# Patient Record
Sex: Male | Born: 1960 | Race: White | Hispanic: No | Marital: Married | Smoking: Former smoker
Health system: Southern US, Community
[De-identification: ages and names within clinical notes are randomized; demographics above are authoritative.]

## PROBLEM LIST (undated history)

## (undated) DIAGNOSIS — Z960 Presence of urogenital implants: Secondary | ICD-10-CM

## (undated) DIAGNOSIS — I10 Essential (primary) hypertension: Secondary | ICD-10-CM

## (undated) DIAGNOSIS — F429 Obsessive-compulsive disorder, unspecified: Secondary | ICD-10-CM

## (undated) DIAGNOSIS — F32A Depression, unspecified: Secondary | ICD-10-CM

## (undated) DIAGNOSIS — F329 Major depressive disorder, single episode, unspecified: Secondary | ICD-10-CM

## (undated) DIAGNOSIS — J189 Pneumonia, unspecified organism: Secondary | ICD-10-CM

## (undated) DIAGNOSIS — G8190 Hemiplegia, unspecified affecting unspecified side: Secondary | ICD-10-CM

## (undated) DIAGNOSIS — I82409 Acute embolism and thrombosis of unspecified deep veins of unspecified lower extremity: Secondary | ICD-10-CM

## (undated) DIAGNOSIS — E785 Hyperlipidemia, unspecified: Secondary | ICD-10-CM

## (undated) DIAGNOSIS — R569 Unspecified convulsions: Secondary | ICD-10-CM

## (undated) DIAGNOSIS — Z978 Presence of other specified devices: Secondary | ICD-10-CM

## (undated) DIAGNOSIS — K219 Gastro-esophageal reflux disease without esophagitis: Secondary | ICD-10-CM

## (undated) DIAGNOSIS — I639 Cerebral infarction, unspecified: Secondary | ICD-10-CM

## (undated) DIAGNOSIS — N319 Neuromuscular dysfunction of bladder, unspecified: Secondary | ICD-10-CM

## (undated) DIAGNOSIS — D649 Anemia, unspecified: Secondary | ICD-10-CM

## (undated) HISTORY — PX: MULTIPLE TOOTH EXTRACTIONS: SHX2053

## (undated) HISTORY — PX: EYE SURGERY: SHX253

---

## 2002-09-22 ENCOUNTER — Encounter: Admission: RE | Admit: 2002-09-22 | Discharge: 2002-09-22 | Payer: Self-pay | Admitting: General Surgery

## 2002-09-22 ENCOUNTER — Encounter: Payer: Self-pay | Admitting: General Surgery

## 2002-11-01 ENCOUNTER — Encounter (HOSPITAL_BASED_OUTPATIENT_CLINIC_OR_DEPARTMENT_OTHER): Admission: RE | Admit: 2002-11-01 | Discharge: 2003-01-16 | Payer: Self-pay | Admitting: Internal Medicine

## 2003-01-31 ENCOUNTER — Encounter (HOSPITAL_BASED_OUTPATIENT_CLINIC_OR_DEPARTMENT_OTHER): Admission: RE | Admit: 2003-01-31 | Discharge: 2003-04-11 | Payer: Self-pay | Admitting: Internal Medicine

## 2003-09-19 ENCOUNTER — Encounter (HOSPITAL_BASED_OUTPATIENT_CLINIC_OR_DEPARTMENT_OTHER): Admission: RE | Admit: 2003-09-19 | Discharge: 2003-11-09 | Payer: Self-pay | Admitting: Internal Medicine

## 2004-01-01 ENCOUNTER — Encounter (HOSPITAL_BASED_OUTPATIENT_CLINIC_OR_DEPARTMENT_OTHER): Admission: RE | Admit: 2004-01-01 | Discharge: 2004-03-31 | Payer: Self-pay | Admitting: Internal Medicine

## 2004-04-02 ENCOUNTER — Encounter (HOSPITAL_BASED_OUTPATIENT_CLINIC_OR_DEPARTMENT_OTHER): Admission: RE | Admit: 2004-04-02 | Discharge: 2004-07-01 | Payer: Self-pay | Admitting: Internal Medicine

## 2005-02-05 ENCOUNTER — Encounter (HOSPITAL_BASED_OUTPATIENT_CLINIC_OR_DEPARTMENT_OTHER): Admission: RE | Admit: 2005-02-05 | Discharge: 2005-05-06 | Payer: Self-pay | Admitting: Surgery

## 2005-05-08 ENCOUNTER — Encounter (HOSPITAL_BASED_OUTPATIENT_CLINIC_OR_DEPARTMENT_OTHER): Admission: RE | Admit: 2005-05-08 | Discharge: 2005-08-06 | Payer: Self-pay | Admitting: Surgery

## 2006-02-01 ENCOUNTER — Encounter (HOSPITAL_BASED_OUTPATIENT_CLINIC_OR_DEPARTMENT_OTHER): Admission: RE | Admit: 2006-02-01 | Discharge: 2006-04-19 | Payer: Self-pay | Admitting: Surgery

## 2006-10-01 ENCOUNTER — Encounter (HOSPITAL_BASED_OUTPATIENT_CLINIC_OR_DEPARTMENT_OTHER): Admission: RE | Admit: 2006-10-01 | Discharge: 2006-12-09 | Payer: Self-pay | Admitting: Surgery

## 2006-10-06 ENCOUNTER — Ambulatory Visit (HOSPITAL_COMMUNITY): Admission: RE | Admit: 2006-10-06 | Discharge: 2006-10-06 | Payer: Self-pay | Admitting: Surgery

## 2007-02-20 ENCOUNTER — Inpatient Hospital Stay (HOSPITAL_COMMUNITY): Admission: EM | Admit: 2007-02-20 | Discharge: 2007-03-10 | Payer: Self-pay | Admitting: Emergency Medicine

## 2007-02-20 ENCOUNTER — Ambulatory Visit: Payer: Self-pay | Admitting: Critical Care Medicine

## 2007-03-07 ENCOUNTER — Ambulatory Visit: Payer: Self-pay | Admitting: Internal Medicine

## 2007-04-07 ENCOUNTER — Ambulatory Visit: Payer: Self-pay | Admitting: Physical Medicine & Rehabilitation

## 2007-04-07 ENCOUNTER — Inpatient Hospital Stay (HOSPITAL_COMMUNITY)
Admission: RE | Admit: 2007-04-07 | Discharge: 2007-05-19 | Payer: Self-pay | Admitting: Physical Medicine & Rehabilitation

## 2007-05-02 ENCOUNTER — Ambulatory Visit: Payer: Self-pay | Admitting: Vascular Surgery

## 2007-05-02 ENCOUNTER — Encounter (INDEPENDENT_AMBULATORY_CARE_PROVIDER_SITE_OTHER): Payer: Self-pay | Admitting: Physical Medicine & Rehabilitation

## 2007-05-05 HISTORY — PX: SUPRAPUBIC CATHETER INSERTION: SUR719

## 2007-05-05 HISTORY — PX: TRACHEOSTOMY: SUR1362

## 2007-06-21 ENCOUNTER — Observation Stay (HOSPITAL_COMMUNITY): Admission: RE | Admit: 2007-06-21 | Discharge: 2007-06-22 | Payer: Self-pay | Admitting: Urology

## 2007-08-24 ENCOUNTER — Ambulatory Visit: Payer: Self-pay | Admitting: Cardiovascular Disease

## 2007-08-25 ENCOUNTER — Ambulatory Visit (HOSPITAL_COMMUNITY): Admission: RE | Admit: 2007-08-25 | Discharge: 2007-08-25 | Payer: Self-pay | Admitting: Cardiovascular Disease

## 2007-08-25 ENCOUNTER — Encounter: Payer: Self-pay | Admitting: Cardiovascular Disease

## 2007-08-25 ENCOUNTER — Ambulatory Visit: Payer: Self-pay | Admitting: Cardiovascular Disease

## 2008-07-20 IMAGING — CR DG ABD PORTABLE 1V
1 series · 1 of 1 positions shown · non-contrast
Comparison: 03/01/07.

CLINICAL DATA: Ileus. 
 PORTABLE ABDOMEN ? 1 VIEW ? 05/07/07:

[view not recorded]
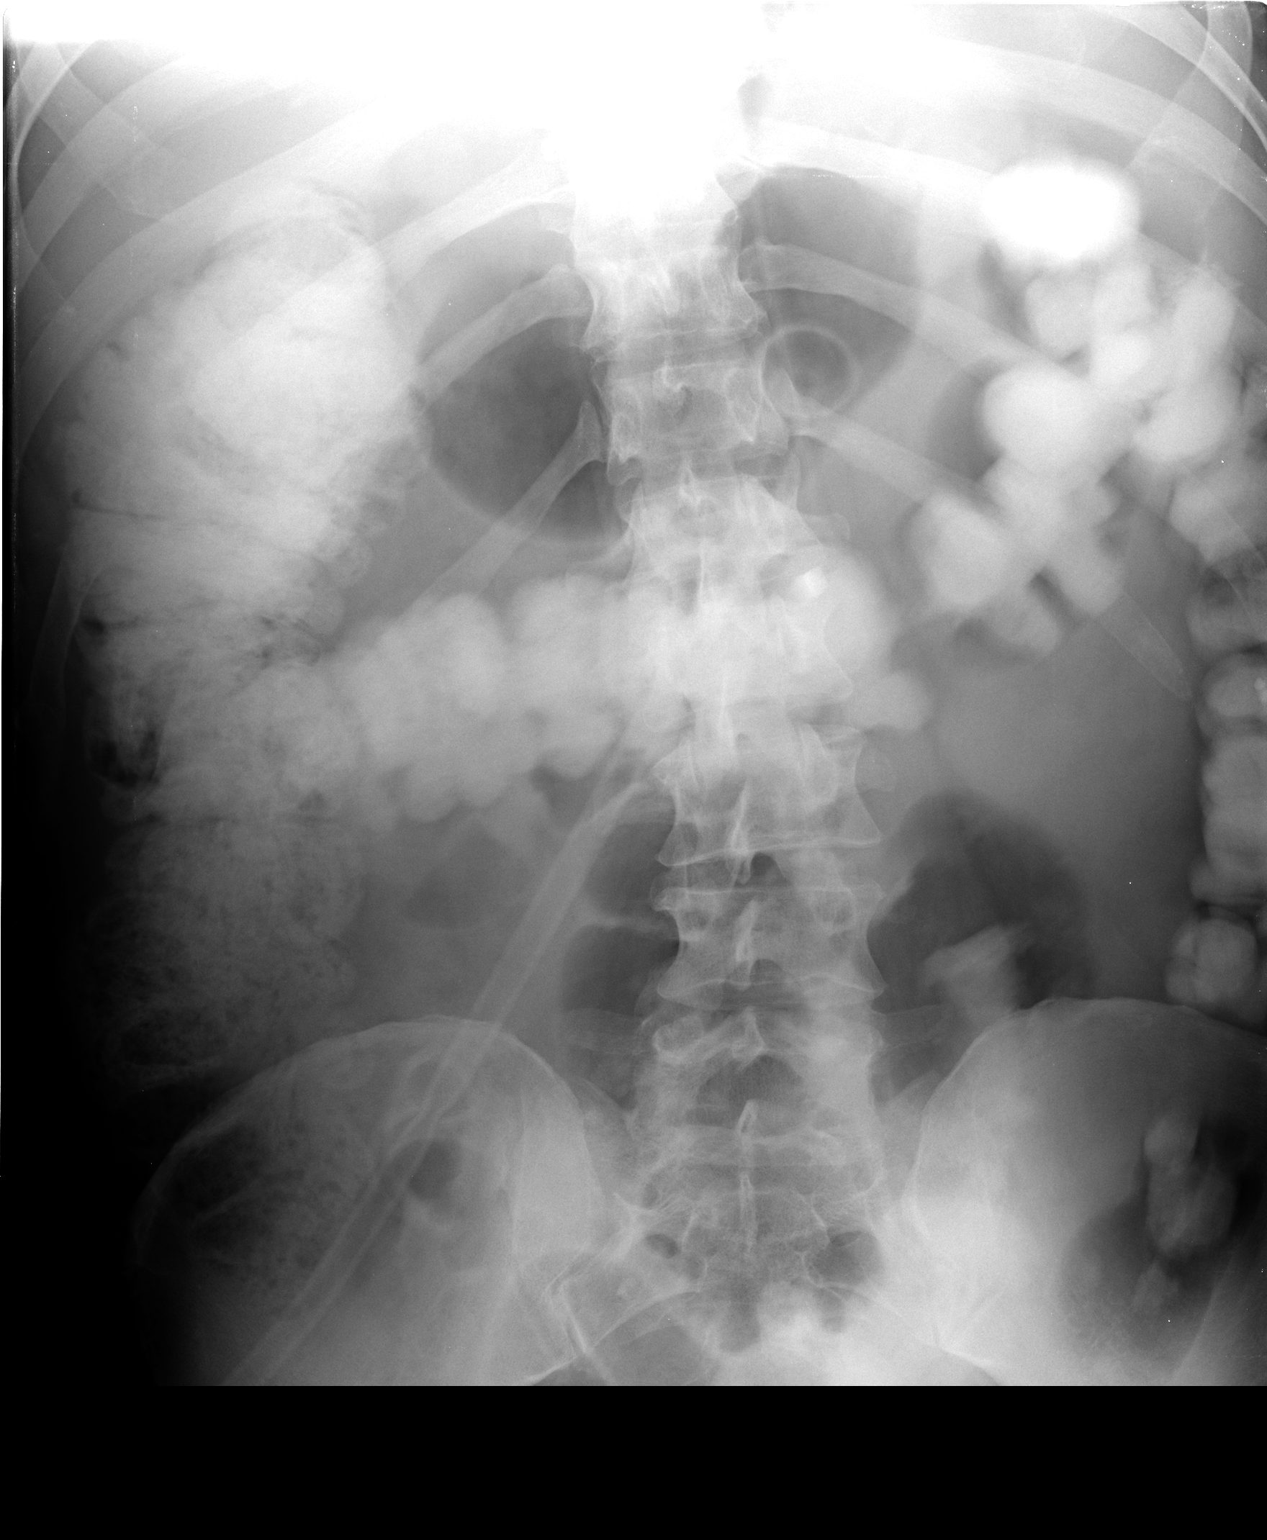

[1 of 1 positions shown; findings below may reference images not displayed]

FINDINGS: Gastrostomy tube is in place.  Oral contrast is seen throughout the colon.   No significant obstructive pattern or ileus identified.
IMPRESSION: Oral contrast in colon.  NO significant ileus or bowel obstruction.

## 2010-03-19 ENCOUNTER — Ambulatory Visit: Payer: Self-pay | Admitting: Vascular Surgery

## 2010-03-24 ENCOUNTER — Ambulatory Visit: Payer: Self-pay | Admitting: Vascular Surgery

## 2010-03-24 ENCOUNTER — Ambulatory Visit (HOSPITAL_COMMUNITY): Admission: RE | Admit: 2010-03-24 | Discharge: 2010-03-24 | Payer: Self-pay | Admitting: Vascular Surgery

## 2010-05-25 ENCOUNTER — Encounter: Payer: Self-pay | Admitting: Physical Medicine & Rehabilitation

## 2010-07-15 LAB — POCT I-STAT, CHEM 8
Calcium, Ion: 1.2 mmol/L (ref 1.12–1.32)
Creatinine, Ser: 0.9 mg/dL (ref 0.4–1.5)
Potassium: 4.2 mEq/L (ref 3.5–5.1)

## 2010-07-15 LAB — GLUCOSE, CAPILLARY
Glucose-Capillary: 215 mg/dL — ABNORMAL HIGH (ref 70–99)
Glucose-Capillary: 240 mg/dL — ABNORMAL HIGH (ref 70–99)
Glucose-Capillary: 243 mg/dL — ABNORMAL HIGH (ref 70–99)

## 2010-09-16 NOTE — Op Note (Signed)
[name], [name] NO.:  [id number]   MEDICAL RECORD NO.:  [id number]          PATIENT TYPE:  INP   LOCATION:  3108                         FACILITY:  [facility]   PHYSICIAN:  Nelda Bucks, MD DATE OF BIRTH:  1960/07/28   DATE OF PROCEDURE:  DATE OF DISCHARGE:                               OPERATIVE REPORT   PROCEDURE:  Percutaneous tracheostomy.  Consent was obtained from his  wife.  Aware of all risks and benefits associated with the procedure.   BLOOD LOSS WITH PROCEDURE:  4 mL.   BRONCHOSCOPIST FOR THE PROCEDURE:  Coralyn Helling, M.D.   VENTILATOR ADJUSTMENTS MADE:  Included taking the rate to 20 during use  of vecuronium for paralysis.  Oxygenation was increased FIO2 100%.   INDICATIONS FOR PROCEDURE:  Included 4 mg of Versed, 200 mcg of fentanyl  in divided doses, etomidate 20 mg, one-time dose  vecuronium 10 mg.   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   POSTOPERATIVE DIAGNOSIS:  Status post tracheostomy for the above  diagnosis.   PROCEDURE:  The patient was placed in a supine position.  His neck was  extended mildly with a shoulder wall.  The area was prepped with  chlorhexidine preparation over the tracheal rings.  Topical lidocaine  was used, 7 mL over the incision site.  A 1.5-cm vertical incision was  made over the third endotracheal rings.  Dissection was made on down  through the tracheal plains.  Dr. Coralyn Helling advanced the bronchoscope  through the ET tube and then pulled the ET tube back to approximately 20  cm.  Finder needle was then attempted at the third intratracheal space  and was visualized to be entering the ET tube.  The finder needle was  then withdrawn.  ET tube was taken back 1 cm to approximately 19 cm.  Then I placed an 18 gauge needle with a white sheath catheter into the  airway, which was properly visualized directly at the tip of the ET  tube.  The  catheter was then advanced without any posterior wall trauma  noted by Dr. Evlyn Courier bronchoscope.  Wire was inserted through the  catheter and the catheter sheath was removed.  A 14-French punch dilator  was then inserted over the wire and punched dilated directly into the  trachea, directly visualized without posterior wall injury.  A 14-French  dilator was then removed, followed by the addition of the Rhino dilator  to 37-French over glider.  This was done very easily.  Dilator was  removed and a glider remained over the wire.  An 8-French Shiley  tracheostomy over a 28-French dilator was inserted over the glider and  wire into the airway.  The wire and glider and 28-French dilator were  then removed and the tracheostomy remained.  The balloon was blown up  with 10 mL of air and the entire ventilatory setting and adapter were  transferred over to the new tracheostomy site and Dr. Craige Cotta entered the  new tracheostomy site with his bronchoscope and saw carina with  no active bleeding noted whatsoever.  The patient tolerated this  procedure very well.  Saturations ranging from 100 to 100%.  The patient  was normotensive throughout the procedure and his heart rate remained in  the 85s to 95 range.  Potable chest x-ray was taken and is pending for  review and family was updated about the procedure.      Nelda Bucks, MD  Electronically Signed     DJF/MEDQ  D:  03/01/2007  T:  03/02/2007  Job:  [id number]

## 2010-09-16 NOTE — H&P (Signed)
[name], [name] NO.:  [id number]   MEDICAL RECORD NO.:  [id number]          PATIENT TYPE:  IPS   LOCATION:  4025                         FACILITY:  [facility]   PHYSICIAN:  Ellwood Dense, M.D.   DATE OF BIRTH:  1960-12-13   DATE OF ADMISSION:  04/07/2007  DATE OF DISCHARGE:                              HISTORY & PHYSICAL   PRIMARY CARE PHYSICIAN:  [facility].   GI:  [place].   HISTORY OF PRESENT ILLNESS:  [name] is a 51 year old right-handed  Caucasian male with a history of non-insulin dependent diabetes mellitus  along with epilepsy.  He reportedly had been noncompliant with diabetes  management and questionably compliant with blood pressure medicines at  home.  The patient was admitted to [facility] February 20, 2007,  with an acute onset of right-sided weakness with initial BP of 205/105.  Initial cranial CT showed left basal ganglia hemorrhage with surrounding  edema, likely hypertensive in nature.  The patient was placed on  nicardipine drip.  Conservative care was recommended for the hemorrhagic  infarct.  His hospital course was significant for ventilatory-dependent  respiratory failure requiring and a trache March 01, 2007, and PEG  tube placement on that same date.  His hospital course was significant  for pneumonia, treated with antibiotics.  His latest cranial CT March 03, 2007, showed slight shift in the midline but otherwise stable.  Echocardiogram and carotid Dopplers were not performed during his  initial workup by neurology.   The patient was noted to have a hemoglobin A1c elevated at 11.8 and was  maintained on insulin therapy.  The plan is for aspirin therapy for  stroke prophylaxis approximately 1 month after discharge from the  hospital.   The patient was discharged to [facility] on March 09, 2007.  Subcu Lovenox was added for DVT prophylaxis prior to his discharge to  [facility].  He has  had a long history of diabetic foot ulcer, especially  on the right foot, and has been followed in the outpatient wound care  center prior to his stroke.  He was placed on IV vancomycin and Primaxin  along with fluconazole for methicillin-resistant Staphylococcus aureus-  positive sputum while at [facility].  His trache was discontinued April 05, 2007, and he has weaning oxygen by nasal cannula.   The patient was evaluated by the rehabilitation physicians and felt to  be an appropriate candidate for inpatient dilatation.   REVIEW OF SYSTEMS:  Positive for reflux.   PAST MEDICAL HISTORY:  1. Diabetes mellitus.  2. Diabetic foot ulcer.  3. Epilepsy.  4. Hypertension.  5. Dyslipidemia.  6. Chronic anemia.   FAMILY HISTORY:  Negative for stroke.   SOCIAL HISTORY:  The patient lives with his wife and their 36-year-old  son in [place].  They live in a 2-level home with the bedroom downstairs  with 7 steps to enter.  The patient does not use tobacco and used  alcohol occasionally.  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  He has good  family support and extended family.   FUNCTIONAL HISTORY PRIOR TO ADMISSION:  Independent and working.   ALLERGIES:  No known drug allergies.   MEDICATIONS PRIOR TO ADMISSION:  Wife to bring and from home.   LABORATORY:  Recent hemoglobin was 10.3 with hematocrit 30.1, platelet  count of 259,000, white count of 4.7.  Recent sodium was 145, potassium  3.4, chloride 106, bicarbonate 30, BUN 23 and creatinine 0.7 as of  April 04, 2007.   PHYSICAL EXAM:  A reasonably well-appearing adult male lying in bed with  very soft speech but alert and cooperative.  Blood pressure is 142/86 with pulse 77, respiratory rate 20 and  temperature 97.0.  HEENT: Normocephalic, nontraumatic.  CARDIOVASCULAR:  Regular rate and rhythm, S1-S2 without murmurs.  ABDOMEN:  Soft, slightly obese, nontender  with positive bowel sounds.  LUNGS:  Clear to auscultation bilaterally.  NEUROLOGIC:  Alert and oriented x3 with yes and no questions being  supplied.  He has a very soft voice but does have good comprehension.  He has poor oral motor abilities but slightly decreased sensation on the  right side.  Extraocular muscles appeared intact.   Left upper left and lower extremity strength were 4+/5.  Bulk and tone  were normal.  Reflexes were 2+ and symmetrical.  Right upper and right  lower extremity strength was 0-1/5 throughout.  Bulk was normal and tone  was decreased with slightly decreased sensation throughout the right  side.   IMPRESSION:  1. 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  2. Non-insulin-dependent diabetes mellitus.  3. History of epilepsy.  4. Recent history of recent ventilatory-dependent respiratory failure.  5. Severe dysphagia.   Presently the patient has deficits in ADLs, transfers, ambulation,  higher-level cognition, speech and swallowing related to the above-noted  left basal ganglia hemorrhage treated conservatively.   PLAN:  1. Admit to the rehabilitation unit rotation unit for daily therapies      to include physical therapy for range of motion, strengthening, bed      mobility, transfers, pre gait training, gait training and equipment      evaluation.  2. Occupational therapy for range of motion, strengthening, ADLs,      cognitive/perceptional training, splinting and equipment      evaluation.  3. Rehab nursing for skin care, wound care and bowel and bladder      training as necessary.  4. Speech therapy for oral motor exercises, evaluation of swallowing      and higher-level communication evaluation and treatment.  5. Case management to assess home environment, assist with discharge      planning and arrange for appropriate follow-up care.  6. Social work to assess family and social support, consultation      regarding  disability issues and assist in discharge planning.  7. Continue n.p.o. at present.  8. Check admission labs including CBC and CMET in a.m. April 08, 2007.  9. Tylenol 325 mg one to two tablets p.o. q.4h. p.r.n. for pain.  10.CBGs a.c. and h.s.  11.Draw blood from IV.  12.Routine turning to prevent skin breakdown.  13.Dulcolax suppository one per rectum daily p.r.n.  14.Keep heels off bed.  15.Check O2 saturations each shift and apply O2 for saturations less      than or equal to 90%.  16.Wound care nurse for sacral decubitus.  17.Mepilex with border to sacral ulcer, change each Monday, Wednesday  and Friday and p.r.n.  18.Keep Foley tube for now.  19.Clonazepam 0.25 mg per PEG tube daily.  20.Enalapril 10 mg per PEG daily.  21.Lantus insulin 37 units subcu q.h.s.  22.Glipizide 5 mg q.12h. per PEG.  23.Pepcid 20 mg per PEG daily.  24.Vitamin C 500 mg q.12h.  25.Valproic acid 599 mg per PEG q.6h.  26.Jevity 1.2 calories at 75 mL/hr. with bolus feeds per dietary      consult.  27.Glycopyrrolate 2 mg q.8h. per PEG tube.  28.Hand-held albuterol nebulizers 2.5 mg q.6h. as needed for shortness      of breath.  29.Dietary consult to adjust tube feedings.  30.Swallow study with speech therapy April 08, 2007.  31.Continue fentanyl patch 25 mcg per hour, change q.72h.   PROGNOSIS:  Fair.   ESTIMATED LENGTH OF STAY:  3-5 weeks.   GOALS:  Min assist ADLs, transfers, and moderate assist, ambulation,  with min assist wheelchair mobility, advancement diet as tolerated, and  improved cognition as able.           ______________________________  Ellwood Dense, M.D.     DC/MEDQ  D:  04/07/2007  T:  04/07/2007  Job:  [id number]

## 2010-09-16 NOTE — Assessment & Plan Note (Signed)
[facility]   NAME:  [name], [name]                ACCOUNT NO.:  [id number]   MEDICAL RECORD NO.:  [id number]      DATE OF BIRTH:  30-Dec-1960   PHYSICIAN:  Jake Shark A. Tanda Rockers, M.D. VISIT DATE:  11/16/2006                                   OFFICE VISIT   SUBJECTIVE:  [name] is a 50 year old man with a Wagner grade 3  diabetic foot ulcer, whom we have treated with total contact casting.  He returns for followup.  In the interim he denies excessive drainage  malodor pain or fever.   OBJECTIVE:  Blood pressure is 140/83, respirations 20, pulse rate 90,  temperature 97.7.  Capillary blood glucose was 340 mg percent.  Inspection of the right fifth met head shows that the wound is  completely resolved.  There is a thin in layer of epithelium covering  the complete wound.  There is no fluctuance.  There is no evidence of  infection.  The pedal pulse remains intact.  The neurologic exam continues to be abnormal.   ASSESSMENT:  Resolved Wagner grade 3 diabetic foot ulcer.   PLAN:  We have given the patient a prescription and referral to [facility]  for custom inserts and orthotics.  He is going directly there now for  molding.  We will reevaluate him in 2 weeks.  In the interim he is to  wear an offloading Darco healing sandal.  He will continue routine  diabetic foot monitoring as he has been previously instructed and  reviewed.  We have given the patient an opportunity to ask questions.  He seems to comprehend the fore-stated concerns and indicates that he  will be compliant.      Harold A. Tanda Rockers, M.D.  Electronically Signed     HAN/MEDQ  D:  11/16/2006  T:  11/16/2006  Job:  [id number]

## 2010-09-16 NOTE — Discharge Summary (Signed)
[name], [name]                ACCOUNT NO.:  [id number]   MEDICAL RECORD NO.:  [id number]          PATIENT TYPE:  INP   LOCATION:  3108                         FACILITY:  [facility]   PHYSICIAN:  Melvyn Novas, M.D.  DATE OF BIRTH:  09-30-1960   DATE OF ADMISSION:  02/20/2007  DATE OF DISCHARGE:  03/09/2007                               DISCHARGE SUMMARY   DIAGNOSES AT THE TIME OF DISCHARGE:  1. 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  2. Hypertension.  3. Diabetes.  4. Epilepsy since childhood with last seizure greater than 20 years      ago.  5. Hyperlipidemia.  6. Dysphagia secondary to stroke status post percutaneous endoscopic      gastrostomy.  7. Ventilator-dependent respiratory failure status post tracheotomy,      remains on ventilator at the time of discharge.  8. Hyponatremia.  9. Anemia.  10.Pneumonia status post full course of antibiotics.   MEDICINES AT TIME OF DISCHARGE:  1. Lovenox 40 mg subcutaneous daily.  2. Saline lock.  3. Peridex oral rinse twice a day while on ventilator.  4. Protonix 40 mg a day.  5. Valproate 500 mg q.6h.  6. Tylenol 650 mg q.6h.  7. Sliding scale insulin q.4h.  8. Lantus 50 units q.12h.  9. Jevity tube feeding of 80 mL an hour via PEG.  10.Normal saline at 20 mL __________  11.Free water 100 mL q.6h.   STUDIES PERFORMED:  1. CT of the head on admission shows a left basal ganglion hemorrhage      with a small amount of surrounding edema, likely hypertensive in      nature.  2. CT angiogram on admission shows interval increased size of      parenchymal hemorrhage at the left lentiform nuclei now measuring      6.1 x 2.8 cm with surrounding edema and 6.6 mm rightward shift. No      extra-axial hemorrhage or ventriculomegaly. Otherwise normal.  3. CT of the head at 24 hours shows stable hemorrhage.  4. CT of the head at 72 hours shows  stable hemorrhage.  5. CT on the 25th shows continued left basal ganglion hemorrhage with      edema and mild mass effect. Otherwise stable.  6. Last CT of the head performed on October 30 again showed slightly      increased rightward midline shift from 4.1 to 6.1 mm, otherwise      stable and normal evolutionary changes.  7. Chest x-ray with left lower lobe atelectasis, intubated on      ventilator; that was on admission, and by discharge, there was      interval increase in left retrocardiac opacity representing      atelectasis versus infiltrate, stable tubes and lines and mild      central venous pulmonary congestion similar to the prior. In the      middle of these 2 chest x-rays, there was an x-ray with pneumonia      that has cleared.  8. Abdominal x-ray performed October 27 showed orogastric tube      terminating in pylorus. No other abnormalities.  9. Two-D echocardiogram not performed.  10.Carotid Doppler not performed.   LABORATORY STUDIES:  Last CBC performed November 3 with hemoglobin 9.8,  hematocrit 29.6, white blood cells 7.2 and platelets 222. Clostridium  difficile was negative x3. Last chemistry performed on November 5 with  sodium 146, potassium 3.4, glucose 127, calcium 8.1 - otherwise normal.  Last urinalysis performed on October 30 shows 0 to 2 red blood cells, 0  to 2 white blood cells. Blood cultures October 24 showed no growth x2.  Anemia panel with red blood cells 3.7, reticulocytes 18.5, iron 27,  total iron binding capacity 1.65, percent saturation 16, UIBC 138 and  vitamin B12 618. Her folate is 13.1, ferritin 816. Last Depakote level  October 27 34.8. Hemoglobin A1c 11.9.   HISTORY OF PRESENT ILLNESS:  [name] is a 50 year old right-  handed Caucasian male who had acute onset right sided weakness at church  at approximately 6:30 p.m. the day of admission. The patient was brought  to the emergency room via EMS. CT of the brain revealed an acute  20-mm  left basal ganglion hemorrhage felt to be secondary to hypertension as  the patient has a history of hypertension, and blood pressure in the ED  was 205/105. The patient was placed on nicardipine drip and placed in  the intensive care unit. CT angiogram of the brain was performed to  assess for active hemorrhage which was negative. He was not a TPA  candidate secondary to hemorrhage.   HOSPITAL COURSE:  The patient remained in the ICU from admission until  discharge. He was initially on a nicardipine drip, but that has been  controlled. He was initially placed on his blood pressure medicine which  he had taken since March of this year per his pharmacy records, but it  eventually was not needed with the blood pressures in the 120s/60s. He  remains on the ventilator at this time. We attempted ventilator weaning.  The patient just could not secondary to the continued edema. Expect it  will take a period but will eventually be able to wean. Critical care  medicine followed him for this during hospitalization. Tracheostomy was  placed by Dr. Tyson Alias at the bedside during his hospitalization as was  a PEG placed for tube feeding by [facility]. Both of these events were  uneventful. The patient does have a significant history of diabetes with  elevated hemoglobin A1c on admission of 11.8. Glucoses were difficult to  control, being on and off insulin drips several times during the  hospital stay. He is now on 50 units of Lantus q.12h. and seems to be  much better controlled. Sliding scale insulin continues on a q.4h.  basis. The patient will eventually need aspirin for secondary stroke  prevention but typically do not start this for at least one month  following a hemorrhage of this size. As of the day of discharge, he is  safe to be placed on Lovenox subcutaneous tissue for DVT prophylaxis  post PEG and tracheostomy placement. He apparently had some diarrhea  over the weekend, and this  has resolved. His clostridium difficile were  negative x3. During the course of hospitalization, he had some pneumonia  which has been treated with a full course of antibiotics. The patient is  now stable from the neurological standpoint and is safe for discharge to  __________ which his insurance company has agreed to.   CONDITION ON DISCHARGE:  The patient is alert. He follows occasional  commands. He has a left gait preference and a right lower facial  weakness. He has dense right hemiparesis and moves his left side well.  Left lower extremity is 2/5 weak. His heart rate is regular. His breath  sounds are clear. His abdomen is nondistended, nontender.   DISCHARGE PLAN:  1. Transfer to [facility] for continuation of ventilator weaning and      rehabilitation.  2. Consider aspirin 4 weeks from hemorrhage onset for stroke      prevention.  3. Continue aggressive risk factor control including systolic blood      pressure less than 130, hemoglobin A1c less than 6.5 and LDL less      than 100.  4. Monitor sodium and adjust as needed.  5. Plan eventual discharge home with wife.  6. Follow up with Dr. Delia Heady in 2 to 3 months or as able.      Annie Main, N.P.      Melvyn Novas, M.D.  Electronically Signed    SB/MEDQ  D:  03/09/2007  T:  03/09/2007  Job:  [id number]   cc:   Pramod P. Pearlean Brownie, MD  [facility]  Critical Care Medicine

## 2010-09-16 NOTE — H&P (Signed)
[name], SHIN NO.:  [id number]   MEDICAL RECORD NO.:  [id number]          PATIENT TYPE:  INP   LOCATION:  2305                         FACILITY:  [facility]   PHYSICIAN:  Deanna Artis. Hickling, M.D.DATE OF BIRTH:  [birth year]   DATE OF ADMISSION:  02/20/2007  DATE OF DISCHARGE:                              HISTORY & PHYSICAL   TIME:  1930   CHIEF COMPLAINT:  Right hemiparesis.   HISTORY OF PRESENT ILLNESS:  The patient is a 50 year old Caucasian male  who is noted to have acute onset right-sided weakness at church at  approximately 6:30 p.m. today.  The patient was brought to the emergency  department via EMS.  CT scan of the brain at that time revealed an acute  20 mm left basal ganglia hemorrhage.   PAST MEDICAL HISTORY:  Includes:  1. Diabetes.  2. Diabetic foot ulcer.  3. Epilepsy since childhood which is well controlled with Depakote,      the last seizure was greater than 20 years ago.  4. Hypertension.  5. Hyperlipidemia.   MEDICATIONS:  Outpatient medications are unknown other than Depakote.  The patient's wife indicates that the patient is rather secretive with  regard to his medical conditions.  She does, however, indicate that she  knows he takes Depakote.   ALLERGIES:  LISTED AS NO KNOWN DRUG ALLERGIES.   FAMILY HISTORY:  Denied for a history of stroke or epilepsy according to  the patient's wife.   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  He does not consume or illicit drugs.  He does consume  alcohol on occasion.   REVIEW OF SYSTEMS:  Somewhat limited as the patient is essentially  aphasic, however, by history the patient became acutely right  hemiparetic while ambulating in church.  The was no antecedent chest  pain or shortness of breath.   PHYSICAL EXAMINATION:  GENERAL:  The patient is able to awaken to verbal  stimuli.  He does not follow any commands.  He does not appear to be in  any acute  distress.  VITAL SIGNS:  Temperature 97.3, blood pressure 205/105, heart rate in  the 80s, respiratory rate 16, O2 saturation 100% on room air.  HEENT:  The patient is normocephalic and atraumatic. Oral mucosa is  moist.  There are no tongue lacerations present.  NECK:  Supple in all planes of motions.  HEART:  Regular with a 2/6 systolic ejection murmur.  LUNGS:  Clear to auscultation bilaterally.  ABDOMEN:  Soft and nondistended.  SKIN: Without any rash.  EXTREMITIES:  Without any edema or cyanosis.  NEUROLOGIC:  The patient's eyes are initially closed.  He does awaken to  verbal stimuli.  At times he makes incomprehensible sounds.  He will not  follow any commands.  His pupils are 3 mm on the left and 4mm on the  right. The right pupil is nonreactive. The right pupil is unreactive.  The left pupil is somewhat ovoid in shape.  The patient does have roving  eye movements.  No gaze  preference is identified.  The patient with  cough and gag reflex.  Corneals are intact bilaterally.  With noxious  stimulation the patient has flexor posturing of the right upper  extremity and trace movement in the right lower extremity.  He does  withdraw to noxious stimulus on the left and localizes briskly  especially with the left upper extremity.  Spontaneous movements are  also appreciated in the left upper and lower extremities.  Reflexes are  2-/4 in the left upper extremity specifically with the biceps and  brachioradialis. On the right, the patient has 2/4 reflexes.  Babinski  response is downgoing bilaterally.  Sensory exam reveals the patient to  not withdraw to pin stimulation to either lower extremity, however, this  may be related to a potential diabetic neuropathy.   IMAGING:  CT imaging of the brain performed in the emergency department  without IV contrast revealed a left basal ganglial hemorrhage of  approximately 20 mL.  There was minimal midline shift appreciated at  that time.  There  is no intraventricular hemorrhage identified.  There  is no evidence of acute hydrocephalus at this time.   LABORATORY DATA:  Sodium 139, potassium 4.1, chloride 105, CO2 29, BUN  16, creatinine 0.6, glucose 256, white blood cell count 4.5, hemoglobin  13.3, hematocrit 38.9, platelet count of 150.  PT 12.6, INR 0.9, PTT 23.   IMPRESSION:  1. The patient is a 50 year old Caucasian male noted to have a      spontaneous left basal ganglial hemorrhage approximately 20 mL with      minimal midline shift.  Neuro exam reveals somnolent nonverbal      patient with right pupillary dilatation which is nonreactive,      suspect hypertension as primary etiology.  Less likely underlying      vascular abnormality.  2. History of epilepsy, well controlled.  3. Other medical conditions including diabetes, diabetic foot ulcer,      hypertension, hyperlipidemia.   PLAN:  1. Will start nicardipine to maintain systolic blood pressure less      than 160.  2. Arterial line for continuous blood pressure monitoring.  3. CT angiogram of the brain to assess for active hemorrhage as well      as to assess for underlying vascular abnormality.  4. Will provided Depacon 500 mg IV times 1 as it is unclear as to when      the patient last took his Depakote.  Additionally it is unclear as      to the dose of the patient's medication.  5. The patient's wife to bring him list of patient's home meds.  6. Will have detailed family discussion.  7. Will admit the patient to the intensive care unit following CT      angiogram results.  8. Repeat CT Brain without IV contrast in AM      Suzzette Righter, DO   Electronically Signed     ______________________________  Deanna Artis. Sharene Skeans, M.D.    RH/MEDQ  D:  02/21/2007  T:  02/21/2007  Job:  [id number]

## 2010-09-16 NOTE — Discharge Summary (Signed)
[name], [name] NO.:  [id number]   MEDICAL RECORD NO.:  [id number]          PATIENT TYPE:  IPS   LOCATION:  4030                         FACILITY:  [facility]   PHYSICIAN:  Mariam Dollar, P.A.  DATE OF BIRTH:  05/03/1961   DATE OF ADMISSION:  04/07/2007  DATE OF DISCHARGE:  04/17/2008                               DISCHARGE SUMMARY   DISCHARGE DIAGNOSES:  1. Left basal ganglia hemorrhagic infarction.  2. Dysphagia secondary to cerebrovascular accident.  3. Gastrostomy feeding tube for nutritional support.  4. Insulin-dependent diabetes mellitus.  5. Tracheostomy - decannulated.  6. Epilepsy.  7. Hypertension.  8. Sacral and buttocks pressure ulcers - resolved.  9. Nonocclusive deep vein thrombosis common femoral vein right lower      extremity and subcutaneous Lovenox for deep vein thrombosis      prophylaxis   HISTORY OF PRESENT ILLNESS:  This is a 50 year old right-handed white  male with history of non-insulin dependent diabetes mellitus and  epilepsy who was admitted [facility] February 20, 2007 with  right-sided weakness.  Blood pressure 205, diastolic 105.  Cranial CT  scan showed a left basal ganglia hemorrhage with surrounding edema,  likely hypertensive in nature.  Placed on nicardipine drip.  Conservative care of hemorrhagic infarction.  Hospital course with  respiratory failure.  Tracheostomy performed October 28 as well as  gastrostomy feeding tube.  Complete a course of antibiotics for  pneumonia.  Latest follow-up cranial CT scan October 30 showed slight  midline shift but otherwise stable.  Echocardiogram, carotid Dopplers  not performed during workup by Neurology.  Noted hemoglobin A1c of 11.8,  maintained on insulin therapy.  Plan was for aspirin therapy for CVA  prophylaxis in one month.  The patient was discharged to [facility] March 09, 2007.  Subcutaneous Lovenox was initiated for deep  vein thrombosis prophylaxis.   Patient with long history of diabetic foot  ulcers had been followed at outpatient wound center in the past.  He had  been placed on intravenous vancomycin for MRSA while at [facility].  Tracheostomy removed April 05, 2007 and weaned from oxygen  therapy.   PAST MEDICAL HISTORY:  1. Diabetes mellitus with poor compliance.  2. Diabetic foot ulcers.  3. Epilepsy.  4. Hypertension.  5. Hyperlipidemia.  6. Chronic anemia.   HABITS:  Denies tobacco.  Occasional alcohol.   SOCIAL HISTORY:  Lives with his wife and 57-year-old son in [place].  Two-  level home, bedroom downstairs.  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   MEDICATIONS PRIOR TO ADMISSION:  Unknown.   ALLERGIES:  None.   REHABILITATION HOSPITAL COURSE:  The patient was admitted to inpatient  rehab services with therapies initiated on a 3-hour daily basis  consisting of physical therapy, occupational therapy, speech therapy and  rehabilitation nursing.  The following issues were addressed during the  patient's rehabilitation stay.  Pertaining to Mr. Vanderloop left basal  ganglia hemorrhagic infarction, remained stable.  Aspirin therapy had  since been initiated 81 mg daily at the recommendations of  neurology  services.  Functionally, he was moderate to max assist for sliding board  basic transfers, minimal assist for dynamic sitting.  He was aphasic but  was able to communicate his needs better during his rehabilitation stay.  He was able to use yes/no as well as two to three word responses for  basic naming.  He was minimum to moderate assist for activities of daily  living.  He remained on subcutaneous Lovenox for deep vein thrombosis  prophylaxis throughout his rehab course.  Noted on December 29 Doppler  studies of lower extremities did show a nonocclusive deep vein  thrombosis in the common femoral vein.  No change was made to his  medications as he did remain on subcutaneous Lovenox  but only at time of  discharge and would continue with aspirin therapy.  He was followed by  speech therapy for dysphagia.  Gastrostomy tube feeds were ongoing.  His  diet was slowly advanced to a mechanical soft thin liquid diet.  He was  still getting PEG tube feeds at 0600 and 2200 hours and water per PEG  tube had since been stopped.  Blood sugars were monitored.  He is  continued on Lantus insulin as well as Glucotrol.  Latest blood sugars  158, 98 and 96.  He did have a history of epilepsy.  He remained on  valproate 500 mg every 6 hours.  Blood pressures controlled with Vasotec  10 mg daily.  The patient did have some ongoing discomfort of Foley tube  with some mild erosion at the penis.  Urology services was consulted.  Initial attempts to remove Foley catheter tube, and the patient did not  void.  Later a coude catheter was placed which did seem to aid in his  overall comfort as this was anchored down tightly as not to cause any  further irritation.  Due to his limited functional status.  The fact  that his wife continued to work, it was felt skilled nursing facility  would be needed with bed becoming available on May 19, 2007, and  discharge taking place at that time.   DISCHARGE MEDICATIONS:  At time of dictation included:  1. Vasotec 10 mg p.o. daily.  2. Pepcid 20 mg p.o. daily.  3. Vitamin C 500 mg p.o. b.i.d.  4. Robinul 2 mg p.o. q.8 hours.  5. Duragesic patch 25 mcg change every 72 hours.  6. Aspirin 81 mg p.o. daily.  7. Glipizide 10 mg p.o. b.i.d.  8. Lantus insulin 34 units subcutaneously at bedtime.  9. Darvocet N-100 one tablet every 6 hours as needed pain.  10.Valproate 500 mg p.o. every 6 hours.  11.Lantus insulin was to be at 14 units subcutaneously every a.m. as      well as the 34 units at bedtime dictated earlier.  12.His subcutaneous Lovenox was discontinued at time of discharge.      Noted follow-up at time of discharge by      speech therapy that  his diet had since been advanced to a regular      consistency, thus all tube feeds were discontinued as well as any      water by PEG tube other than for routine flushing of tube.  It was      felt that if his nutritional support remained good that is peg tube      could be removed.  He should be on a diabetic diet.      Daniel Angiulli, P.A.  DA/MEDQ  D:  05/18/2007  T:  05/18/2007  Job:  [id number]   cc:   Ellwood Dense, M.D.  [facility]  [facility]

## 2010-09-16 NOTE — Op Note (Signed)
[name], [name]                ACCOUNT NO.:  [id number]   MEDICAL RECORD NO.:  [id number]          PATIENT TYPE:  OBV   LOCATION:  A301                          FACILITY:  [facility]   PHYSICIAN:  Ky Barban, M.D.DATE OF BIRTH:  [birth year]   DATE OF PROCEDURE:  06/21/2007  DATE OF DISCHARGE:                               OPERATIVE REPORT   PREOPERATIVE DIAGNOSIS:  Neurogenic bladder.   POSTOPERATIVE DIAGNOSIS:  Neurogenic bladder.   PROCEDURE:  Insertion of suprapubic catheter.   ANESTHESIA:  General.   PROCEDURE:  The patient under general anesthesia after usual prep and  drape, suprapubic incision about 2 inches long in the midline is made,  carried down through the subcutaneous tissue.  The rectus sheath was  incised.  The recti separated in the midline.  The retropubic space was  entered and self-retaining Turner Warwick retractor was applied.  The  bladder was filled up through the Foley catheter which already has 300  mL of saline.  Inferior surface of the bladder is identified.  Bladder  was aspirated with #18 needle to confirm the position of the bladder.  Then two holding stitches using 2-0 Vicryl were placed in the anterior  wall of the bladder.  In between these two holding stitches, incision  with the help of the cautery was made, bladder was opened and #26 Foley  catheter is inserted as a cystotomy tube.  The margins of the cystotomy,  they were anchored to the rectus sheath on each side with already placed  these stitches on each side.  The rectus sheath was closed with  interrupted sutures of 0-0 Vicryl and after passing the holding stitches  of cystotomy with a free needle into the rectus sheath they were also  tied so the bladder is pulled directly against the rectus sheath.  Once  it was closed, the prevesical space was drained with quarter inch  Penrose drain.  Skin was closed with staples.  Cystotomy tube stabilized  to the skin with a 0-0 silk stitch.   Sterile gauze dressing applied  after stapling the wound.  The Foley catheter was removed.  The patient  left the operating room in satisfactory condition.      Ky Barban, M.D.  Electronically Signed     MIJ/MEDQ  D:  06/21/2007  T:  06/22/2007  Job:  [id number]   cc:   Prescott Parma  Fax: [phone]

## 2010-09-16 NOTE — Assessment & Plan Note (Signed)
[facility]   NAME:  ALIC, HILBURN NO.:  [id number]   MEDICAL RECORD NO.:  [id number]      DATE OF BIRTH:  [birth year]   PHYSICIAN:  Maxwell Caul, M.D. VISIT DATE:  11/26/2006                                   OFFICE VISIT   Mr. Astorga is a 50 year old man with a Wagner's grade 3 diabetic foot  ulcer who had been treated with total contact casting.  When he was last  seen here the wound essentially resolved.  He was sent back to [facility]  for custom inserts and adjustment of his orthotics.  We are seeing him  today in follow-up.   EXAMINATION:  He is afebrile.  His blood sugar was 266 today.  The area  over his right fifth metatarsal head is completely closed over.  There  is some mild callus over this, however no open area was seen.  I have  looked at his shoes, they seem to be quite adequate.  There is good  width here, I think everything is fine.   PLAN:  I have told him to apply Allevyn adhesive or protective area to  help with any friction that still was present within his orthotics and  his orthotic shoes, otherwise think he is safe for discharge.  He can be  followed up for his diabetes at his family practice.  There are no open  wounds here.  He inspects his feet daily now.           ______________________________  Maxwell Caul, M.D.     MGR/MEDQ  D:  11/26/2006  T:  11/26/2006  Job:  [id number]

## 2010-09-16 NOTE — Assessment & Plan Note (Signed)
[facility]   NAME:  [name], [name]                ACCOUNT NO.:  [id number]   MEDICAL RECORD NO.:  [id number]      DATE OF BIRTH:  May 07, 1960   PHYSICIAN:  Jake Shark A. Tanda Rockers, M.D. VISIT DATE:  11/02/2006                                   OFFICE VISIT   .   SUBJECTIVE:  [name] is a 50 year old man whom we are evaluating with  a Wagner grade 3 diabetic foot ulcer.  In the interim he has worn a  total contact cast.  He presents for reevaluation.   OBJECTIVE:  Blood pressure is 140/88, respirations 16, pulse rate 95,  temperature is 98.1.  The patient has not checked his capillary blood  glucose since his last visit.  Inspection of the right fifth met head shows that there is complete  granulation overlying the previous ulcer.  The wound does not extend to  the joint at this time there is no excessive edema.  There is no  evidence of a send cellulitis or abscess.  A Q-tip was used to debride  an area of hypergranulation.  Hemorrhage was controlled with direct  pressure.   ASSESSMENT:  Clinical improvement with total contact casting for  offloading.   PLAN:  We will place the patient in a total contact cast with a  Silverlon swath and a SofSorb.  We will reevaluate him in 2 weeks.  We  have encouraged the patient to resume his diabetic management, including  capillary blood glucose monitoring and treatment per his internist.  In  addition, we have suggested that he make an appointment with his primary  care physician at [facility].      Harold A. Tanda Rockers, M.D.  Electronically Signed     HAN/MEDQ  D:  11/02/2006  T:  11/02/2006  Job:  [id number]   cc:   Paulita Cradle

## 2010-09-16 NOTE — Assessment & Plan Note (Signed)
[name]                       [name] NOTE   NAME:[name], [name]                       MRN:          [id number]  DATE:08/24/2007                            DOB:          1960/11/09    [name] was seen today at the request of [name].  He is 50 years  old.  Unfortunately, he suffered a hemorrhagic stroke in October of this  past year.  It happened while he was at church.  Apparently, there was  an angiogram done, and he had no aneurysm.  Unfortunately, it has been a  devastating stroke.  He has dysphasia.  Had a gastrostomy tube until  recently.  He has a suprapubic catheter.  Had a tracheostomy.  He has  had seizures on therapy with a sacral decubitus and a nonocclusive DVT  to the right lower extremity.   We are asked to see him for fluctuating blood pressures during OT and  PT.  I explained to the wife that this is fairly common after a CVA.  Particularly now, it appears that the patient has some diabetes.  His  postural reflexes are slowed from his stroke and autonomic dysfunction  from his diabetes.  I told her there is no easy solution to this.  Apparently, his blood pressure really has not been high since the  stroke.  She tells me that it shot up to over 200, and they thought he  had a hypertensive hemorrhagic stroke.  However, at rehab, it does not  appear to be that bad.  The issue has to do with getting up and having  his blood pressure fall to the 80 systolic level and having him feel  tired and weak.   I told her for the time being we would cut his Vasotec back to 5 mg a  day and then have him stop it in 2 weeks.  So long as his systolic  pressures stay under 150, I do not think that he needs any blood  pressure pills.   The patient's review of systems is remarkable for no significant chest  pain.  He has not had a history of cardiomyopathy or other heart  problems.  There has been no previous problems in  regards to postural  hypotension until he had a CVA.   The patient's activities are limited.  He is very dependent on his wife.   He had hemoglobin A1c at the time of this CVA of 11.18 and has been on  therapy since then.   The patient's review of systems otherwise negative.   His past medical history is remarkable for:  1. Hypertension.  2. Seizures.  3. CVA in October.  4. Diabetes.  5. Hypercholesterolemia.  6. Dysphasia.  7. Previous tracheostomy.   He has no known allergies.   Social: The patient is married.  He has been married for 22 years.  He  has an 74-year-old son named [name].  The patient is currently getting  rehab, living at [place].  He used to work in a Air traffic controller.  He  has an indwelling suprapubic  catheter.   His medications include:  1. Vasotec 10 mg day to be stopped.  2. Vitamin C.  3. Aspirin.  4. Darvocet.  5. Lantus 14 in the morning, 34 subcutaneous at bedtime.  6. Duragesic patch.  7. Pepcid.  8. Robinul 2 mg q.8h.  9. Glipizide 10 a day.  10.Depakote 500 q.6h.   FAMILY HISTORY:  Is noncontributory.   PHYSICAL EXAMINATION:  Is remarkable for an unfortunate young white male  who has had a previous CVA.  He has difficulty holding his head up.  He  has a facial droop.  He talks inconsistently.  He has left-sided  weakness.  He has significant pain due to his left hip being externally  rotated.  His blood pressure is 110/70 sitting, and standing with help  from his wife, it only drop to 100 systolic.  Heart rate was 87 and  increased to 95.  He is afebrile, respiratory rate 16.  HEENT:  Unremarkable.  No carotid bruits.  No lymphadenopathy, thyromegaly, JVP elevation.  LUNGS:  Were cleared with good diaphragmatic motion.  No wheezing.  S1-S2 with normal heart sounds.  PMI normal.  ABDOMEN:  Protuberant.  Previous gastrostomy tube site is well-healed.  He had a suprapubic catheter in.  Distal pulses are intact with no edema.   NEUROLOGICAL:  As described.  He has muscular weakness in lower  extremities and left arm as well as neck muscles.   EKG is normal.   IMPRESSION:  1. Postural hypotension secondary to cerebral vascular accident and      dysautonomia diabetes.  Keep well hydrated.  Stop Vasotec.      Tolerate systolic blood pressures as high as 150-160.  Wear support      stockings as much as possible.  Try to make weight transfers and      change in position as slow as possible, given the fact that he had      a hemorrhagic stroke from hypertension,  I would not want to start      Florinef or Midodrine on the patient.  2. Previous hypertensive hemorrhagic stroke, currently not      hypertensive.  Follow blood pressures closely with OT and PT.  3. Suprapubic catheter.  I suspect [name] is doing surveillance      cultures from time to time.  I am not sure if there are plans to      get this taken out.  4. Nutrition.  Seems a bit dehydrated today.  This will not help in      regards to his postural symptoms.  Would make sure the nutritionist      and dietician follow his intake and outputs and daily weight.   We will do a 2-D echocardiogram to make sure that he does not have a  significant cardiomyopathy contributing to any of his postural  hypotension.   However, I suspect it is secondary to his CVA and diabetes.     [name]. [name], MD, [facility]  Electronically Signed    PCN/MedQ  DD: 08/24/2007  DT: 08/24/2007  Job #: [id number]

## 2010-09-16 NOTE — Consult Note (Signed)
NEW PATIENT CONSULTATION   [name], [name]  DOB:  1961-04-02                                       03/19/2010  ZOXWR#:[id number]   NOTE:  Dictation code N5.   REASON FOR CONSULTATION:  Nonhealing wound of the right foot.   HISTORY:  This is a pleasant 50 year old gentleman who developed a wound  on the plantar aspect of his right foot approximately 3 months ago.  He  is being treated aggressively the wound care center by Dr. Tanda Rockers and  there has been no significant improvement in is wound over the last 2  months.  He is being considered for hyperbaric oxygen.  He has no pedal  pulses and he was sent for a vascular consultation.   The patient sustained a stroke associated with right-sided hemiplegia 3  years ago and has been nonambulatory.  He gives no history of  claudication.  He has had no rest pain, although he does note that he  has poor feeling in the right leg.  He has had no previous history of  nonhealing wounds that he is aware of.   PAST MEDICAL HISTORY:  Significant for non-insulin-dependent diabetes,  hypertension and history of a previous left brain stroke.  He denies any  history of hypercholesterolemia, history of previous myocardial  infarction, history of congestive heart failure or history of COPD.   PAST SURGICAL HISTORY:  Significant for previous tracheostomy and PEG  after his stroke.  He also had a suprapubic catheter placed at that time  and still has a suprapubic catheter.   SOCIAL HISTORY:  He is married.  He has 1 child.  He briefly smoked  cigarettes for 6 months but does not currently smoke.  He does not drink  alcohol on a regular basis.   FAMILY HISTORY:  There is no history of premature cardiovascular  disease.   REVIEW OF SYSTEMS:  GENERAL:  He has had no recent weight loss, weight  gain or problems with his appetite.  CARDIOVASCULAR:  He has had no chest pain, chest pressure, palpitations  or arrhythmias.  He has had a  previous left brain stroke as described  above.  He had a previous DVT, he cannot remember on what side.  This  was at the same time of his stroke.  NEUROLOGIC:  He did have seizures when he was in high school but has had  no recent seizures.  ENT:  He did have cataract surgery on the right eye recently.  GI, pulmonary, hematologic, GU, musculoskeletal, psychiatric review of  systems is unremarkable.  This is documented on the medical history form  in his chart.   PHYSICAL EXAMINATION:  This is a pleasant 50 year old gentleman who  appears his stated age.  Blood pressure is 133/86, heart rate is 96,  saturation 95%, temperature 97.9.  HEENT:  Unremarkable.  Lungs are  clear bilaterally to auscultation without rales, rhonchi or wheezing.  On cardiovascular exam I do not detect any carotid bruits.  He has a  regular rate and rhythm.  He has palpable femoral pulses and palpable  popliteal pulses.  I cannot palpate pedal pulses on either side.  Abdomen is soft and nontender with normal-pitched bowel sounds.  He is  somewhat obese and I cannot assess for an aneurysm.  Musculoskeletal  exam:  There are no major  deformities or cyanosis.  Neurologic:  He has  a right hemiplegia.  Skin:  He has a wound on the plantar aspect of his  right foot with currently no significant drainage.  There is also a  small wound on the lateral aspect of the right fifth metatarsal.  He has  a small wound on the lateral aspect of the distal third of his right  leg.  Lymphatic exam:  There is no significant cervical, inguinal or  axillary lymphadenopathy.   I have reviewed his lab studies in his records sent from the wound care  center.  They have been aggressively treating this Wagner grade 3  diabetic ulcer and recently have been using a silver alginate dressing  with no significant improvement.  They have also been doing offloading  with a Tree surgeon.  He is being considered for hyperbaric oxygen.   Lab  evaluation shows that he did have a wound culture that grew Proteus  at 1 point and he has been on Bactrim and doxycycline.  His creatinine  is normal at 0.65.   I have reviewed his Doppler study from February 03, 2010.  This showed an  ankle-brachial index of 99% on the right and 95% on the left.  He was  noted have minimal thickening with calcific plaque in the lower  extremity arteries.  He was noted to have monophasic flow below the  right knee.  I have also reviewed his x-ray, which was an MRI of the  foot which showed no evidence of osteomyelitis or abscess in the right  foot.  This was on January 22, 2010.   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  Given that the wounds are nonhealing, I have  recommend that we proceed with arteriography to assess his infrainguinal  circulation.  If the circulation is reasonable, then I think hyperbaric  oxygen would be a very reasonable choice to try to get these wounds to  heal.  If he has significant infrainguinal arterial occlusive disease,  we will consider revascularization pending the results of his  arteriogram.  I have discussed the option of proceeding with angioplasty  at the time of his arteriogram if we find disease amenable to  angioplasty.  We have discussed the indications for arteriography and  the potential complications including but not limited to bleeding,  arterial injury (risk 1%-2%), renal insufficiency or other unpredictable  medical problems.  We have also discussed the potential risks of  angioplasty.  He is agreeable to proceed and his procedure has been  scheduled for March 24, 2010.  We will make further recommendations  pending these results.     Di Kindle. Edilia Bo, M.[name].  Electronically Signed   CSD/MEDQ  [name]:  03/19/2010  T:  03/20/2010  Job:  [id number]   cc:   Jake Shark A. Tanda Rockers,  M.[name].

## 2010-09-16 NOTE — Consult Note (Signed)
[name], [name]                ACCOUNT NO.:  [id number]   MEDICAL RECORD NO.:  [id number]          PATIENT TYPE:  AMB   LOCATION:  DAY                           FACILITY:  [facility]   PHYSICIAN:  Ky Barban, M.D.DATE OF BIRTH:  1960-06-09   DATE OF CONSULTATION:  DATE OF DISCHARGE:                                 CONSULTATION   CHIEF COMPLAINT:  Neurogenic bladder.   HISTORY:  A 50 year old gentleman was seen by me in the office recently.  Last year he had a stroke in October 2008.  He has developed neurogenic  bladder and ever since then he has a Foley catheter that is causing a  lot of problems.  He has also developed penile abscess and has a  draining sinus in the penis.  The catheter is cutting through his distal  urethra and I have recommended that we go ahead and do a cystotomy tube  to let this area heal up.  So he is coming as outpatient, will undergo  insertion of a suprapubic tube under anesthesia as outpatient.   PAST MEDICAL HISTORY:  1. He has a history of stroke, 2008.  2. Diabetes.  3. Hypertension.  4. History of seizures since childhood but had no seizure for 20      years.  He is on Depakote.   PERSONAL HISTORY:  He does not smoke or drink.   REVIEW OF SYSTEMS:  Otherwise unremarkable.   EXAMINATION:  Moderately-built, not in acute distress, sitting in a  wheelchair.  Able to communicate.  Blood pressure 108/72, temperature is 96.6.  CENTRAL NERVOUS SYSTEM:  No sensation below his umbilicus, but he is  able to move his upper extremities fine.  Able to talk.  A permanent bag inside his epigastric area, which has been removed since  I have seen him.  External genitalia:  There is a Foley catheter in place.  There is lost  of the distal one inch of the urethra and also has a draining sinus on  the penile shaft.  Testicles are normal.   IMPRESSION:  1. Neurogenic bladder.  2. Diabetes.  3. Hypertension.  4. Urinary tract infection.  5. Penile  abscess, draining sinus.   PLAN:  Suprapubic cystotomy under anesthesia, keep him for observation  overnight.      Ky Barban, M.D.  Electronically Signed     MIJ/MEDQ  D:  06/20/2007  T:  06/21/2007  Job:  [id number]

## 2010-09-16 NOTE — Op Note (Signed)
NAMETOREZ, BEAUREGARD                ACCOUNT NO.:  [id number]   MEDICAL RECORD NO.:  [id number]          PATIENT TYPE:  INP   LOCATION:  3108                         FACILITY:  [facility]   PHYSICIAN:  Coralyn Helling, MD        DATE OF BIRTH:  Jan 29, 1961   DATE OF PROCEDURE:  03/01/2007  DATE OF DISCHARGE:                               OPERATIVE REPORT   PROCEDURE:  Bronchoscopic assistance for percutaneous tracheostomy  placement.   PREOPERATIVE DIAGNOSIS:  Intracerebral hemorrhage with upper airway  instability and need for tracheostomy placement.   POSTOPERATIVE DIAGNOSIS:  Intracerebral hemorrhage with upper airway  instability and need for tracheostomy placement.   Mr. Granja is a 50 year old male who was admitted for an intracerebral  hemorrhage.  He has been on a ventilator since admission.  He was  extubated on October 27 but then shortly after this had difficulty  maintaining his airway and managing his secretions.  He was reintubated  and decision was made to proceed with tracheostomy placement. The  procedure was explained to the patient's wife who signed the consent  form.  For details about the percutaneous tracheostomy see accompanying  note from Dr. Nelda Bucks on this date.  The patient was  preoxygenated to 100% on the ventilator.  He was initially given Versed  and fentanyl and then Etomidate for sedation and analgesia.  He was then  given vecuronium as a paralytic agent.  The bronchoscope was entered  through size 8 endotracheal tube.  The finder needle was visualized  entering into the trachea.  The guidewire was then visualized.  The  dilator and then the tracheostomy tube was then visualized at the entry  into the trachea.  After this the bronchoscope was withdrawn and entered  through the tracheostomy tube and the tracheostomy tube appeared to be  in good position.  Then the airways were inspected.  The right upper,  middle lobe and lower lobe orifices were  visualized and did not appear  to have any obvious endobronchial lesions.  The left upper lingular and  lower lobe orifices was then visualized and there was no obvious  endobronchial lesions there.  There was no obvious signs of bleeding.  The bronchoscope was then withdrawn.  A postprocedure chest x-ray is  pending.      Coralyn Helling, MD  Electronically Signed     VS/MEDQ  D:  03/01/2007  T:  03/02/2007  Job:  [id number]

## 2010-09-16 NOTE — Assessment & Plan Note (Signed)
[facility]   NAME:  [name]                ACCOUNT NO.:  [id number]   MEDICAL RECORD NO.:  [id number]      DATE OF BIRTH:  11/22/1960   PHYSICIAN:  Jake Shark A. Tanda Rockers, M.D. VISIT DATE:  10/19/2006                                   OFFICE VISIT   SUBJECTIVE:  [name] is a 50 year old diabetic with a Wagner grade III  ulcer involving his right fifth metatarsal head.  In the interim, he has  been treated with antibiotics and off-loading utilizing a total contact  cast.  He denies interim fever, excessive drainage, or malodor.   OBJECTIVE:  Blood pressure is 106/67, respirations are 20, pulse rate  90, temperature is 98.7.  Inspection of the right foot shows that there  is healthy appearing granulation without evidence of ascending  infection.  A Q-tip probe was used and extends down to the metatarsal  head, but there is no exposed bone or cartilage.  There is no evidence  of ischemia.  There is no evidence of an abscess.  The edema is  reasonably controlled.   ASSESSMENT:  Clinical improvement with effective off-loading.   PLAN:  We will keep the patient on antibiotics, and we will off-load him  with a total contact cast for two weeks.  We will reevaluate him.  We  will proceed with hyperbaric oxygen treatment as soon as the  precertification approval is received.      Harold A. Tanda Rockers, M.D.  Electronically Signed     HAN/MEDQ  D:  10/19/2006  T:  10/19/2006  Job:  [id number]

## 2010-09-16 NOTE — Assessment & Plan Note (Signed)
[facility]   NAME:  [name]                ACCOUNT NO.:  [id number]   MEDICAL RECORD NO.:  [id number]      DATE OF BIRTH:  1961-02-07   PHYSICIAN:  Jake Shark A. Tanda Rockers, M.D. VISIT DATE:  10/11/2006                                   OFFICE VISIT   SUBJECTIVE:  [name] is a 50 year old man with a Wagner grade 3  diabetic foot ulcer.  During his last visit, he had a culture and  underwent several lab studies in preparation for HBO.  He was treated  with Septra DS 1 p.o. b.i.d. and doxycycline 100 mg p.o. b.i.d.  He was  offloaded with a healing sandal.  He returns for followup and to have  TCOM determination.  There has been no interim fever or pain.   OBJECTIVE:  Blood pressure is 135/78, respirations 18, pulse rate 87,  temperature is not recorded.   The inspection of the right fifth metatarsal head shows that the ulcer  has contracted, but mild Q-tip probing extends down to the metatarsal  head without difficulty.  There is moderate drainage.  There is no  hyperemia.  The pedal pulse remains readily palpable.  The TCOM is  normal.  Chest x-ray is normal.  The BMET is normal.  The  electrocardiogram is normal.  CBC is normal.   ASSESSMENT:  Wagner grade 3 diabetic foot ulcer.   PLAN:  We will continue antibiotics.  We will add total contact casting  with a Silverlon swathe and a SofSorb.  The patient will begin his diet  schedule.  We will begin hyperbaric oxygen as soon as the diet schedule  permits.  In the interim, we will continue to evaluate him on a weekly  basis.      Harold A. Tanda Rockers, M.D.  Electronically Signed     HAN/MEDQ  D:  10/11/2006  T:  10/11/2006  Job:  [id number]

## 2010-09-16 NOTE — Assessment & Plan Note (Signed)
[facility]   NAME:  JHAIR, WITHERINGTON                ACCOUNT NO.:  [id number]   MEDICAL RECORD NO.:  [id number]      DATE OF BIRTH:  05-30-1960   PHYSICIAN:  Jake Shark A. Tanda Rockers, M.D. VISIT DATE:  10/04/2006                                   OFFICE VISIT   REPORT TYPE:  Clinic note   SUBJECTIVE:  Mr. Lento is 50 year old diabetic who was last seen in the  clinic March 11, 2006.  At that time, he had completely healed  diabetic foot ulcer involving the fifth metatarsal head of the right  foot.  The patient was discharged to procure and to wear custom inserts.  Five weeks ago, he noted an ulceration over the volar aspect of the foot  in the same area as previously.  He was seen by his primary care  physician who instituted local care.  He has been seen multiple  occasions.  He has not required antibiotics.  He has had 1 debridement.  He has been off-loaded, utilizing his inserts.  He is referred because  the wound continues to show little to no response to 4 weeks of care.  He denies excessive drainage, malodor, pain or fever.  His capillary  blood glucoses have been reasonable.  He continues to be ambulatory.  He  continues to work.   OBJECTIVE:  VITAL SIGNS:  Blood pressure is 140/86, respirations 16,  temperature is 98.3, pulse rate of 91.  Capillary blood glucose is 170  mg%.  EXTREMITIES:  Inspection of the right foot shows that there is a large  penetrating ulcer over the fifth met head, extending into the  metatarsophalangeal joint.  This wound was cultured.  A rather large  callus was sharply debrided, contained nonviable tissue.  The  granulation was hypertrophied and was easily friable.  The dorsalis  pedis pulse is readily palpable bilaterally.  There is trace edema on  the left, 1+ edema on the right.  There is no evidence of ascending  lymphangitis.   IMPRESSION:  Wager grade 3 diabetic foot ulcer.   PLAN:  We started the patient on doxycycline 100  mg orally b.i.d. with  Septra DS one orally b.i.d.  We will place him in an off-loading healing  sandal and an Aquacel with silver dressing, re-evaluate him in 4 days.  We will also order the pre-hyperbaric oxygen therapy laboratory panels  to include a CBC, a basic metabolic profile, hemoglobin A1C, a  transcutaneous oxygen determination, PA and lateral chest film, and an  EKG.  We will proceed with HBO as the dive schedule permits.   We have given the patient an opportunity to ask questions.  He seems to  understand that this is a more advanced wound than what we have treated  in the past because it extends into the joint and is grossly infected.  The patient appreciates the likelihood of major limb loss.  He expresses  gratitude for having been seen in the clinic at this time.      Harold A. Tanda Rockers, M.D.  Electronically Signed     HAN/MEDQ  D:  10/04/2006  T:  10/04/2006  Job:  [id number]

## 2010-09-19 NOTE — Assessment & Plan Note (Signed)
[facility]   NAME:  [name], WAYMIRE NO.:  [id number]   MEDICAL RECORD NO.:  [id number]      DATE OF BIRTH:  1960/06/28   PHYSICIAN:  Maxwell Caul, M.D. VISIT DATE:  02/12/2006                                     OFFICE VISIT   PURPOSE OF TODAY'S VISIT:  Followup of a diabetic foot ulcer over the right  fifth metatarsal head.  He has been treated with Prisma, Darco sandal wedge  heeling for offloading.  He had been treated with Septra empirically because  of the appearance of the wound, although this was discontinued last time.  Since his last visit, he denies fever, drainage or malodor.   WOUND EXAM:  The wound on the fifth metatarsal head actually looks  remarkably improved.  Current measurements are 0.9 x 0.5 x 0.1.  There is  healthy granulation with a good rim of epithelialization starting.  There  was no slough and no evidence of current infection.   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  His vascular  supply is good.  There is no evidence of infection here.  I think we are  well on the way to healing.   PLAN:  We will continue with the Prisma, Darco sandal with a felt, to reduce  pressure on this area.  He will be seen again in follow up in 1 week's time.           ______________________________  Maxwell Caul, M.D.     MGR/MEDQ  D:  02/12/2006  T:  02/12/2006  Job:  [id number]

## 2010-09-19 NOTE — Assessment & Plan Note (Signed)
[facility]   NAME:  DEMARQUEZ, CIOLEK                ACCOUNT NO.:  [id number]   MEDICAL RECORD NO.:  [id number]      DATE OF BIRTH:  July 14, 1960   PHYSICIAN:  Jake Shark A. Tanda Rockers, M.D. VISIT DATE:  02/03/2006                                     OFFICE VISIT   SUBJECTIVE:  [name] is a 50 year old diabetic who returns for evaluation  of an ulceration on his right lateral foot.  The patient apparently had  healed back in February of 2007 and was wearing a custom shoe and insert.  He has not had the inserts readjusted since that time.  He continues under  the care of Dr. Morrie Sheldon at [facility].   Several days ago he noted breakdown of his lateral right foot and made an  appointment to be seen.  He has not had fever.  He has not complained of  pain.  His blood sugars have been running high.   OBJECTIVE:  Blood pressure 180/100, respirations 18, pulse rate 78 and he is  afebrile.  Capillary blood glucose is 189 mg percent.  Inspection of the  right lower extremity shows that there is 2 to 3+ edema with chronic changes  of stasis including hyperpigmentation.  The skin is shiny.  There is a fresh  abrasion on the lateral aspect of the anterior tibialis area proximal to the  ankle.  On the volar aspect of the right foot and overlying the head of the  fifth metatarsal there is a penetrating Wagner grade II ulceration with a  heavy halo of callus.  There is necrotic tissue at the base of the  ulceration.  The foot is relatively insensate.  Pedal pulses are easily  palpable.  A full thickness debridement with excision of the halo of callus  as well as necrotic tissue was performed with a sharp technique with  hemorrhage control with direct pressure.  The wound was cultured.   IMPRESSION:  Wagner grade III diabetic foot ulcer.   PLAN:  We have dressed the patient with a SelectSilver Uni flow dressing,  placed a 4 x4, Kerlix and an Ace bandage to help  maintain compression.  We  have started the patient on Septra DS one p.o. b.i.d.  We have instructed  him to return to the clinic in three to four days for re-evaluation.  We  will check his cultures and change his antibiotics as appropriate.   We have emphasized the necessity to maximize his glucose control and we  strongly encouraged him to make appointments to be seen by his primary care  physician in the near future.  The patient seems to understand.  We have  given him an opportunity to ask questions.  He indicates that he will comply  with all of the above recommendations.           ______________________________  Theresia Majors Tanda Rockers, M.D.     [name]  D:  02/03/2006  T:  02/04/2006  Job:  [id number]   cc:   Alfredia Client, M.D.

## 2010-09-19 NOTE — Consult Note (Signed)
NAME:  LATERRIAN, HEVENER                          ACCOUNT NO.:  [id number]   MEDICAL RECORD NO.:  [id number]                   PATIENT TYPE:  REC   LOCATION:                                       FACILITY:   PHYSICIAN:  Jonelle Sports. Sevier, M.D.              DATE OF BIRTH:  01-09-1961   DATE OF CONSULTATION:  11/09/2002  DATE OF DISCHARGE:                                   CONSULTATION   REFERRING PHYSICIAN:  Dr. Angelia Mould. Derrell Lolling.   HISTORY:  This 50 year old white male is seen at the courtesy of Dr. Derrell Lolling  for assistance with management of a persisting ulcer of the right lower  extremity.   The patient apparently was first diagnosed with diabetes some four years ago  and has been rather casual in his management of the disease.  He requires  oral agents alone, is not currently checking blood sugars, has no idea what  a hemoglobin A1c may have been and has had no formal diabetes instruction.   With that background history, the patient says that he began about two  months ago with a blister underlying the first metatarsal head on the right.  This was opened by his wife and antibiotic ointment was applied.  Apparently, this advanced to deeper ulceration and the patient was referred  October 12, 2002 to Dr. Claud Kelp.  He debrided the wound and started the  patient on seven days' worth of Augmentin 875 mg b.i.d.  He subsequently  debrided the wound one additional time.  The patient recently has been using  Betadine soaks and dry dressings.  The wound has remained crusted and has  not healed.  An x-ray done in late May of this year showed no evidence of  osteomyelitis.  He is here today for our further evaluation and assistance.   PAST MEDICAL HISTORY:  The patient has Tourette's syndrome, hypertension and  hypercholesterolemia, in addition to his diabetes.   ALLERGIES:  He has no known medicinal allergies.   MEDICATIONS:  His medications include Uniretic, Avandia, Glucotrol XL,  Glucophage and Depakote.   PHYSICAL EXAMINATION:  EXTREMITIES:  Examination today is limited to the  distal lower extremities.  The patient's feet are without gross deformity or  significant edema.  Skin temperatures are slightly higher on the right foot  as compared with the left but remain within normal limits.  There is slight  clawing of the toes bilaterally.  All pulses are palpable and there is hair  growth on the toes.  Monofilament testing shows that he lacks protective  sensation on the toes and metatarsal head areas but that it is preserved  more proximally in the foot.  There are several small plantar calluses, none  of which need immediate attention, but underlying the first metatarsal head  on the right lower extremity is a hemorrhagic callus with central ulceration  and malodorous drainage.  There is no spreading erythema, no gross  suggestion of deep infection of the foot.   IMPRESSION:  Neuropathic ulcer, plantar aspect, right first metatarsal head  area.   DISPOSITION:  1. The patient is given instruction regarding diabetes by video with nurse     and physician reinforcement.  The patient is encouraged to avail himself     of the classes available at Dr. Kathi Der office and to make every effort     to get his diabetes under better control.  2. The patient's wound is sharply debrided full-thickness with some removal     of subcutaneous fat and even small amounts of muscle as well.  Once the     wound is debrided, there is no evidence of deep penetration or deep     infection.  The post-debridement size is approximately 4.0 x 2.5 cm.  3. We had a discussion with the patient that we feel total contact casting     would be the most effective and appropriate way to get this wound quickly     healed.  In that he has driven himself here today and this is the right     lower extremity, our plans are simply to dress the wound today with     Neosporin and Bactroban and have him  return tomorrow morning with a     driver for purposes of application of total contact cast.  4. The patient is advised that we think it will take approximately one month     total to heal this wound and that he will require special inserts for his     shoes subsequently to prevent recurrences.   The patient is accepting of these recommendations and accordingly will  return here tomorrow for cast application.                                               Jonelle Sports. Cheryll Cockayne, M.D.    RES/MEDQ  D:  11/09/2002  T:  11/10/2002  Job:  [id number]   cc:   Ernestina Penna, M.D.  [address] [place]  [place] [address]  Fax: [phone]   Angelia Mould. Derrell Lolling, M.D.  [address], [address]  [place]  [place] [address]  Fax: [phone]

## 2010-09-19 NOTE — Assessment & Plan Note (Signed)
[facility]   NAME:  [name], [name] NO.:  [id number]   MEDICAL RECORD NO.:  [id number]      DATE OF BIRTH:  [birth year]   PHYSICIAN:  Maxwell Caul, M.D. VISIT DATE:  02/19/2006                                     OFFICE VISIT   SUBJECTIVE:  [name] returns today for followup of his diabetic  neuropathic ulcer over his right 5th metatarsal head.  He had been treated  with Prisma Darco sandal for offloading.  He had completed a course of  Septra last week.  Since then, he denies any fever, drainage or malodor.   He tells me that the wound looked very tiny last night, and then he was out  walking in the grass.  He got the wound wet, and then it had a macerated  appearance.   WOUND EXAM:  The whole entire area was actually covered with a thick callus,  which was partially thickness debrided extensively.  Fortunately under this,  there is a very tiny wound, which is almost 100% epithelialized.  There was  no evidence of sluff, drainage, or infection.   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  We will continue  his Darco sandal.  I will give him a leave-in pad to further protect this  area.  There is no need for any further Prisma.  We will follow him again at  one week's time.  I am hopeful that he will have a full healing at that  time.           ______________________________  Maxwell Caul, M.D.     MGR/MEDQ  D:  02/19/2006  T:  02/20/2006  Job:  [id number]

## 2010-09-19 NOTE — Assessment & Plan Note (Signed)
[facility]   NAME:  [name], Frank                ACCOUNT NO.:  [id number]   MEDICAL RECORD NO.:  [id number]      DATE OF BIRTH:  Jun 10, 1960   PHYSICIAN:  Jake Shark A. Tanda Rockers, M.D.      VISIT DATE:                                     OFFICE VISIT   SUBJECTIVE:  [name] returns for follow-up of a right diabetic foot ulcer.  In the interim he has been offloaded with a Darco wedge.  He reports no  pain, fever, hives, cyst, or drainage.   Objective:  Blood pressure is 160/80, respirations 14, and pulse rate 68.  He is afebrile.  Capillary blood glucose is 132 mg%.  Inspection of the  wound shows that there is a near-complete reepithelialization.  There is a  small area of epithelium which has overlapped.  There is a small less than  millimeter break in the skin.  There is no cellulitis.  Minimum edema.   Impression:  Near-resolved wound.   Plan:  We will continue the patient in Darco shoe for two weeks.  We have  instructed him to continue to proceed with the orthotics fitting for the  custom inserts and extra-depth shoes.  We have given him a prescription for  the same.  He is to continue the antibiotics previously prescribed until  they are exhaust.           ______________________________  Theresia Majors. Tanda Rockers, M.D.     Cephus Slater  D:  02/25/2006  T:  02/26/2006  Job:  [id number]

## 2010-09-19 NOTE — Assessment & Plan Note (Signed)
[facility]   NAME:  YOSGAR, DEMIRJIAN NO.:  [id number]   MEDICAL RECORD NO.:  [id number]      DATE OF BIRTH:  Apr 26, 1961   PHYSICIAN:  Maxwell Caul, M.D.      VISIT DATE:                                     OFFICE VISIT   PURPOSE OF TODAY'S VISIT:  Mr. Lewallen returns today in followup of his  diabetic foot ulcer on the fifth metatarsal head of his right foot.  He has  been off loaded with a Darco wedge.   EXAM:  He continues to do very well.  His blood pressure was slightly  elevated today at 160/80.  He has no open area.  In this area, he appeared  to have 100% healing.  Everything appears to be very stable here.   MANAGEMENT PLAN & GOAL:  I have discharged him back to his diabetic  footwear.  He has an appointment on February 16, 2006, for his custom inserts  fitting for his shoes.  I think we can go back to his usual footwear and  inserts until that time.  I have cautioned him about being observant of his  feet.  He seems to understand this.  He will get back to his primary doctor  in this clinic should there be further problems.           ______________________________  Maxwell Caul, M.D.     MGR/MEDQ  D:  03/11/2006  T:  03/12/2006  Job:  [id number]

## 2010-09-19 NOTE — Assessment & Plan Note (Signed)
[facility]   NAME:  [name], [name]                ACCOUNT NO.:  [id number]   MEDICAL RECORD NO.:  [id number]      DATE OF BIRTH:  [birth year]   PHYSICIAN:  Jake Shark A. Tanda Rockers, M.D. VISIT DATE:  02/08/2006                                     OFFICE VISIT   SUBJECTIVE:  [name] is a 50 year old man who returns for followup of a  diabetic foot ulcer involving his right fifth met head.  In the interim, he  had been treated with select silver 4 x 4 Kerlix Ace and a Darco wedge heel  __________ for off loading.  He had been started also on Septra DS  empirically because of the appearance of wound erythema and a suggestion of  infection.  Since his last visit, he denies fever or excessive drainage.   OBJECTIVE:  VITAL SIGNS:  Blood pressure is 180/80, respirations of 18,  pulse rate 68, and he is afebrile.  EXTREMITIES:  Inspection of the right foot shows that the wound is  dramatically better.  There is 100% granulating wound with some easy  friability.  There is no erythema.  No is no evidence of infection at this  point.  Review of his culture shows that there were no predominant organisms  and no staph was isolated.  This was the final report.   ASSESSMENT:  Improvement of the wound.  No evidence of active infection.   PLAN:  We are discontinuing his Septra DS.  We will continue off loading  with a Darco wedge __________  off loading strips.  We will also place him  in a doubled Prisma silver matrix dressing with hydrogel and re-evaluate him  in 4 days.           ______________________________  Theresia Majors Tanda Rockers, M.D.     Cephus Slater  D:  02/08/2006  T:  02/09/2006  Job:  [id number]

## 2011-01-22 LAB — URINALYSIS, MICROSCOPIC ONLY
Nitrite: NEGATIVE
Specific Gravity, Urine: 1.013
Urobilinogen, UA: 2 — ABNORMAL HIGH
pH: 6.5

## 2011-01-22 LAB — URINE CULTURE
Colony Count: 1000
Colony Count: 9000
Special Requests: POSITIVE

## 2011-01-22 LAB — OCCULT BLOOD X 1 CARD TO LAB, STOOL
Fecal Occult Bld: NEGATIVE
Fecal Occult Bld: NEGATIVE

## 2011-01-23 LAB — URINALYSIS, ROUTINE W REFLEX MICROSCOPIC
Bilirubin Urine: NEGATIVE
Hgb urine dipstick: NEGATIVE
Nitrite: NEGATIVE
Specific Gravity, Urine: >1.03 — ABNORMAL HIGH
Urobilinogen, UA: 0.2
pH: 5.5

## 2011-01-23 LAB — CBC
Hemoglobin: 11.3 — ABNORMAL LOW
RBC: 3.78 — ABNORMAL LOW

## 2011-01-23 LAB — BASIC METABOLIC PANEL
Calcium: 9.6
Creatinine, Ser: 0.75
GFR calc Af Amer: >60
GFR calc non Af Amer: >60
Sodium: 134 — ABNORMAL LOW

## 2011-01-23 LAB — URINE MICROSCOPIC-ADD ON

## 2011-01-23 LAB — DIFFERENTIAL
Basophils Absolute: 0
Basophils Relative: 0
Lymphocytes Relative: 31
Monocytes Absolute: 0.3
Monocytes Relative: 6
Neutro Abs: 2.7
Neutrophils Relative %: 62

## 2011-02-06 LAB — URINALYSIS, ROUTINE W REFLEX MICROSCOPIC
Glucose, UA: NEGATIVE
Glucose, UA: NEGATIVE
Ketones, ur: 15 — AB
Leukocytes, UA: NEGATIVE
Nitrite: NEGATIVE
Protein, ur: NEGATIVE
Specific Gravity, Urine: 1.017
Urobilinogen, UA: 1
pH: 6

## 2011-02-06 LAB — DIFFERENTIAL
Eosinophils Absolute: 0
Eosinophils Relative: 0
Lymphs Abs: 1.2
Monocytes Relative: 8

## 2011-02-06 LAB — URINE CULTURE
Colony Count: 10000
Colony Count: NO GROWTH

## 2011-02-06 LAB — ABO/RH: ABO/RH(D): O NEG

## 2011-02-06 LAB — CBC
HCT: 18.4 — ABNORMAL LOW
MCHC: 33
MCHC: 33.4
MCHC: 33.5
MCV: 81.1
MCV: 81.2
Platelets: 233
Platelets: 236
RBC: 2.27 — ABNORMAL LOW
RBC: 2.7 — ABNORMAL LOW
WBC: 5.3
WBC: 6.6

## 2011-02-06 LAB — CROSSMATCH
ABO/RH(D): O NEG
Antibody Screen: NEGATIVE

## 2011-02-06 LAB — URINE MICROSCOPIC-ADD ON

## 2011-02-06 LAB — CULTURE, BLOOD (ROUTINE X 2)

## 2011-02-06 LAB — CULTURE, ROUTINE-ABSCESS

## 2011-02-09 LAB — COMPREHENSIVE METABOLIC PANEL
ALT: 19
AST: 14
CO2: 36 — ABNORMAL HIGH
Calcium: 8.6
GFR calc Af Amer: >60
GFR calc non Af Amer: >60
Sodium: 148 — ABNORMAL HIGH

## 2011-02-09 LAB — URINALYSIS, ROUTINE W REFLEX MICROSCOPIC
Bilirubin Urine: NEGATIVE
Glucose, UA: 100 — AB
Hgb urine dipstick: NEGATIVE
Nitrite: NEGATIVE
Protein, ur: NEGATIVE
Specific Gravity, Urine: 1.021
pH: 5
pH: 7.5

## 2011-02-09 LAB — DIFFERENTIAL
Basophils Absolute: 0
Basophils Relative: 0
Eosinophils Absolute: 0 — ABNORMAL LOW
Eosinophils Relative: 1

## 2011-02-09 LAB — URINE CULTURE
Colony Count: NO GROWTH
Culture: NO GROWTH

## 2011-02-09 LAB — CBC
HCT: 26.8 — ABNORMAL LOW
MCV: 81.8
Platelets: 196
RDW: 15.2

## 2011-02-10 LAB — CLOSTRIDIUM DIFFICILE EIA
C difficile Toxins A+B, EIA: NEGATIVE
C difficile Toxins A+B, EIA: NEGATIVE
C difficile Toxins A+B, EIA: NEGATIVE

## 2011-02-10 LAB — BASIC METABOLIC PANEL
BUN: 20
BUN: 21
CO2: 30
CO2: 31
Calcium: 7.9 — ABNORMAL LOW
Calcium: 8 — ABNORMAL LOW
Chloride: 108
Chloride: 110
Chloride: 110
Creatinine, Ser: 0.67
GFR calc non Af Amer: >60
GFR calc non Af Amer: >60
GFR calc non Af Amer: >60
Glucose, Bld: 115 — ABNORMAL HIGH
Glucose, Bld: 188 — ABNORMAL HIGH
Glucose, Bld: 212 — ABNORMAL HIGH
Potassium: 3.4 — ABNORMAL LOW
Potassium: 4.1
Sodium: 145
Sodium: 145
Sodium: 146 — ABNORMAL HIGH
Sodium: 146 — ABNORMAL HIGH

## 2011-02-10 LAB — CBC
HCT: 29.6 — ABNORMAL LOW
Hemoglobin: 9.8 — ABNORMAL LOW
Hemoglobin: 9.8 — ABNORMAL LOW
Hemoglobin: 9.9 — ABNORMAL LOW
MCHC: 33.1
MCHC: 33.5
MCV: 86.5
MCV: 86.9
Platelets: 214
Platelets: 222
RDW: 12.9
RDW: 13.4
RDW: 13.5
WBC: 7.2

## 2011-02-10 LAB — DIFFERENTIAL
Basophils Absolute: 0
Basophils Relative: 0
Lymphocytes Relative: 19
Neutro Abs: 4.2
Neutrophils Relative %: 72

## 2011-02-11 LAB — BASIC METABOLIC PANEL
BUN: 14
BUN: 26 — ABNORMAL HIGH
CO2: 26
CO2: 29
CO2: 30
CO2: 30
CO2: 33 — ABNORMAL HIGH
Calcium: 8.1 — ABNORMAL LOW
Calcium: 8.2 — ABNORMAL LOW
Calcium: 8.3 — ABNORMAL LOW
Calcium: 8.4
Chloride: 110
Chloride: 115 — ABNORMAL HIGH
Chloride: 116 — ABNORMAL HIGH
Chloride: 116 — ABNORMAL HIGH
Chloride: 116 — ABNORMAL HIGH
Creatinine, Ser: 0.5
Creatinine, Ser: 0.7
Creatinine, Ser: 0.75
Creatinine, Ser: 0.82
Creatinine, Ser: 0.9
GFR calc Af Amer: >60
GFR calc Af Amer: >60
GFR calc Af Amer: >60
GFR calc Af Amer: >60
GFR calc Af Amer: >60
GFR calc Af Amer: >60
GFR calc non Af Amer: >60
GFR calc non Af Amer: >60
GFR calc non Af Amer: >60
GFR calc non Af Amer: >60
GFR calc non Af Amer: >60
Glucose, Bld: 102 — ABNORMAL HIGH
Glucose, Bld: 233 — ABNORMAL HIGH
Glucose, Bld: 240 — ABNORMAL HIGH
Glucose, Bld: 273 — ABNORMAL HIGH
Potassium: 3 — ABNORMAL LOW
Potassium: 3.3 — ABNORMAL LOW
Potassium: 3.4 — ABNORMAL LOW
Potassium: 3.6
Potassium: 3.9
Potassium: 4.1
Sodium: 147 — ABNORMAL HIGH
Sodium: 147 — ABNORMAL HIGH
Sodium: 151 — ABNORMAL HIGH
Sodium: 151 — ABNORMAL HIGH
Sodium: 156 — ABNORMAL HIGH

## 2011-02-11 LAB — IRON AND TIBC
Iron: 27 — ABNORMAL LOW
UIBC: 138

## 2011-02-11 LAB — DIFFERENTIAL
Basophils Relative: 0
Eosinophils Absolute: 0
Eosinophils Absolute: 0.1
Eosinophils Relative: 1
Lymphocytes Relative: 19
Lymphocytes Relative: 32
Lymphs Abs: 0.7
Lymphs Abs: 1.2
Lymphs Abs: 1.5
Monocytes Absolute: 0.3
Monocytes Absolute: 0.6
Monocytes Relative: 10
Monocytes Relative: 5
Monocytes Relative: 7
Neutro Abs: 4.9
Neutrophils Relative %: 76
Neutrophils Relative %: 76

## 2011-02-11 LAB — CBC
HCT: 29 — ABNORMAL LOW
HCT: 34.3 — ABNORMAL LOW
HCT: 35.9 — ABNORMAL LOW
HCT: 38.9 — ABNORMAL LOW
HCT: 38.9 — ABNORMAL LOW
Hemoglobin: 10.6 — ABNORMAL LOW
Hemoglobin: 11.1 — ABNORMAL LOW
Hemoglobin: 12.3 — ABNORMAL LOW
Hemoglobin: 13.3
Hemoglobin: 13.4
Hemoglobin: 9.6 — ABNORMAL LOW
MCHC: 33.1
MCHC: 33.1
MCHC: 33.8
MCHC: 34.1
MCHC: 34.3
MCHC: 34.4
MCV: 84.3
MCV: 84.5
MCV: 85.6
MCV: 86.4
MCV: 86.6
Platelets: 127 — ABNORMAL LOW
Platelets: 155
Platelets: 174
RBC: 3.34 — ABNORMAL LOW
RBC: 3.46 — ABNORMAL LOW
RBC: 3.69 — ABNORMAL LOW
RBC: 4.07 — ABNORMAL LOW
RBC: 4.11 — ABNORMAL LOW
RBC: 4.27
RDW: 13.2
RDW: 13.3
RDW: 13.3
WBC: 4.5
WBC: 5.7
WBC: 5.9
WBC: 6.2
WBC: 6.3

## 2011-02-11 LAB — POCT I-STAT 3, ART BLOOD GAS (G3+)
Acid-Base Excess: 1
Bicarbonate: 23.1
Bicarbonate: 27.6 — ABNORMAL HIGH
O2 Saturation: 100
Patient temperature: 98.6
TCO2: 24
pCO2 arterial: 34.1 — ABNORMAL LOW
pH, Arterial: 7.438
pO2, Arterial: 148 — ABNORMAL HIGH
pO2, Arterial: 529 — ABNORMAL HIGH

## 2011-02-11 LAB — BLOOD GAS, ARTERIAL
Acid-Base Excess: 4.6 — ABNORMAL HIGH
Acid-Base Excess: 4.8 — ABNORMAL HIGH
Bicarbonate: 23.7
Bicarbonate: 28.8 — ABNORMAL HIGH
Drawn by: 280971
FIO2: 0.3
MECHVT: 0.6
MECHVT: 650
Mode: POSITIVE
O2 Content: 4
O2 Content: 5
O2 Saturation: 98.2
O2 Saturation: 99.5
PEEP: 5
PEEP: 5
PEEP: 5
Patient temperature: 100
Patient temperature: 98.6
Patient temperature: 99.3
Pressure support: 10
RATE: 12
TCO2: 24.6
TCO2: 26.3
TCO2: 28.9
pCO2 arterial: 30 — ABNORMAL LOW
pCO2 arterial: 37.4
pCO2 arterial: 42.7
pCO2 arterial: 43.9
pH, Arterial: 7.457 — ABNORMAL HIGH
pH, Arterial: 7.488 — ABNORMAL HIGH
pH, Arterial: 7.502 — ABNORMAL HIGH
pH, Arterial: 7.512 — ABNORMAL HIGH
pO2, Arterial: 117 — ABNORMAL HIGH
pO2, Arterial: 79.9 — ABNORMAL LOW

## 2011-02-11 LAB — CULTURE, BLOOD (ROUTINE X 2): Culture: NO GROWTH

## 2011-02-11 LAB — URINALYSIS, ROUTINE W REFLEX MICROSCOPIC
Bilirubin Urine: NEGATIVE
Bilirubin Urine: NEGATIVE
Glucose, UA: >1000 — AB
Ketones, ur: NEGATIVE
Leukocytes, UA: NEGATIVE
Nitrite: NEGATIVE
Protein, ur: 30 — AB
Protein, ur: NEGATIVE
Urobilinogen, UA: 0.2

## 2011-02-11 LAB — COMPREHENSIVE METABOLIC PANEL
ALT: 15
ALT: 17
AST: 17
Albumin: 3.3 — ABNORMAL LOW
Alkaline Phosphatase: 109
BUN: 14
CO2: 22
CO2: 25
Calcium: 8.3 — ABNORMAL LOW
Calcium: 8.4
Calcium: 8.6
Creatinine, Ser: 0.62
Creatinine, Ser: 0.62
GFR calc Af Amer: >60
GFR calc non Af Amer: >60
GFR calc non Af Amer: >60
Glucose, Bld: 216 — ABNORMAL HIGH
Glucose, Bld: 286 — ABNORMAL HIGH
Potassium: 3.3 — ABNORMAL LOW
Sodium: 136
Sodium: 138
Total Protein: 6.2

## 2011-02-11 LAB — I-STAT 8, (EC8 V) (CONVERTED LAB)
Acid-Base Excess: 1
BUN: 16
Chloride: 105
HCT: 40
Hemoglobin: 13.6
Operator id: 151321
Potassium: 4.1
Sodium: 139
pCO2, Ven: 50.2 — ABNORMAL HIGH

## 2011-02-11 LAB — RAPID URINE DRUG SCREEN, HOSP PERFORMED
Benzodiazepines: NOT DETECTED
Cocaine: NOT DETECTED
Opiates: NOT DETECTED
Tetrahydrocannabinol: NOT DETECTED

## 2011-02-11 LAB — RETICULOCYTES
RBC.: 3.7 — ABNORMAL LOW
Retic Count, Absolute: 18.5 — ABNORMAL LOW

## 2011-02-11 LAB — VALPROIC ACID LEVEL
Valproic Acid Lvl: 47 — ABNORMAL LOW
Valproic Acid Lvl: 68.5

## 2011-02-11 LAB — VITAMIN B12: Vitamin B-12: 618 (ref 211–911)

## 2011-02-11 LAB — URINE MICROSCOPIC-ADD ON

## 2011-02-11 LAB — CK TOTAL AND CKMB (NOT AT ARMC): CK, MB: 2.2

## 2011-02-11 LAB — URINE CULTURE: Culture: NO GROWTH

## 2011-02-11 LAB — POCT I-STAT CREATININE
Creatinine, Ser: 0.6
Operator id: 151321

## 2011-02-11 LAB — PROTIME-INR: Prothrombin Time: 12.6

## 2011-02-11 LAB — CULTURE, BAL-QUANTITATIVE W GRAM STAIN

## 2011-02-11 LAB — HEMOGLOBIN A1C: Hgb A1c MFr Bld: 11.9 — ABNORMAL HIGH

## 2011-02-11 LAB — TROPONIN I: Troponin I: 0.01

## 2011-02-19 LAB — HEMOGLOBIN A1C: Hgb A1c MFr Bld: 11.6 — ABNORMAL HIGH

## 2011-02-19 LAB — CBC
HCT: 39.8
Hemoglobin: 13.9
MCHC: 34.9
MCV: 83.1
Platelets: 195
RDW: 12.9

## 2011-02-19 LAB — BASIC METABOLIC PANEL
BUN: 12
CO2: 27
GFR calc non Af Amer: >60
Glucose, Bld: 467 — ABNORMAL HIGH
Potassium: 4.7

## 2011-02-19 LAB — DIFFERENTIAL
Basophils Absolute: 0
Basophils Relative: 0
Eosinophils Absolute: 0
Eosinophils Relative: 1
Lymphocytes Relative: 24
Monocytes Absolute: 0.3

## 2011-04-01 ENCOUNTER — Other Ambulatory Visit (HOSPITAL_BASED_OUTPATIENT_CLINIC_OR_DEPARTMENT_OTHER): Payer: Self-pay | Admitting: Internal Medicine

## 2011-04-01 DIAGNOSIS — B999 Unspecified infectious disease: Secondary | ICD-10-CM

## 2011-04-02 ENCOUNTER — Other Ambulatory Visit (HOSPITAL_BASED_OUTPATIENT_CLINIC_OR_DEPARTMENT_OTHER): Payer: Self-pay | Admitting: Internal Medicine

## 2011-04-02 ENCOUNTER — Ambulatory Visit (HOSPITAL_COMMUNITY)
Admission: RE | Admit: 2011-04-02 | Discharge: 2011-04-02 | Disposition: A | Discharge status: Home / Self Care | Payer: Medicare Other | Source: Ambulatory Visit | Attending: Internal Medicine | Admitting: Internal Medicine

## 2011-04-02 DIAGNOSIS — G8190 Hemiplegia, unspecified affecting unspecified side: Secondary | ICD-10-CM | POA: Insufficient documentation

## 2011-04-02 DIAGNOSIS — B999 Unspecified infectious disease: Secondary | ICD-10-CM

## 2011-04-02 NOTE — Procedures (Signed)
Right arm PICC.  Tip in SVC.  Length = 35 cm.

## 2011-04-11 ENCOUNTER — Ambulatory Visit (HOSPITAL_COMMUNITY): Admission: RE | Admit: 2011-04-11 | Payer: Medicare Other | Source: Ambulatory Visit

## 2011-04-11 ENCOUNTER — Emergency Department (HOSPITAL_COMMUNITY)
Admission: EM | Admit: 2011-04-11 | Discharge: 2011-04-11 | Disposition: A | Discharge status: Home / Self Care | Payer: Medicare Other | Attending: Emergency Medicine | Admitting: Emergency Medicine

## 2011-04-11 ENCOUNTER — Emergency Department (HOSPITAL_COMMUNITY): Payer: Medicare Other

## 2011-04-11 ENCOUNTER — Ambulatory Visit (HOSPITAL_COMMUNITY)
Admission: RE | Admit: 2011-04-11 | Discharge: 2011-04-11 | Disposition: A | Discharge status: Home / Self Care | Payer: Medicare Other | Source: Ambulatory Visit | Attending: Emergency Medicine | Admitting: Emergency Medicine

## 2011-04-11 ENCOUNTER — Encounter: Payer: Self-pay | Admitting: Emergency Medicine

## 2011-04-11 DIAGNOSIS — I10 Essential (primary) hypertension: Secondary | ICD-10-CM | POA: Insufficient documentation

## 2011-04-11 DIAGNOSIS — M7989 Other specified soft tissue disorders: Secondary | ICD-10-CM | POA: Insufficient documentation

## 2011-04-11 DIAGNOSIS — E119 Type 2 diabetes mellitus without complications: Secondary | ICD-10-CM | POA: Insufficient documentation

## 2011-04-11 DIAGNOSIS — Z794 Long term (current) use of insulin: Secondary | ICD-10-CM | POA: Insufficient documentation

## 2011-04-11 DIAGNOSIS — R609 Edema, unspecified: Secondary | ICD-10-CM

## 2011-04-11 DIAGNOSIS — Z8673 Personal history of transient ischemic attack (TIA), and cerebral infarction without residual deficits: Secondary | ICD-10-CM | POA: Insufficient documentation

## 2011-04-11 DIAGNOSIS — I82629 Acute embolism and thrombosis of deep veins of unspecified upper extremity: Secondary | ICD-10-CM | POA: Insufficient documentation

## 2011-04-11 HISTORY — DX: Unspecified convulsions: R56.9

## 2011-04-11 HISTORY — DX: Cerebral infarction, unspecified: I63.9

## 2011-04-11 HISTORY — DX: Acute embolism and thrombosis of unspecified deep veins of unspecified lower extremity: I82.409

## 2011-04-11 HISTORY — DX: Anemia, unspecified: D64.9

## 2011-04-11 HISTORY — DX: Essential (primary) hypertension: I10

## 2011-04-11 HISTORY — DX: Hyperlipidemia, unspecified: E78.5

## 2011-04-11 MED ORDER — ENOXAPARIN SODIUM 150 MG/ML ~~LOC~~ SOLN: 100.0000 mg | Freq: Two times a day (BID) | SUBCUTANEOUS | Status: AC

## 2011-04-11 MED ORDER — WARFARIN SODIUM 5 MG PO TABS: 5.0000 mg | ORAL_TABLET | Freq: Every day | ORAL | Status: DC

## 2011-04-11 NOTE — ED Notes (Signed)
Pt to ED with Right arm swelling since yesterday. Pt was sent by PCP to have u/s done of right arm due to swelling. 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 U/S tech stated that pt was told to come to ER by Dr Marc Morgans from [facility] to be treated for DVT. Pt with PICC line to right arm due to infected pressure ulcer on his right foot. Pt in gown. Wife at bedside. Pt awaits eval

## 2011-04-11 NOTE — ED Notes (Signed)
OZH:[id number]<HQ> Expected date:04/11/11<BR> Expected time: 9:02 AM<BR> Means of arrival:Car<BR> Comments:<BR> positive DVT-arm

## 2011-04-11 NOTE — ED Provider Notes (Signed)
History     CSN: [id number] Arrival date & time: 04/11/2011  9:27 AM   First MD Initiated Contact with Patient 04/11/11 1035      Chief Complaint  Patient presents with  . Arm Swelling    (Consider location/radiation/quality/duration/timing/severity/associated sxs/prior treatment) HPI Comments: Patient with history of CVA, DM.  In nursing home receiving iv antibiotics for foot ulcer.  Started with swelling of the right upper extremity that a picc line was placed in.  Had [place] this am which showe rue dvt.    Patient is a 50 y.o. male presenting with extremity pain.  Extremity Pain This is a new problem. The current episode started 2 days ago. The problem has been gradually worsening. Pertinent negatives include no chest pain and no shortness of breath. The symptoms are aggravated by nothing. The symptoms are relieved by nothing.    Past Medical History  Diagnosis Date  . Diabetes mellitus   . Seizures   . Hypertension   . Hyperlipemia   . Anemia   . DVT (deep venous thrombosis)   . Stroke     No past surgical history on file.  No family history on file.  History  Substance Use Topics  . Smoking status: Not on file  . Smokeless tobacco: Not on file  . Alcohol Use: No      Review of Systems  Respiratory: Negative for shortness of breath.   Cardiovascular: Negative for chest pain.  All other systems reviewed and are negative.    Allergies  Review of patient's allergies indicates no known allergies.  Home Medications   Current Outpatient Rx  Name Route Sig Dispense Refill  . ACETAMINOPHEN 500 MG PO TABS Oral Take 1,000 mg by mouth 3 (three) times daily.      . ASPIRIN 81 MG PO CHEW Oral Chew 81 mg by mouth daily.      Marland Kitchen BACLOFEN 10 MG PO TABS Oral Take 15 mg by mouth 3 (three) times daily.      Marland Kitchen CEFTRIAXONE SODIUM 1 G IJ SOLR Intramuscular Inject 1 g into the muscle once.      Marland Kitchen VITAMIN D 1000 UNITS PO TABS Oral Take 2,000 Units by mouth daily.      Marland Kitchen  CEFTRIAXONE 1 GM IVPB Intravenous Inject 1 g into the vein daily.      Marland Kitchen DIVALPROEX SODIUM 500 MG PO TBEC Oral Take 500 mg by mouth every 6 (six) hours.      . DOCUSATE SODIUM 100 MG PO CAPS Oral Take 100 mg by mouth every morning.      Marland Kitchen ESCITALOPRAM OXALATE 20 MG PO TABS Oral Take 40 mg by mouth daily.      Marland Kitchen FAMOTIDINE 20 MG PO TABS Oral Take 20 mg by mouth daily.     . MULTIGEN PO Oral Take 1 capsule by mouth daily.      . INSULIN ASPART 100 UNIT/ML [place] SOLN Subcutaneous Inject 0-12 Units into the skin 3 (three) times daily before meals. Sliding scale insulin 0-150=0 units, 151-200=2 units, 201-250=4 units, 251-300=6 units, 301-350=8 units, 351-400=10 units, 401-450=12 units, greater than 450 CALL MD.     . INSULIN GLARGINE 100 UNIT/ML [place] SOLN Subcutaneous Inject 40 Units into the skin at bedtime.     Marland Kitchen LISINOPRIL 5 MG PO TABS Oral Take 5 mg by mouth daily.      Marland Kitchen METFORMIN HCL 1000 MG PO TABS Oral Take 1,000 mg by mouth 2 (two) times daily.      Marland Kitchen  THERA M PLUS PO TABS Oral Take 1 tablet by mouth daily.      Marland Kitchen NIACIN (ANTIHYPERLIPIDEMIC) 500 MG PO TBCR Oral Take 1,000 mg by mouth at bedtime.      . OMEGA-3-ACID ETHYL ESTERS 1 G PO CAPS Oral Take 2 g by mouth 2 (two) times daily.      . OXYBUTYNIN CHLORIDE ER 5 MG PO TB24 Oral Take 5 mg by mouth daily.      Marland Kitchen POLYETHYL GLYCOL-PROPYL GLYCOL 0.4-0.3 % OP SOLN Right Eye Place 1 drop into the right eye 4 (four) times daily as needed. For dry eyes     . POLYETHYLENE GLYCOL 3350 PO POWD Oral Take 17 g by mouth at bedtime.      Marland Kitchen VANCOMYCIN HCL IV Intravenous Inject 1,750 mg into the vein every 12 (twelve) hours.      Marland Kitchen VITAMIN C 500 MG PO TABS Oral Take 500 mg by mouth 2 (two) times daily.        BP 134/70  Pulse 76  Temp(Src) 98.1 F (36.7 C) (Oral)  Resp 18  SpO2 95%  Physical Exam  Constitutional: He is oriented to person, place, and time. He appears well-developed and well-nourished.  HENT:  Head: Normocephalic and atraumatic.  Neck:  Normal range of motion. Neck supple.  Cardiovascular: Normal rate and regular rhythm.   Pulmonary/Chest: Effort normal and breath sounds normal.  Abdominal: Soft. Bowel sounds are normal.  Musculoskeletal:       The rue has a contracture of the hand.  There is swelling and edema of the forearm.  Pulses are palpable.    Neurological: He is alert and oriented to person, place, and time.  Skin: Skin is warm and dry.    ED Course  Procedures (including critical care time)  Labs Reviewed - No data to display No results found.   No diagnosis found.    MDM  IV team called and removed the PICC line.  Patient needs to be started on Coumadin and needs peripheral access for antibiotics.  The iv will be placed and the lovenox injections can be done at the nursing home.        Geoffery Lyons, MD 04/11/11 1215

## 2012-09-28 ENCOUNTER — Non-Acute Institutional Stay (SKILLED_NURSING_FACILITY): Payer: Medicare Other | Admitting: Internal Medicine

## 2012-09-28 DIAGNOSIS — L089 Local infection of the skin and subcutaneous tissue, unspecified: Secondary | ICD-10-CM

## 2012-09-28 DIAGNOSIS — E11628 Type 2 diabetes mellitus with other skin complications: Secondary | ICD-10-CM

## 2012-09-28 DIAGNOSIS — E1169 Type 2 diabetes mellitus with other specified complication: Secondary | ICD-10-CM

## 2012-09-28 DIAGNOSIS — A4902 Methicillin resistant Staphylococcus aureus infection, unspecified site: Secondary | ICD-10-CM

## 2012-10-04 ENCOUNTER — Other Ambulatory Visit (HOSPITAL_BASED_OUTPATIENT_CLINIC_OR_DEPARTMENT_OTHER): Payer: Self-pay | Admitting: Internal Medicine

## 2012-10-04 DIAGNOSIS — B999 Unspecified infectious disease: Secondary | ICD-10-CM

## 2012-10-06 ENCOUNTER — Ambulatory Visit (HOSPITAL_COMMUNITY)
Admission: RE | Admit: 2012-10-06 | Discharge: 2012-10-06 | Disposition: A | Discharge status: Home / Self Care | Payer: Medicare Other | Source: Ambulatory Visit | Attending: Internal Medicine | Admitting: Internal Medicine

## 2012-10-06 ENCOUNTER — Other Ambulatory Visit (HOSPITAL_BASED_OUTPATIENT_CLINIC_OR_DEPARTMENT_OTHER): Payer: Self-pay | Admitting: Internal Medicine

## 2012-10-06 DIAGNOSIS — L089 Local infection of the skin and subcutaneous tissue, unspecified: Secondary | ICD-10-CM | POA: Insufficient documentation

## 2012-10-06 DIAGNOSIS — B999 Unspecified infectious disease: Secondary | ICD-10-CM

## 2012-10-06 NOTE — Procedures (Signed)
US/fluoroscopic guided placement of left SL basilic vein PICC performed. Length 47 cm. Tip SVC/RA junction. No immediate complications.

## 2012-10-07 ENCOUNTER — Telehealth (HOSPITAL_COMMUNITY): Payer: Self-pay | Admitting: *Deleted

## 2012-10-10 ENCOUNTER — Non-Acute Institutional Stay (SKILLED_NURSING_FACILITY): Payer: Medicare Other | Admitting: Internal Medicine

## 2012-10-10 DIAGNOSIS — E1149 Type 2 diabetes mellitus with other diabetic neurological complication: Secondary | ICD-10-CM

## 2012-10-10 DIAGNOSIS — E13621 Other specified diabetes mellitus with foot ulcer: Secondary | ICD-10-CM

## 2012-10-10 DIAGNOSIS — L97509 Non-pressure chronic ulcer of other part of unspecified foot with unspecified severity: Secondary | ICD-10-CM

## 2012-10-10 DIAGNOSIS — E1369 Other specified diabetes mellitus with other specified complication: Secondary | ICD-10-CM

## 2012-10-10 DIAGNOSIS — A4902 Methicillin resistant Staphylococcus aureus infection, unspecified site: Secondary | ICD-10-CM

## 2012-10-10 NOTE — Progress Notes (Signed)
Patient ID: [name], male   DOB: 08-May-1960, 52 y.o.   MRN: [id number] Facility [facility]. Chief complaint review of wound over the right third PIP.  History; [name] is a type II diabetic on insulin. He has diabetic neuropathy, but no evidence of significant PAD. I saw him on my last visit to the facility. He had a necrotic draining wound over his right third PIP joint in his right foot. This underwent a surgical debridement at the time with deep cultures ultimately showed MRSA. That was clindamycin resistant. When the culture results became available, I elected to put him on vancomycin for 2 weeks. This was started on 10/04/2012. We have been using silver alginate.  Review of systems; Gen. is not systemically unwell. Respiratory no shortness of breath. Cardiac no chest pain. Endocrine generally for control of his diabetes with fasting blood sugars between 150 and 281 and a.c. dinner generally in the 200-250 range. However, his recent hemoglobin A1c was 5.5  Physical exam; Right foot; peripheral pulses are easily felt with risk dorsalis pedis and good capillary refill time. Miraculously his right third toe looks considerably better, with less erythema, swelling. There is no drainage. His wound appears to be superficial. Cardiac heart sounds are normal. No murmurs.  Impression/plan #1 Wagener's grade 3 diabetic foot ulcer. I was very concerned about this last time I saw this however, everything looks considerably better today. I will complete a two-week course of vancomycin with transitioning him to Septra for a further 2 weeks. Need to be vigilant about his potassium as Septra has increased this in the past.  #2 type 2 diabetes with diabetic neuropathy. Although his CBGs are generally somewhat elevated. His hemoglobin A1c is really under excellent control. I will change him to NovoLog 4 units a.c. breakfast, 6 units a.c. lunch, and dinner. No change the Lantus insulin, which is  currently at 55 units.

## 2012-10-17 NOTE — Progress Notes (Signed)
Patient ID: [name], male   DOB: Mar 06, 1961, 52 y.o.   MRN: [id number]           PROGRESS NOTE  DATE:  09/28/2012  FACILITY: [facility]   LEVEL OF CARE:   SNF   Acute Visit   CHIEF COMPLAINT:  Infected wound over the PIP of his right third toe.    HISTORY OF PRESENT ILLNESS:  Mr. Fussell is a type 2 diabetic, on insulin. Through the latter part of 2012 and a good portion of early to mid 2013, we dealt with a recurrent wound on the right fifth metatarsal head with underlying osteomyelitis.  He required meticulous wound care in the facility, prolonged courses of both oral and IV antibiotics and, miraculously, this area closed over.  I was skeptical this was going to happen.  However, this has remained closed for almost a year now.    This new wound is over the PIP of his right third toe.  According to the patient, this has been there for two weeks, although the facility has only known about this over the last 48 hours.  He has not been systemically unwell.    PHYSICAL EXAMINATION:   CIRCULATION:   ARTERIAL:  Right leg:  His peripheral pulses are palpable.   EDEMA/VARICOSITIES:  There is edema.   SKIN:  INSPECTION:  The area of concern is localized to the PIP of the right third toe, as noted.   The area was cleaned carefully and, with a #10 scalpel, an area of necrotic eschar and necrotic tissue underneath was removed.  There is exposed bone here, surrounding erythema which extends to the dorsal proximal base of the toe.  He is insensate.  Therefore, there is not any pain.    ASSESSMENT/PLAN:  Infected Wagner 3 diabetic foot ulcer as noted above.  Debridement done, as noted.  I will await C&S.  I have started him on Septra and clindamycin.   My records for what he exactly cultured from his right foot previously are not in the records.  However, this does look like a Staph infection.  It is possible that he will need intravenous antibiotics.  For now, I am going to try him on oral  antibiotics.  We will check laboratory work on him, as well.  As usual, referral to outside surgeons, podiatrists, etc., is certainly not out of the question.    CPT CODE: 11914

## 2012-11-01 ENCOUNTER — Non-Acute Institutional Stay (SKILLED_NURSING_FACILITY): Payer: Medicare Other | Admitting: Internal Medicine

## 2012-11-01 DIAGNOSIS — I639 Cerebral infarction, unspecified: Secondary | ICD-10-CM

## 2012-11-01 DIAGNOSIS — K5900 Constipation, unspecified: Secondary | ICD-10-CM

## 2012-11-01 DIAGNOSIS — R569 Unspecified convulsions: Secondary | ICD-10-CM | POA: Insufficient documentation

## 2012-11-01 DIAGNOSIS — E785 Hyperlipidemia, unspecified: Secondary | ICD-10-CM

## 2012-11-01 DIAGNOSIS — E1165 Type 2 diabetes mellitus with hyperglycemia: Secondary | ICD-10-CM | POA: Insufficient documentation

## 2012-11-01 DIAGNOSIS — E119 Type 2 diabetes mellitus without complications: Secondary | ICD-10-CM | POA: Insufficient documentation

## 2012-11-01 DIAGNOSIS — I82409 Acute embolism and thrombosis of unspecified deep veins of unspecified lower extremity: Secondary | ICD-10-CM | POA: Insufficient documentation

## 2012-11-01 DIAGNOSIS — I6930 Unspecified sequelae of cerebral infarction: Secondary | ICD-10-CM | POA: Insufficient documentation

## 2012-11-01 DIAGNOSIS — I6350 Cerebral infarction due to unspecified occlusion or stenosis of unspecified cerebral artery: Secondary | ICD-10-CM

## 2012-11-01 DIAGNOSIS — E875 Hyperkalemia: Secondary | ICD-10-CM

## 2012-11-01 DIAGNOSIS — I10 Essential (primary) hypertension: Secondary | ICD-10-CM | POA: Insufficient documentation

## 2012-11-01 DIAGNOSIS — F329 Major depressive disorder, single episode, unspecified: Secondary | ICD-10-CM

## 2012-11-01 DIAGNOSIS — I82401 Acute embolism and thrombosis of unspecified deep veins of right lower extremity: Secondary | ICD-10-CM

## 2012-11-01 NOTE — Progress Notes (Signed)
Patient ID: [name], male   DOB: Jul 25, 1960, 52 y.o.   MRN: [id number]  This is a routine visit.  Level of care skilled.  Facility [facility].  Chief complaint-medical management of diabetes CVA late effect seizure disorder hyperlipidemia hypertension depression history of right arm DVT.  History of present illness.  Patient is a middle-aged male with the above diagnosis he has been relatively stable.  Most recent issue has a wound over the the PIP of his right third toe-this has responded very well to vancomycin and subsequently Septra and appears largely resolved.  He does have a history of diabetes Dr. Leanord Hawking  recently adjusted his NovoLog insulin with meals and this appears to have helped--  He currently receives Lantus 55 units each bedtime as well as NovoLog 6 units before lunch and dinner and 4 units before breakfast.--He is also on Glucophage  Morning blood sugars appear to run from the mid 100s to mid 200s this also appears to be the case at noon.  Later in the day actually sugars are a bit   lower largely in the 100s with occasional spikes a bit above 200 but this does not appear to be real frequent  Obesity there is some dietary noncompliance according to nursing staff hemoglobin A1c back in April was 7.9.  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  Blood pressure appears to be stable he is on lisinopril  Recent blood pressure is 119/76-130/74.  He also has a history of seizure disorder he is on Depakote there've been no recent seizures to my knowledge.  Today he has no acute complaints.  However I do note on recent metabolic panel potassium was elevated at 6.0 we will need to recheck this-    Family medical social history as been reviewed her discharge note on 04/17/2008 in previous progress notes.  Medications have been reviewed per MAR.  Review of systems.  In general denies any  fever or chills his weight has been stable.  Skin denies issues does have a history of wound a right third toe that appears to be largely resolving.  Head ears eyes nose mouth and throat-is not complaining of any visual changes or sore throat.  Respiratory-no complaints of cough or shortness of breath.  Cardiac-does not complaining of chest pain.  GI-apparently has a good appetite denies any abdominal pain nausea or vomiting diarrhea ---according to nursing staff he does have frequent constipation this has been somewhat of a challenge because he did not like to take liquis laxatives  GU no complaints of dysuria.  Muscle skeletal-does have a history of right-sided weakness but does not complaining of any joint pain today.  Neurologic-again neurologic effects on the right but no complaints of headache or dizziness.  Psych he is alert and oriented x3 some history of depression occasionally gets a bit agitated.  Physical exam.  He is afebrile pulse is 78 respirations 18 blood pressure 119/70.  In general this is a well-nourished middle-age male in no distress lying comfortably in bed.  His skin is warm and dry.  Eyes pupils appear equal round react to light sclerae and conjunctivae are clear.  Mouth oropharynx clear mucous membranes are moist.  Heart is regular rate and rhythm without murmur gallop or rub.  Chest is clear to auscultation without rhonchi rales or wheezes.  Abdomen is somewhat obese soft nontender with slightly hypoactive bowel sounds.  Muscle skeletal-continue  right-sided weakness he does have a boot applied to his right foot--left-sided strength appears to be intact -- I did not note any deformities  Illogic-as stated above does have a slight right mouth droop which is not new.  Psych as he is alert and oriented x3 pleasant and appropriate.  Labs.  10/19/2012.  Sodium 134 potassium 6 BUN 14 creatinine 1.00.    10/11/2012.  WBC 4.2 hemoglobin 10.3  platelets 181. Marland Kitchen 08-30-2012  Hemoglobin A1c 7.9.  07/19/2012.  Cholesterol 143 triglycerides 245 HDL 36 LDL 58.  Liver function tests within normal limits    Assessment and plan.  #1-hyperkalemia-we'll obtain a stat BMP clinically he appears stable this might be a lab abnormality-however he is on an ACE inhibitor and had been on Septra although it appears  Septra had not started yet when this lab was done--Will await results. Will hold lisinopril for now.  #2 diabetes type 2-this appears to be under fairly decent control NovoLog was changed recently-will need an updated hemoglobin A1c in about a month.  #3-hypertension-at this point appear stable --hold lisinopril for now continue to monitor blood pressures.  #4-CVA late effect S. this appears to be at baseline he is on aspirin.  #5 past history DVT right arm this is fairly distant past again he is on aspirin for anticoagulation.  #6-hyperlipidemia--- recent lipid panel appeared be fairly satisfactory triglycerides still elevated secondary I. suspect diabetes  Is at least partially the cause  will continue to monitor--- liver function tests were unremarkable He continues on niacin as well as  Lovaza--. #7 History of depression-this appears to be stable on Lexapro.  8-history seizure disorder he is on Depakote Will update Depakote level.  #9-history of muscle spasms   he is on baclofen as well as Botox and this appears to help  #10-constipation-apparently this continues to be an issue at times he does not like to take liquids-he is getting Colace once a day-he refuses  Enulose in the past according to nursing staff-will-start senna S. one tab twice a day and monitor-hold for diarrhea.  #11-history of urinary issues -- he is on Ditropan--apparently this has not been an issue recently--per chart review had had urinary retention issues in the past.  Number 12-- history of dysphasia-at one point he did have a PEG tube this has long  since been removed he does continue on a PPI and this appears to help.  Again most acute issue is the high potassium on a lab-Will await  result of the BMP to make sure this is stable and will hold the ACE inhibitor for now--and monitor blood pressures every shift  [id number]-of note greater than 45 minutes spent assessing patient-discussing his status with nursing staff-and formulating a plan of care for numerous diagnoses--50% of time spent formulating and coordinating plan of care

## 2012-11-02 ENCOUNTER — Non-Acute Institutional Stay (SKILLED_NURSING_FACILITY): Payer: Medicare Other | Admitting: Internal Medicine

## 2012-11-02 DIAGNOSIS — E875 Hyperkalemia: Secondary | ICD-10-CM

## 2012-11-02 NOTE — Progress Notes (Signed)
Patient ID: KARMA HINEY, male   DOB: 04-20-1961, 52 y.o.   MRN: [id number]  This is an acute visit.  Level of care skilled.  Facility [facility] complaint.  Acute visit followup8 This is a middle-aged male whoI saw yesterday secondary to a lab back in June 18 that showed a potassium of 6.0.  He is not on potassium supplementation but was on an ACE inhibitor.  A stat followup lab was ordered which has come back looking quite benign with a potassium of 4.3.  My suspicion is the initial lab was possibly homolysis or laboratory abnormality.  He continues to be stable-I did write orders to hold this lisinopril until further notice-his blood pressures had been fairly well controlled previously 118/70-130/74.  Marland Kitchen  Physical exam.  Vital signs remained stable.  This is a middle-aged male pleasant sitting comfortably in his bed working on his computer.  Heart is regular rate and rhythm without murmur gallop or rub.  Chest chest is clear to auscultation.  Labs.  11/01/2012.  Sodium 138 potassium 4.3 BUN 14 creatinine 0.8.  10/19/2012.  Sodium 134 potassium 6 BUN 14 creatinine 1.00.  Assessment and plan.  Hyperkalemia-  At this point I suspect is high potassium couple weeks ago was more of a lab variation-he has tolerated his ACE inhibitor well in the past.--We will restart this  Will keep an eye on this with a metabolic panel in one week  [id number]

## 2013-01-11 ENCOUNTER — Non-Acute Institutional Stay (SKILLED_NURSING_FACILITY): Payer: Medicare Other | Admitting: Internal Medicine

## 2013-01-11 DIAGNOSIS — I82409 Acute embolism and thrombosis of unspecified deep veins of unspecified lower extremity: Secondary | ICD-10-CM

## 2013-01-11 DIAGNOSIS — F3289 Other specified depressive episodes: Secondary | ICD-10-CM

## 2013-01-11 DIAGNOSIS — F329 Major depressive disorder, single episode, unspecified: Secondary | ICD-10-CM

## 2013-01-11 DIAGNOSIS — E785 Hyperlipidemia, unspecified: Secondary | ICD-10-CM

## 2013-01-11 DIAGNOSIS — I639 Cerebral infarction, unspecified: Secondary | ICD-10-CM

## 2013-01-11 DIAGNOSIS — E119 Type 2 diabetes mellitus without complications: Secondary | ICD-10-CM

## 2013-01-11 DIAGNOSIS — I10 Essential (primary) hypertension: Secondary | ICD-10-CM

## 2013-01-11 DIAGNOSIS — R569 Unspecified convulsions: Secondary | ICD-10-CM

## 2013-01-11 DIAGNOSIS — I6350 Cerebral infarction due to unspecified occlusion or stenosis of unspecified cerebral artery: Secondary | ICD-10-CM

## 2013-01-11 NOTE — Progress Notes (Signed)
Patient ID: [name], male   DOB: 05-Feb-1961, 52 y.o.   MRN: [id number] This is a routine visit.  Level of care skilled.  Facility [facility].   Chief complaint-medical management of diabetes CVA late effect seizure disorder hyperlipidemia hypertension depression history of right arm DVT.   History of present illness.  Patient is a middle-aged male with the above diagnosis he has been relatively stable. He is status post CVA--   .  He does have a history of diabetes Dr. Leanord Hawking recently adjusted his NovoLog insulin with meals and this appears to have helped--  He currently receives Lantus 55 units each bedtime as well as NovoLog 6 units before lunch and dinner and 4 units before breakfast.--He is also on Glucophage  Morning blood sugars appear to run from the low- mid 100s in the morning.  Mid 100s to low 200s at noon.  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  Blood pressure appears to be stable he is on lisinopril  Recent blood pressure   13 8/76-124/80.  He also has a history of seizure disorder he is on Depakote there've been no recent seizures to my knowledge.  Today he has no acute complaints.  -  Family medical social history as been reviewed per discharge note on 04/17/2008 in previous progress notes .  Medications have been reviewed per MAR.   Review of systems.  In general denies any fever or chills his weight has been stable.  Skin denies issues does have a history of wound a right third toe that appears to be largely resolving.  Head ears eyes nose mouth and throat-is not complaining of any visual changes or sore throat.  Respiratory-no complaints of cough or shortness of breath.  Cardiac-does not complaining of chest pain.  GI-apparently has a good appetite denies any abdominal pain nausea or  vomiting diarrhea -no complaints of constipation today   GU no complaints of dysuria. -has indwelling catheter Muscle skeletal-does have a history of right-sided weakness but does not complaining of any joint pain today.  Neurologic-again neurologic effects on the right but no complaints of headache or dizziness.  Psych he is alert and oriented x3 some history of depression occasionally gets a bit agitated--but apparently this is happening being less frequently according to nursing staff.   Physical exam.  97.6 pulse 70 respirations 18 blood pressure 138/76 weight is stable at 218.  In general this is a well-nourished middle-age male in no distress lying comfortably in bed.  His skin is warm and dry.  Eyes pupils appear equal round react to light sclerae and conjunctivae are clear.  Mouth oropharynx clear mucous membranes are moist.  Heart is regular rate and rhythm without murmur gallop or rub.  Chest is clear to auscultation without rhonchi rales or wheezes.  Abdomen is somewhat obese soft nontender with slightly hypoactive bowel sounds  GU- he has an indwelling Foley catheter draining amber colored urine   Muscle skeletal-continue right-sided weakness he does have a boot applied to his right foot--left-sided strength appears to be intact -- I did not note any deformities  Neuro-as stated above does have a slight right mouth droop which is not new.  Psych as he is alert and oriented x3 pleasant and appropriate .  Labs 11/25/2012.  WBC 5.5 hemoglobin 12.6 platelets 166.  Sodium 140 potassium 4.1 BUN 15 creatinine 0.73.  Hg A1c-6.8.  Depakote level-61.  10/19/2012.  Sodium 134 potassium 6 BUN 14 creatinine 1.00.  10/11/2012.  WBC 4.2 hemoglobin 10.3 platelets 181.  Marland Kitchen  08-30-2012  Hemoglobin A1c 7.9.  07/19/2012.  Cholesterol 143 triglycerides 245 HDL 36 LDL 58.  Liver function tests within normal limits   Assessment and plan.  .  #1 diabetes type 2-this appears to be  under fairly decent control NovoLog was changed recently-will need an updated hemoglobin A1c in about a month.  #2-hypertension-at this point appear stable -k we held his lisinopril in early July secondary to a lab in June that showed a potassium of 6.0-however when this lab was discovered we ordered a stat metabolic panel and potassium was well within normal range at 4.3-one would suspect the initial lab in June had some homolysis or lab abnormality Will cautiously restart lisinopril for its renal protective effects 2.5 mg a day and monitor--check BMP next lab day  and also in 10 days  #3-CVA late effect S. this appears to be at baseline he is on aspirin.  #4 past history DVT right arm this is fairly distant past again he is on aspirin for anticoagulation.  #5-hyperlipidemia--- recent lipid panel appeared be fairly satisfactory triglycerides still elevated secondary I. suspect diabetes Is at least partially the cause will continue to monitor--- liver function tests were unremarkable  He continues on niacin as well as Lovaza--.  #7  History of depression-this appears to be stable on Lexapro.  8-history seizure disorder he is on Depakote  #9-history of muscle spasms he is on baclofen as well as Botox and this appears to help  #10-constipation--this is more well controlled recently he is on Colace and Senna .  #11-history of urinary issues -- he is on Ditropan--apparently this has not been an issue recently--per chart review had had urinary retention issues in the past  [phone]

## 2013-03-22 ENCOUNTER — Non-Acute Institutional Stay (SKILLED_NURSING_FACILITY): Payer: Medicare Other | Admitting: Internal Medicine

## 2013-03-22 DIAGNOSIS — H409 Unspecified glaucoma: Secondary | ICD-10-CM

## 2013-03-22 DIAGNOSIS — E1149 Type 2 diabetes mellitus with other diabetic neurological complication: Secondary | ICD-10-CM

## 2013-03-22 DIAGNOSIS — E11319 Type 2 diabetes mellitus with unspecified diabetic retinopathy without macular edema: Secondary | ICD-10-CM

## 2013-03-22 DIAGNOSIS — E1139 Type 2 diabetes mellitus with other diabetic ophthalmic complication: Secondary | ICD-10-CM

## 2013-03-22 NOTE — Progress Notes (Signed)
Patient ID: [name], male   DOB: 11-Jun-1960, 52 y.o.   MRN: [id number] Facility; [facility] Chief complaint; blurring vision of the left eye, review of medical issues History; patient is a long-standing resident of this facility dating back to a left hemisphere CVA I believe in 2007. He is a type II diabetic on insulin we have had major issues with diabetic foot ulcers in the right foot with underlying osteomyelitis however for the last several months the foot has been without any wound issues. He does not have significant PAD. Recent lab work shows a normal BUN creatinine with an estimated GFR of over 90. Last hemoglobin A1c in July was 6.8. I do not see a recent lipid panel ordered urine for microalbumin  The patient states that a week ago when he developed sudden blurriness of vision in his left eye. This was not associated with pain however his vision has not cleared since. There is no history of a headache and as mentioned no pain in the eye itself   has a past medical history of Diabetes mellitus; Seizures; Hypertension; Hyperlipemia; Anemia; DVT (deep venous thrombosis); and Stroke. Diabetic osteomyelitis of the right foot  Current medication; Pepcid 20 mg daily, enteric-coated aspirin 81 daily, vitamin D 3 2000 units daily, Ditropan XL 5 mg daily, Lexapro 10 mg daily, Prinivil 2.5 daily, Flonase 2 capsules by mouth twice daily, Alphagan 0.2% one drop into each eye twice a day, Depakote ER 500 mg by mouth every 6 hours, Niaspan ER 502 tablets daily, metformin 1000 twice a day, Lantus 55 units at bedtime, NovoLog 4 units before breakfast and 6 units before lunch and dinner  Review of systems HEENT; relatively sudden and persistent visual loss in his left eye Respiratory no shortness of breath Cardiac no chest pain GU has a Foley catheter in place. I am unable to easily obtain the history here I'll try to check with the staff.  Physical examination Respiratory clear entry  bilaterally Cardiac no no murmurs no gallops Abdomen; no liver no spleen no masses Extremities no evidence of wounds in his feet  Impression/plan #1 visual complaints in the left eye. He has a history of glaucoma and diabetic retinopathy followed by [facility] ophthalmology he'll need to get back to see them ASAP #2 type 2 diabetes well controlled per his recent hemoglobin A1c in review of his CBGs. Will need a repeat hemoglobin A1c and fasting lipid panel #3 hyperlipidemia he is on Niaspan which probably is a well supported by the recent literature. He is not on a statin this may need to be reviewed after I check the fasting lipids #4 history of a left basal ganglia CVA and seizures. He will need a valproic acid level checked in July at 78 #5 history of diabetic osteomyelitis in the right foot. This is her current problem through a lot of 2013 recently in the spring of 2014. Fortunately at the moment there does not appear to be any underlying wounds however I am not certain that the underlying osteomyelitis has been totally eradicated however I don't see the point in pursuing additional MRIs unless there is additional wounds.

## 2013-05-16 ENCOUNTER — Encounter (HOSPITAL_COMMUNITY): Payer: Self-pay | Admitting: Pharmacy Technician

## 2013-05-17 ENCOUNTER — Non-Acute Institutional Stay (SKILLED_NURSING_FACILITY): Payer: Medicare Other | Admitting: Internal Medicine

## 2013-05-17 DIAGNOSIS — E119 Type 2 diabetes mellitus without complications: Secondary | ICD-10-CM

## 2013-05-17 DIAGNOSIS — R569 Unspecified convulsions: Secondary | ICD-10-CM

## 2013-05-17 DIAGNOSIS — I6350 Cerebral infarction due to unspecified occlusion or stenosis of unspecified cerebral artery: Secondary | ICD-10-CM

## 2013-05-17 DIAGNOSIS — L739 Follicular disorder, unspecified: Secondary | ICD-10-CM

## 2013-05-17 DIAGNOSIS — I10 Essential (primary) hypertension: Secondary | ICD-10-CM

## 2013-05-17 DIAGNOSIS — I82409 Acute embolism and thrombosis of unspecified deep veins of unspecified lower extremity: Secondary | ICD-10-CM

## 2013-05-17 DIAGNOSIS — F3289 Other specified depressive episodes: Secondary | ICD-10-CM

## 2013-05-17 DIAGNOSIS — I639 Cerebral infarction, unspecified: Secondary | ICD-10-CM

## 2013-05-17 DIAGNOSIS — L738 Other specified follicular disorders: Secondary | ICD-10-CM

## 2013-05-17 DIAGNOSIS — E785 Hyperlipidemia, unspecified: Secondary | ICD-10-CM

## 2013-05-17 DIAGNOSIS — F329 Major depressive disorder, single episode, unspecified: Secondary | ICD-10-CM

## 2013-05-22 NOTE — Progress Notes (Signed)
Patient ID: [name], male   DOB: 15-Nov-1960, 53 y.o.   MRN: [id number]   [facility]    Chief complaint; , review of medical issues   History; patient is a long-standing resident of this facility dating back to a left hemisphere CVA  in 2007. He is a type II diabetic on insulin we have had major issues with diabetic foot ulcers in the right foot with underlying osteomyelitis however for the last several months the foot has been without any wound issues. He does not have significant PAD. Recent lab work shows a normal BUN creatinine . Last hemoglobin A1c in July wa BUN of 17 creatinine 0.61-hemoglobin A1c of 6.5  He also had a cholesterol panel which showed a total cholesterol 135 triglycerides elevated at 339 HDL 34 and LDL 33 The patient apparently stated  to Dr. Leanord Hawkingobson back in November that he had developed sudden blurriness of vision in his left eye. This was not associated with pain however -- appears he does have cataract surgery pending-he does not complaining of acute visual changes today. There is no history of a headache and as mentioned no pain in the eye itself  has a past medical history of Diabetes mellitus; Seizures; Hypertension; Hyperlipemia; Anemia; DVT (deep venous thrombosis); and Stroke. Diabetic osteomyelitis of the right foot   Current medication; Pepcid 20 mg daily, enteric-coated aspirin 81 daily, vitamin D 3 2000 units daily, Ditropan XL 5 mg daily, Lexapro 10 mg daily, Prinivil 2.5 daily, Flonase 2 capsules by mouth twice daily, Alphagan 0.2% one drop into each eye twice a day, Depakote ER 500 mg by mouth every 6 hours, Niaspan ER 50--2 tablets daily, metformin 1000 twice a day, Lantus 55 units at bedtime, NovoLog 4 units before breakfast and 6 units before lunch and dinner Is also takin  low-dose lisinopril-and also Ditropan as well as Colace for history of constipation--and Lexapro for history of depression  Review of systems Gen. denies any fever  chills his weight has been stable.  Skin does not complaining any rash however staff has noted what appears to be possibly folliculitis starting again in his chin area  HEENT;  --visual changes as noted above Respiratory no shortness of breath  Or cough  Cardiac no chest pain  GU has a Foley catheter in place.  Holo--c history of CVA Psych-history of depression--does not complain of being overtly depressed today .  Physical examination Temperature 98.1 pulse 68 respirations 20 blood pressure 134/78-122/84-weight is stable at 218  In general pleasant middle-aged male in no distress lying comfortably in bed.  His skin is warm and dry right side of his neck chin area there appears to be possibly a small raised red area near the hair follicle-he does have a history of folliculitis   Respiratory clear entry bilaterally  Cardiac no no murmurs no gallops  Abdomen; no liver no spleen no masses sounds are positive.  GU-has a Foley catheter draining amber colored urine   Extremities no evidence of wounds in his feet --continues with right-sided weakness status post CVA  Labs.  03/29/2013.  Sodium 139 potassium 4.2 BUN 17 creatinine 0.61.  Cholesterol 35 triglycerides 339 HDL 34 LDL 33.  Hemoglobin A1c 6.5.  Valproic acid level LIII.  01/21/2013.  WBC 4.1 hemoglobin 12.0 platelets 162.  07/19/2012.  Liver function tests within normal limits  Impression/plan    #1 type 2 diabetes --somewhat variable sugars but do not see consistent highs review of his CBG--occurs  somewhat variable in the morning 89-332 more in the mid 100s high 100s.  Later in the day  100s to low 200s.  At 4 PM mid 100s to 200.  And at at bedtime mid to high 100s-recent hemoglobin A1c was relatively satisfactory continue to monitor . 2 hyperlipidemia he is on Niaspan which probably is a well supported by the recent literature--suspect high triglycerides reflect diabetes--sugars appear to be trending a bit  better-- continue to monitor blood sugars closely  #3 history of a left basal ganglia CVA and seizures. --Continues on aspirin will need updated valproic acid level  #4 history of diabetic osteomyelitis in the right foot. This is her current problem through a lot of 2013 recently in the spring of 2014. Fortunately at the moment there does not appear to be any underlying wounds  #5-. seizure disorder-continues on Depakote again will update valproic acid level.  #6-folliculitis-he has responded well to doxycycline in the past will start this 100 mg twice a day for 7 days and probiotic twice a day for 7 days continue to monitor.  #7-hypertension-this appears to be stable on current medications will update metabolic panel.  #8-depression this appears to be relatively stable on Lexapro  #9-visual changes-it appears he has cataract surgery pending this is followed by ophthalmology-- does not complain of acute changes today  Also would like update lab work including liver function tests basic metabolic panel and CBC  \ [id number]

## 2013-05-24 NOTE — H&P (Signed)
HISTORY AND PHYSICAL  RAMY GRETH is a 53 y.o. male patient with CC: Painful teeth  No diagnosis found.  Past Medical History  Diagnosis Date  . Diabetes mellitus   . Seizures   . Hypertension   . Hyperlipemia   . Anemia   . DVT (deep venous thrombosis)   . Stroke     No current facility-administered medications for this encounter.   Current Outpatient Prescriptions  Medication Sig Dispense Refill  . acetaminophen (TYLENOL) 500 MG tablet Take 1,000 mg by mouth 3 (three) times daily.        Marland Kitchen aspirin 81 MG chewable tablet Chew 81 mg by mouth daily.        . baclofen (LIORESAL) 10 MG tablet Take 15 mg by mouth 3 (three) times daily.        . bisacodyl (DULCOLAX) 5 MG EC tablet Take 10 mg by mouth daily as needed for moderate constipation.      . brimonidine (ALPHAGAN) 0.2 % ophthalmic solution Place 1 drop into both eyes 2 (two) times daily.      . cholecalciferol (VITAMIN D) 1000 UNITS tablet Take 2,000 Units by mouth daily.        . divalproex (DEPAKOTE) 500 MG DR tablet Take 500 mg by mouth every 6 (six) hours.        . docusate sodium (COLACE) 100 MG capsule Take 100 mg by mouth every morning.        . escitalopram (LEXAPRO) 10 MG tablet Take 10 mg by mouth daily.      . famotidine (PEPCID) 20 MG tablet Take 20 mg by mouth daily.       Marland Kitchen Fe Bisg-C-Thre-B12-Des Gastric (CHROMAGEN PO) Take 1 capsule by mouth daily.      . insulin aspart (NOVOLOG) 100 UNIT/ML injection Inject 4-6 Units into the skin 3 (three) times daily before meals. 4 units before breakfast and 6 units before lunch and dinner.      . insulin glargine (LANTUS) 100 UNIT/ML injection Inject 55 Units into the skin at bedtime.       Marland Kitchen lisinopril (PRINIVIL,ZESTRIL) 5 MG tablet Take 2.5 mg by mouth daily.       . metFORMIN (GLUCOPHAGE) 1000 MG tablet Take 1,000 mg by mouth 2 (two) times daily.        . Multiple Vitamins-Minerals (MULTIVITAMINS THER. W/MINERALS) TABS Take 1 tablet by mouth daily.        . niacin  (NIASPAN) 500 MG CR tablet Take 1,000 mg by mouth at bedtime.        Marland Kitchen omega-3 acid ethyl esters (LOVAZA) 1 G capsule Take 2 g by mouth 2 (two) times daily.        Marland Kitchen oxybutynin (DITROPAN-XL) 5 MG 24 hr tablet Take 5 mg by mouth daily.        Bertram Gala Glycol-Propyl Glycol (SYSTANE) 0.4-0.3 % SOLN Place 1 drop into the right eye 4 (four) times daily as needed. For dry eyes       . polyethylene glycol powder (MIRALAX) powder Take 17 g by mouth at bedtime.        . vitamin C (ASCORBIC ACID) 500 MG tablet Take 500 mg by mouth 2 (two) times daily.        Marland Kitchen warfarin (COUMADIN) 5 MG tablet Take 1 tablet (5 mg total) by mouth daily.  30 tablet  0   No Known Allergies Active Problems:   * No active hospital problems. *  Vitals:  There were no vitals taken for this visit. Lab results:No results found for this or any previous visit (from the past 24 hour(s)). Radiology Results: No results found. General appearance: alert and cooperative Head: Normocephalic, without obvious abnormality, atraumatic Eyes: negative Throat: Dental caries, periodontitis Neck: no adenopathy, supple, symmetrical, trachea midline and thyroid not enlarged, symmetric, no tenderness/mass/nodules Resp: clear to auscultation bilaterally Cardio: regular rate and rhythm, S1, S2 normal, no murmur, click, rub or gallop  Assessment:Non-restorable teeth x 8.  Plan: Dental extractions. General anesthesia, day surgery.   Georgia LopesJENSEN,Aviyah Swetz M 05/24/2013

## 2013-05-27 NOTE — Pre-Procedure Instructions (Signed)
Frank FullingSteven D [name]  05/27/2013   Your procedure is scheduled on:  January 27  [facility]A" [address] at 07:00 AM.  Call this number if you have problems the morning of surgery: [phone]   Remember:   Do not eat food or drink liquids after midnight.   Take these medicines the morning of surgery with A SIP OF WATER: Tylenol, Baclofen, Eye drops, Depakote, Lexapro, Pepcid,      Do not wear jewelry, make-up or nail polish.  Do not wear lotions, powders, or perfumes. You may wear deodorant.  Do not shave 48 hours prior to surgery. Men may shave face and neck.  Do not bring valuables to the hospital.  [facility] is not responsible                  for any belongings or valuables.               Contacts, dentures or bridgework may not be worn into surgery.  Leave suitcase in the car. After surgery it may be brought to your room.  For patients admitted to the hospital, discharge time is determined by your                treatment team.               Patients discharged the day of surgery will not be allowed to drive  home.  Name and phone number of your driver: NH  Special Instructions: Shower using CHG 2 nights before surgery and the night before surgery.  If you shower the day of surgery use CHG.  Use special wash - you have one bottle of CHG for all showers.  You should use approximately 1/3 of the bottle for each shower.   Please read over the following fact sheets that you were given: Pain Booklet, Coughing and Deep Breathing and Surgical Site Infection Prevention

## 2013-05-29 ENCOUNTER — Encounter (HOSPITAL_COMMUNITY): Payer: Self-pay

## 2013-05-29 ENCOUNTER — Encounter (HOSPITAL_COMMUNITY)
Admission: RE | Admit: 2013-05-29 | Discharge: 2013-05-29 | Disposition: A | Discharge status: Home / Self Care | Payer: Medicare Other | Source: Ambulatory Visit | Attending: Anesthesiology | Admitting: Anesthesiology

## 2013-05-29 ENCOUNTER — Encounter (HOSPITAL_COMMUNITY)
Admission: RE | Admit: 2013-05-29 | Discharge: 2013-05-29 | Disposition: A | Discharge status: Home / Self Care | Payer: Medicare Other | Source: Ambulatory Visit | Attending: Oral Surgery | Admitting: Oral Surgery

## 2013-05-29 DIAGNOSIS — Z87891 Personal history of nicotine dependence: Secondary | ICD-10-CM | POA: Diagnosis not present

## 2013-05-29 DIAGNOSIS — E785 Hyperlipidemia, unspecified: Secondary | ICD-10-CM | POA: Diagnosis not present

## 2013-05-29 DIAGNOSIS — D649 Anemia, unspecified: Secondary | ICD-10-CM | POA: Diagnosis not present

## 2013-05-29 DIAGNOSIS — Z7901 Long term (current) use of anticoagulants: Secondary | ICD-10-CM | POA: Diagnosis not present

## 2013-05-29 DIAGNOSIS — I82409 Acute embolism and thrombosis of unspecified deep veins of unspecified lower extremity: Secondary | ICD-10-CM | POA: Diagnosis not present

## 2013-05-29 DIAGNOSIS — Z794 Long term (current) use of insulin: Secondary | ICD-10-CM | POA: Diagnosis not present

## 2013-05-29 DIAGNOSIS — K0530 Chronic periodontitis, unspecified: Secondary | ICD-10-CM | POA: Diagnosis not present

## 2013-05-29 DIAGNOSIS — K219 Gastro-esophageal reflux disease without esophagitis: Secondary | ICD-10-CM | POA: Diagnosis not present

## 2013-05-29 DIAGNOSIS — E119 Type 2 diabetes mellitus without complications: Secondary | ICD-10-CM | POA: Diagnosis not present

## 2013-05-29 DIAGNOSIS — I10 Essential (primary) hypertension: Secondary | ICD-10-CM | POA: Diagnosis not present

## 2013-05-29 DIAGNOSIS — K029 Dental caries, unspecified: Secondary | ICD-10-CM | POA: Diagnosis present

## 2013-05-29 DIAGNOSIS — Z79899 Other long term (current) drug therapy: Secondary | ICD-10-CM | POA: Diagnosis not present

## 2013-05-29 DIAGNOSIS — Z8673 Personal history of transient ischemic attack (TIA), and cerebral infarction without residual deficits: Secondary | ICD-10-CM | POA: Diagnosis not present

## 2013-05-29 HISTORY — DX: Depression, unspecified: F32.A

## 2013-05-29 HISTORY — DX: Presence of other specified devices: Z97.8

## 2013-05-29 HISTORY — DX: Presence of urogenital implants: Z96.0

## 2013-05-29 HISTORY — DX: Gastro-esophageal reflux disease without esophagitis: K21.9

## 2013-05-29 HISTORY — DX: Major depressive disorder, single episode, unspecified: F32.9

## 2013-05-29 LAB — APTT: APTT: 26 s (ref 24–37)

## 2013-05-29 LAB — SURGICAL PCR SCREEN
MRSA, PCR: POSITIVE — AB
Staphylococcus aureus: POSITIVE — AB

## 2013-05-29 LAB — PROTIME-INR
INR: 0.77 (ref 0.00–1.49)
Prothrombin Time: 10.7 seconds — ABNORMAL LOW (ref 11.6–15.2)

## 2013-05-29 MED ORDER — CEFAZOLIN SODIUM-DEXTROSE 2-3 GM-% IV SOLR
2.0000 g | INTRAVENOUS | Status: AC
  Administered 2013-05-30: 2 g via INTRAVENOUS
  Filled 2013-05-29: qty 50

## 2013-05-29 NOTE — Progress Notes (Signed)
Nurse called and spoke with [name] at [facility]. Will start Bactroban ointment tomorrow, DOS.

## 2013-05-29 NOTE — Pre-Procedure Instructions (Signed)
Erin FullingSteven D Mcelwain  05/29/2013   Your procedure is scheduled on:  Tuesday May 30, 2013.  Report to [facility] "A" Admitting at 07:00 AM.  Call this number if you have problems the morning of surgery: [phone]   Remember:   Do not eat food or drink liquids after midnight.   Take these medicines the morning of surgery with A SIP OF WATER: Tylenol, Baclofen, Eye drops, Depakote, Lexapro, Pepcid   Do NOT take any diabetic medications the morning of your surgery   Do not wear jewelry.  Do not wear lotions, powders, or colognes. You may wear deodorant.  Men may shave face and neck.  Do not bring valuables to the hospital.  [facility] is not responsible for any belongings or valuables.               Contacts, dentures or bridgework may not be worn into surgery.  Leave suitcase in the car. After surgery it may be brought to your room.  For patients admitted to the hospital, discharge time is determined by your treatment team.               Patients discharged the day of surgery will not be allowed to drivehome.  Name and phone number of your driver: NH  Special Instructions: Shower using CHG soap tonight and tomorrow morning before surgery .Use special wash - you have one bottle of CHG for all showers.  You should use approximately 1/3 of the bottle for each shower.   Please read over the following fact sheets that you were given: Pain Booklet, Coughing and Deep Breathing and Surgical Site Infection Prevention

## 2013-05-29 NOTE — Progress Notes (Signed)
05/29/13 0857  OBSTRUCTIVE SLEEP APNEA  Score 4 or greater  Results sent to PCP   This patient has screened at risk for sleep apnea using the STOP Bang tool during pre-surgical testing. A score of 4 or greater is at risk for sleep apnea.

## 2013-05-29 NOTE — Progress Notes (Signed)
Dr. Randa EvensJensen's office is notifying pt and wife of time change. They are to be here at 5:30 AM.

## 2013-05-29 NOTE — Progress Notes (Signed)
Patient is from [facility] and was a poor historian during PAT visit. Patient denied having any cardiac or pulmonary issues and when asked if he had a stroke, patient denied that as well. Transporter from facility provided a copy of patient medications and lab work from 05/23/13. A copy was made and placed on chart. When asked if patient was still taking Coumadin patient was unsure. Will obtain INR and PTT labs during PAT visit.

## 2013-05-29 NOTE — Progress Notes (Signed)
After reviewing labs and home medication list Nurse called Pharmacy tech and [facility] no [facility] taking Coumadin.

## 2013-05-30 ENCOUNTER — Encounter (HOSPITAL_COMMUNITY): Payer: Self-pay

## 2013-05-30 ENCOUNTER — Encounter (HOSPITAL_COMMUNITY)
Admission: RE | Disposition: A | Discharge status: Home / Self Care | Payer: Self-pay | Source: Ambulatory Visit | Attending: Oral Surgery

## 2013-05-30 ENCOUNTER — Ambulatory Visit (HOSPITAL_COMMUNITY)
Admission: RE | Admit: 2013-05-30 | Discharge: 2013-05-30 | Disposition: A | Discharge status: Home / Self Care | Payer: Medicare Other | Source: Ambulatory Visit | Attending: Oral Surgery | Admitting: Oral Surgery

## 2013-05-30 ENCOUNTER — Encounter (HOSPITAL_COMMUNITY): Payer: Medicare Other | Admitting: Vascular Surgery

## 2013-05-30 ENCOUNTER — Ambulatory Visit (HOSPITAL_COMMUNITY): Payer: Medicare Other | Admitting: Anesthesiology

## 2013-05-30 DIAGNOSIS — K029 Dental caries, unspecified: Secondary | ICD-10-CM

## 2013-05-30 DIAGNOSIS — Z79899 Other long term (current) drug therapy: Secondary | ICD-10-CM | POA: Insufficient documentation

## 2013-05-30 DIAGNOSIS — I639 Cerebral infarction, unspecified: Secondary | ICD-10-CM

## 2013-05-30 DIAGNOSIS — F3289 Other specified depressive episodes: Secondary | ICD-10-CM

## 2013-05-30 DIAGNOSIS — E785 Hyperlipidemia, unspecified: Secondary | ICD-10-CM

## 2013-05-30 DIAGNOSIS — I82409 Acute embolism and thrombosis of unspecified deep veins of unspecified lower extremity: Secondary | ICD-10-CM

## 2013-05-30 DIAGNOSIS — K219 Gastro-esophageal reflux disease without esophagitis: Secondary | ICD-10-CM | POA: Insufficient documentation

## 2013-05-30 DIAGNOSIS — I10 Essential (primary) hypertension: Secondary | ICD-10-CM

## 2013-05-30 DIAGNOSIS — E119 Type 2 diabetes mellitus without complications: Secondary | ICD-10-CM

## 2013-05-30 DIAGNOSIS — F329 Major depressive disorder, single episode, unspecified: Secondary | ICD-10-CM

## 2013-05-30 DIAGNOSIS — R569 Unspecified convulsions: Secondary | ICD-10-CM

## 2013-05-30 DIAGNOSIS — K0530 Chronic periodontitis, unspecified: Secondary | ICD-10-CM | POA: Insufficient documentation

## 2013-05-30 DIAGNOSIS — Z8673 Personal history of transient ischemic attack (TIA), and cerebral infarction without residual deficits: Secondary | ICD-10-CM | POA: Insufficient documentation

## 2013-05-30 DIAGNOSIS — Z7901 Long term (current) use of anticoagulants: Secondary | ICD-10-CM | POA: Insufficient documentation

## 2013-05-30 DIAGNOSIS — Z87891 Personal history of nicotine dependence: Secondary | ICD-10-CM | POA: Insufficient documentation

## 2013-05-30 DIAGNOSIS — Z794 Long term (current) use of insulin: Secondary | ICD-10-CM | POA: Insufficient documentation

## 2013-05-30 DIAGNOSIS — D649 Anemia, unspecified: Secondary | ICD-10-CM | POA: Insufficient documentation

## 2013-05-30 HISTORY — PX: MULTIPLE EXTRACTIONS WITH ALVEOLOPLASTY: SHX5342

## 2013-05-30 LAB — GLUCOSE, CAPILLARY
GLUCOSE-CAPILLARY: 241 mg/dL — AB (ref 70–99)
GLUCOSE-CAPILLARY: 258 mg/dL — AB (ref 70–99)

## 2013-05-30 SURGERY — MULTIPLE EXTRACTION WITH ALVEOLOPLASTY
Anesthesia: General | Site: Mouth

## 2013-05-30 MED ORDER — MUPIROCIN 2 % EX OINT
TOPICAL_OINTMENT | CUTANEOUS | Status: AC
  Administered 2013-05-30: 07:00:00 via NASAL
  Filled 2013-05-30: qty 22

## 2013-05-30 MED ORDER — FENTANYL CITRATE 0.05 MG/ML IJ SOLN
INTRAMUSCULAR | Status: DC | PRN
  Administered 2013-05-30: 50 ug via INTRAVENOUS
  Administered 2013-05-30: 150 ug via INTRAVENOUS

## 2013-05-30 MED ORDER — PROPOFOL 10 MG/ML IV BOLUS
INTRAVENOUS | Status: AC
  Filled 2013-05-30: qty 20

## 2013-05-30 MED ORDER — ONDANSETRON HCL 4 MG/2ML IJ SOLN
INTRAMUSCULAR | Status: AC
  Filled 2013-05-30: qty 2

## 2013-05-30 MED ORDER — MIDAZOLAM HCL 2 MG/2ML IJ SOLN
INTRAMUSCULAR | Status: AC
  Filled 2013-05-30: qty 2

## 2013-05-30 MED ORDER — LIDOCAINE-EPINEPHRINE 2 %-1:100000 IJ SOLN
INTRAMUSCULAR | Status: DC | PRN
  Administered 2013-05-30: 16 mL via INTRADERMAL

## 2013-05-30 MED ORDER — 0.9 % SODIUM CHLORIDE (POUR BTL) OPTIME
TOPICAL | Status: DC | PRN
  Administered 2013-05-30: 1000 mL

## 2013-05-30 MED ORDER — AMOXICILLIN 500 MG PO CAPS: 500.0000 mg | ORAL_CAPSULE | Freq: Three times a day (TID) | ORAL | Status: DC

## 2013-05-30 MED ORDER — SODIUM CHLORIDE 0.9 % IR SOLN
Status: DC | PRN
  Administered 2013-05-30: 1000 mL

## 2013-05-30 MED ORDER — LIDOCAINE HCL (CARDIAC) 20 MG/ML IV SOLN
INTRAVENOUS | Status: AC
  Filled 2013-05-30: qty 5

## 2013-05-30 MED ORDER — LIDOCAINE-EPINEPHRINE 2 %-1:100000 IJ SOLN
INTRAMUSCULAR | Status: AC
  Filled 2013-05-30: qty 1

## 2013-05-30 MED ORDER — LACTATED RINGERS IV SOLN
INTRAVENOUS | Status: DC
  Administered 2013-05-30: 08:00:00 via INTRAVENOUS

## 2013-05-30 MED ORDER — PROPOFOL 10 MG/ML IV BOLUS
INTRAVENOUS | Status: DC | PRN
  Administered 2013-05-30: 200 mg via INTRAVENOUS

## 2013-05-30 MED ORDER — FENTANYL CITRATE 0.05 MG/ML IJ SOLN
INTRAMUSCULAR | Status: AC
  Filled 2013-05-30: qty 5

## 2013-05-30 MED ORDER — ONDANSETRON HCL 4 MG/2ML IJ SOLN
INTRAMUSCULAR | Status: DC | PRN
  Administered 2013-05-30: 4 mg via INTRAVENOUS

## 2013-05-30 MED ORDER — OXYMETAZOLINE HCL 0.05 % NA SOLN
NASAL | Status: DC | PRN
  Administered 2013-05-30: 1 via NASAL

## 2013-05-30 MED ORDER — LIDOCAINE HCL (CARDIAC) 20 MG/ML IV SOLN
INTRAVENOUS | Status: DC | PRN
  Administered 2013-05-30: 60 mg via INTRAVENOUS

## 2013-05-30 MED ORDER — OXYCODONE-ACETAMINOPHEN 5-325 MG PO TABS: 1.0000 | ORAL_TABLET | ORAL | Status: DC | PRN

## 2013-05-30 MED ORDER — SUCCINYLCHOLINE CHLORIDE 20 MG/ML IJ SOLN
INTRAMUSCULAR | Status: DC | PRN
  Administered 2013-05-30: 120 mg via INTRAVENOUS

## 2013-05-30 MED ORDER — MIDAZOLAM HCL 5 MG/5ML IJ SOLN
INTRAMUSCULAR | Status: DC | PRN
  Administered 2013-05-30: 2 mg via INTRAVENOUS

## 2013-05-30 MED ORDER — PHENYLEPHRINE HCL 10 MG/ML IJ SOLN
INTRAMUSCULAR | Status: DC | PRN
  Administered 2013-05-30: 120 ug via INTRAVENOUS

## 2013-05-30 MED ORDER — OXYMETAZOLINE HCL 0.05 % NA SOLN
NASAL | Status: AC
  Filled 2013-05-30: qty 15

## 2013-05-30 SURGICAL SUPPLY — 29 items
BUR CROSS CUT FISSURE 1.6 (BURR) ×2 IMPLANT
BUR CROSS CUT FISSURE 1.6MM (BURR) ×1
BUR EGG ELITE 4.0 (BURR) ×1 IMPLANT
BUR EGG ELITE 4.0MM (BURR) ×1
CANISTER SUCTION 2500CC (MISCELLANEOUS) ×3 IMPLANT
COVER SURGICAL LIGHT HANDLE (MISCELLANEOUS) ×3 IMPLANT
CRADLE DONUT ADULT HEAD (MISCELLANEOUS) ×3 IMPLANT
DECANTER SPIKE VIAL GLASS SM (MISCELLANEOUS) ×1 IMPLANT
GAUZE PACKING FOLDED 2  STR (GAUZE/BANDAGES/DRESSINGS) ×2
GAUZE PACKING FOLDED 2 STR (GAUZE/BANDAGES/DRESSINGS) ×1 IMPLANT
GLOVE BIO SURGEON STRL SZ 6.5 (GLOVE) ×3 IMPLANT
GLOVE BIO SURGEON STRL SZ7.5 (GLOVE) ×3 IMPLANT
GLOVE BIO SURGEONS STRL SZ 6.5 (GLOVE) ×1
GLOVE BIOGEL PI IND STRL 7.0 (GLOVE) ×2 IMPLANT
GLOVE BIOGEL PI INDICATOR 7.0 (GLOVE) ×2
GLOVE SURG SS PI 7.0 STRL IVOR (GLOVE) ×2 IMPLANT
GOWN STRL NON-REIN LRG LVL3 (GOWN DISPOSABLE) ×4 IMPLANT
GOWN STRL REIN XL XLG (GOWN DISPOSABLE) ×5 IMPLANT
KIT BASIN OR (CUSTOM PROCEDURE TRAY) ×3 IMPLANT
KIT ROOM TURNOVER OR (KITS) ×3 IMPLANT
NEEDLE 22X1 1/2 (OR ONLY) (NEEDLE) ×3 IMPLANT
NS IRRIG 1000ML POUR BTL (IV SOLUTION) ×3 IMPLANT
PAD ARMBOARD 7.5X6 YLW CONV (MISCELLANEOUS) ×6 IMPLANT
SUT CHROMIC 3 0 PS 2 (SUTURE) ×5 IMPLANT
SYR CONTROL 10ML LL (SYRINGE) ×3 IMPLANT
TOWEL OR 17X26 10 PK STRL BLUE (TOWEL DISPOSABLE) ×3 IMPLANT
TRAY ENT MC OR (CUSTOM PROCEDURE TRAY) ×3 IMPLANT
TUBING IRRIGATION (MISCELLANEOUS) ×2 IMPLANT
YANKAUER SUCT BULB TIP NO VENT (SUCTIONS) ×3 IMPLANT

## 2013-05-30 NOTE — Transfer of Care (Signed)
Immediate Anesthesia Transfer of Care Note  Patient: Frank FullingSteven D [name]  Procedure(s) Performed: Procedure(s): MULTIPLE EXTRACTION Teeth number Six, Eight, Nine, Ten, Eleven, Thirteen, Nineteen, and Twenty Eight  WITH ALVEOLOPLASTY (N/A)  Patient Location: PACU  Anesthesia Type:General  Level of Consciousness: awake, alert  and oriented  Airway & Oxygen Therapy: Patient Spontanous Breathing and Patient connected to face mask oxygen  Post-op Assessment: Report given to PACU RN and Post -op Vital signs reviewed and stable  Post vital signs: Reviewed and stable  Complications: No apparent anesthesia complications

## 2013-05-30 NOTE — H&P (Signed)
H&P documentation  -History and Physical Reviewed  -Patient has been re-examined  -No change in the plan of care  Jovonda Selner M  

## 2013-05-30 NOTE — Anesthesia Postprocedure Evaluation (Signed)
  Anesthesia Post-op Note  Patient: Erin FullingSteven D Bowlds  Procedure(s) Performed: Procedure(s): MULTIPLE EXTRACTION Teeth number Six, Eight, Nine, Ten, Eleven, Thirteen, Nineteen, and Twenty Eight  WITH ALVEOLOPLASTY (N/A)  Patient Location: PACU  Anesthesia Type:General  Level of Consciousness: awake  Airway and Oxygen Therapy: Patient Spontanous Breathing  Post-op Pain: mild  Post-op Assessment: Post-op Vital signs reviewed  Post-op Vital Signs: Reviewed  Complications: No apparent anesthesia complications

## 2013-05-30 NOTE — Op Note (Signed)
05/30/2013  9:58 AM  PATIENT:  [name]  53 y.o. male  PRE-OPERATIVE DIAGNOSIS:  DENTAL CARIES AND PERIODONTITIS, NON-RESTORABLE TEETH #'S 6, 8, 9, 10, 11, 13, 19, 28  POST-OPERATIVE DIAGNOSIS:  SAME  PROCEDURE:  Procedure(s): MULTIPLE EXTRACTION Teeth number Six, Eight, Nine, Ten, Eleven, Thirteen, Nineteen, and Twenty Eight,  WITH ALVEOLOPLASTY  SURGEON:  Surgeon(s): Georgia LopesScott M Laymond Postle, DDS  ANESTHESIA:   local and general  EBL:  minimal  DRAINS: none   SPECIMEN:  No Specimen  COUNTS:  YES  PLAN OF CARE: Discharge to home after PACU  PATIENT DISPOSITION:  PACU - hemodynamically stable.   PROCEDURE DETAILS: Dictation #[id number]  Georgia LopesScott M. Talita Recht, DMD 05/30/2013 9:58 AM

## 2013-05-30 NOTE — Anesthesia Preprocedure Evaluation (Addendum)
Anesthesia Evaluation  Patient identified by MRN, date of birth, ID band Patient awake    Reviewed: Allergy & Precautions, H&P , NPO status , Patient's Chart, lab work & pertinent test results  Airway Mallampati: II      Dental  (+) Teeth Intact   Pulmonary former smoker,          Cardiovascular hypertension, Pt. on medications     Neuro/Psych    GI/Hepatic GERD-  ,  Endo/Other  diabetes, Poorly Controlled, Type 2, Insulin Dependent  Renal/GU      Musculoskeletal   Abdominal   Peds  Hematology  (+) anemia ,   Anesthesia Other Findings   Reproductive/Obstetrics                          Anesthesia Physical Anesthesia Plan  ASA: III  Anesthesia Plan: General   Post-op Pain Management:    Induction: Intravenous  Airway Management Planned: Nasal ETT  Additional Equipment:   Intra-op Plan:   Post-operative Plan: Extubation in OR  Informed Consent: I have reviewed the patients History and Physical, chart, labs and discussed the procedure including the risks, benefits and alternatives for the proposed anesthesia with the patient or authorized representative who has indicated his/her understanding and acceptance.   Dental advisory given  Plan Discussed with: CRNA, Anesthesiologist and Surgeon  Anesthesia Plan Comments:        Anesthesia Quick Evaluation

## 2013-05-30 NOTE — Preoperative (Signed)
Beta Blockers   Reason not to administer Beta Blockers:Not Applicable 

## 2013-05-30 NOTE — Anesthesia Procedure Notes (Signed)
Procedure Name: Intubation Date/Time: 05/30/2013 9:29 AM Performed by: Quentin OreWALKER, Merridith Dershem E Pre-anesthesia Checklist: Patient identified, Emergency Drugs available, Suction available, Patient being monitored and Timeout performed Patient Re-evaluated:Patient Re-evaluated prior to inductionOxygen Delivery Method: Circle system utilized Preoxygenation: Pre-oxygenation with 100% oxygen Intubation Type: IV induction and Rapid sequence Laryngoscope Size: Mac and 4 Grade View: Grade I Tube type: Oral Tube size: 7.0 mm Number of attempts: 2 Airway Equipment and Method: Stylet Placement Confirmation: ETT inserted through vocal cords under direct vision,  positive ETCO2 and breath sounds checked- equal and bilateral Secured at: 22 cm Tube secured with: Tape Dental Injury: Teeth and Oropharynx as per pre-operative assessment  Comments: DL x 1 with MAC 4, grade I view. Unable to pass 7.5 ETT. 2nd DL with MAC 4, grade I view, 7.0 ETT passed. AOI, +ETCO2 & BBS =.

## 2013-05-31 NOTE — Op Note (Signed)
NAMTillie Fantasia:  [name], [name]                ACCOUNT NO.:  [id number]  MEDICAL RECORD NO.:  [id number]  LOCATION:  [facility]                         FACILITY:  [facility]  PHYSICIAN:  Georgia LopesScott M. Cinde Ebert, M.D.  DATE OF BIRTH:  Jul 30, 1960  DATE OF PROCEDURE:  05/30/2013 DATE OF DISCHARGE:  05/30/2013                              OPERATIVE REPORT   PREOPERATIVE DIAGNOSIS:  Dental caries, periodontitis nonrestorable teeth #6, 8, 9, 10, 11, 13, 19, 28.  POSTOPERATIVE DIAGNOSIS:  Dental caries, periodontitis nonrestorable teeth #6, 8, 9, 10, 11, 13, 19, 28.  PROCEDURE:  Extraction of teeth #6, 8, 9, 10, 11, 13, 19, and 28, Alveoplasty left maxilla.  SURGEON:  Georgia LopesScott M. Braheem Tomasik, MD  ANESTHESIA:  General oral Dr. Randa EvensEdwards attending.  PROCEDURE:  The patient was taken to the operating room, placed on the table in supine position.  General anesthesia was administered intravenously and an oral endotracheal tube was placed and secured.  The eyes were protected.  The patient was draped for the procedure.  Time- out was performed.  The posterior pharynx was suctioned.  A throat pack was placed.  A 2% lidocaine with 1:100,000 epinephrine was infiltrated in an inferior alveolar block on the right and left side and then buccal and palatal infiltration of the maxilla around the teeth to be removed a total of 16 mL was utilized.  A bite block was placed on the right side of the mouth and a sweetheart retractor was used to retract the tongue. A 15 blade was used to make an incision circumferentially around tooth #19 in the mandible and buccally and palatally around teeth #6, 8, 9, 10, 11, 13.  Then, the periosteal elevator was used to reflect the periosteum.  The teeth were elevated with a 301 elevator but it could not be luxated with the dental forceps.  Circumferential bone was then removed using the Stryker handpiece under irrigation.  Tooth #19 required sectioning and removal of roots independently.  Teeth #6, 8,  9 and 10 required circumferential bone removal as did #11, 13 required removal of the crown and sectioning of the roots with the handpiece. The teeth were then removed with the 301 elevator and rongeurs.  The sockets were curetted, and the alveoplasty was performed in the left maxilla using the egg-shaped bur and the bone file.  Then, the area was irrigated and closed with 3-0 chromic.  The bite block and sweetheart retractor repositioned at the other side of the mouth.  A 15 blade was used to make an incision around teeth #28.  Then, the periosteum was reflected.  The tooth was elevated and fractured upon elevation with the crown being removed, then circumferential bone was removed using the Stryker handpiece, then, the tooth was eventually elevated with a 301 elevator and then curetted and irrigated and closed with 3-0 chromic. The oral cavity was then irrigated, suctioned, and inspected, and then the throat pack was removed.  The patient was then awakened, extubated, taken to the recovery room, breathing spontaneously in good condition.  ESTIMATED BLOOD LOSS:  Minimal.  COMPLICATIONS:  None.  SPECIMENS:  None.     Georgia LopesScott M. Tosha Belgarde, M.D.  SMJ/MEDQ  D:  05/30/2013  T:  05/31/2013  Job:  [id number]

## 2013-06-01 ENCOUNTER — Other Ambulatory Visit: Payer: Self-pay | Admitting: *Deleted

## 2013-06-01 ENCOUNTER — Encounter (HOSPITAL_COMMUNITY): Payer: Self-pay | Admitting: Oral Surgery

## 2013-06-01 MED ORDER — OXYCODONE-ACETAMINOPHEN 5-325 MG PO TABS: 1.0000 | ORAL_TABLET | ORAL | Status: DC | PRN

## 2013-07-12 ENCOUNTER — Non-Acute Institutional Stay (SKILLED_NURSING_FACILITY): Payer: PRIVATE HEALTH INSURANCE | Admitting: Internal Medicine

## 2013-07-12 DIAGNOSIS — E785 Hyperlipidemia, unspecified: Secondary | ICD-10-CM

## 2013-07-12 DIAGNOSIS — I6350 Cerebral infarction due to unspecified occlusion or stenosis of unspecified cerebral artery: Secondary | ICD-10-CM

## 2013-07-12 DIAGNOSIS — I639 Cerebral infarction, unspecified: Secondary | ICD-10-CM

## 2013-07-12 DIAGNOSIS — E1149 Type 2 diabetes mellitus with other diabetic neurological complication: Secondary | ICD-10-CM

## 2013-07-12 DIAGNOSIS — R569 Unspecified convulsions: Secondary | ICD-10-CM

## 2013-07-17 NOTE — Progress Notes (Signed)
Patient ID: [name], male   DOB: [birth year], 53 y.o.   MRN: [id number]                  HISTORY & PHYSICAL  DATE:  07/12/2013    [facility]    LEVEL OF CARE:   SNF   Acute Visit   CHIEF COMPLAINT:  Review of general medical issues/transfer note to [facility].    HISTORY OF PRESENT ILLNESS:  [name] is a gentleman who has been in the building since 2007.    He is listed as a poorly controlled type 2 diabetic.    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  He had dysphagia and for a while had a gastrostomy as well as a tracheostomy.  Both of these have long since been removed.    PAST MEDICAL HISTORY/PROBLEM LIST:  Includes:    Diabetic neuropathy.    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  However, we managed this in the facility with antibiotics, debridements, and good wound care.  He does not have significant PAD.     Hypertension.    Hyperlipidemia.    History of a DVT.    History of anemia.    Allergic rhinitis.    CURRENT MEDICATIONS:  Medication list is reviewed.     Pepcid 20 mg daily.    Bayer aspirin 81 q.d.    Colace 100 q.a.m.    Chromagen multivitamin 1 tablet daily.    Vitamin D3, 2000 U daily.    Ditropan XL 5 mg daily.    Lexapro 10 mg daily.    Prinivil 2.5 q.d.   Lovaza 2 capsules by mouth twice daily.    Alphagan ophthalmic 1 drop into each eye twice daily.    Senokot 1 tablet by mouth twice daily.    Tylenol 1000 mg three times a day.    Baclofen 10 mg, 1-1/2 tablets three times a day.    Depakote 500, 1 tablet by mouth every 6 hours.    Niacin ER 500, 2 tablets by mouth at bedtime.    Metformin 1000 twice daily.    Lantus 55 U at bedtime.    NovoLog insulin 4 U before breakfast and 6 U before lunch and dinner.  His CBGs appear to be well controlled.    PHYSICAL EXAMINATION:   VITAL SIGNS:   TEMPERATURE:  98.2.  PULSE:  72.   RESPIRATIONS:  20.   BLOOD  PRESSURE:  Well controlled, 134/86.   WEIGHT:  215 pounds.    CHEST/RESPIRATORY:  Clear air entry bilaterally.   CARDIOVASCULAR:  CARDIAC:   Heart sounds are normal.   GASTROINTESTINAL:  ABDOMEN:   Suprapubic catheter in the right lower quadrant.  Scar from previous PEG tube.   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  Right hand brace with contractures.   PSYCHIATRIC:   MENTAL STATUS:   He has somewhat of a depressed affect.    ASSESSMENT/PLAN:  Type 2 diabetes with diabetic neuropathy.  Last hemoglobin A1c was in November 2014 and it was 6.5.    History of seizure disorder.  On Depakote.  Valproic acid level is controlled.    Hypertension.  Blood pressure appears to be under control on Prinivil.    Depression.  On Lexapro.    Hyperlipidemia.  On Lovaza and Niaspan.  LDL was at goal on 03/29/2013, but he had elevated triglycerides.  The patient is very stable.  His CBGs have really come down and have really been under much better control for most of last year.    CPT CODE: 16109

## 2013-07-28 ENCOUNTER — Other Ambulatory Visit: Payer: Self-pay | Admitting: *Deleted

## 2013-07-28 ENCOUNTER — Other Ambulatory Visit: Payer: Self-pay

## 2013-07-28 MED ORDER — OXYCODONE-ACETAMINOPHEN 5-325 MG PO TABS: ORAL_TABLET | ORAL | Status: DC

## 2013-07-28 NOTE — Telephone Encounter (Signed)
[facility] 

## 2013-07-28 NOTE — Telephone Encounter (Signed)
Rx faxed to [facility] @ [phone], phone number [phone]  

## 2013-08-26 ENCOUNTER — Non-Acute Institutional Stay (SKILLED_NURSING_FACILITY): Payer: PRIVATE HEALTH INSURANCE | Admitting: Internal Medicine

## 2013-08-26 DIAGNOSIS — E1149 Type 2 diabetes mellitus with other diabetic neurological complication: Secondary | ICD-10-CM

## 2013-08-26 DIAGNOSIS — F329 Major depressive disorder, single episode, unspecified: Secondary | ICD-10-CM

## 2013-08-26 DIAGNOSIS — I6350 Cerebral infarction due to unspecified occlusion or stenosis of unspecified cerebral artery: Secondary | ICD-10-CM

## 2013-08-26 DIAGNOSIS — R569 Unspecified convulsions: Secondary | ICD-10-CM

## 2013-08-26 DIAGNOSIS — I639 Cerebral infarction, unspecified: Secondary | ICD-10-CM

## 2013-08-26 DIAGNOSIS — F3289 Other specified depressive episodes: Secondary | ICD-10-CM

## 2013-08-26 NOTE — Progress Notes (Signed)
Patient ID: [name], male   DOB: 10/18/1960, 53 y.o.   MRN: [id number]                  Progress Note  DATE:  07/12/2013    [facility]    LEVEL OF CARE:   SNF   Acute Visit   CHIEF COMPLAINT:  Review of general medical issues/routine [employer] visit for March    HISTORY OF PRESENT ILLNESS:  [name] is a gentleman who has been in the building since 2007.    He is listed as a poorly controlled type 2 diabetic.    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  He had dysphagia and for a while had a gastrostomy as well as a tracheostomy.  Both of these have long since been removed.    The patient complained about hearing loss in his left ear, difficulty with vision in his left eye pain in his left second toe. In addition apparently he has been quite agitated and violent even with family members when he goes home for visits. He is going to [facility] Redmond [name] he is due to see ENT. We have increased his Lexapro from 10-20 mg in the last that he be seen by the consultant psychiatrist in the facility   PAST MEDICAL HISTORY/PROBLEM LIST:  Includes:    Diabetic neuropathy.    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  However, we managed this in the facility with antibiotics, debridements, and good wound care.  He does not have significant PAD.     Hypertension.    Hyperlipidemia.    History of a DVT.    History of anemia.    Allergic rhinitis.    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     Pepcid 20 mg daily.    Bayer aspirin 81 q.d.    Colace 100 q.a.m.    Chromagen multivitamin 1 tablet daily.    Vitamin D3, 2000 U daily.    Ditropan XL 5 mg daily.    Lexapro 20  mg daily.    Prinivil 2.5 q.d.   Lovaza 2 capsules by mouth twice daily.    Alphagan ophthalmic 1 drop into each eye twice daily.     Senokot 1 tablet by mouth twice daily.    Tylenol 1000 mg three times a day.    Baclofen 10 mg, 1-1/2 tablets three times a day.    Depakote 500, 1 tablet by mouth every 6 hours.    Niacin ER 500, 2 tablets by mouth at bedtime.    Metformin 1000 twice daily.    Lantus 55 U at bedtime.    NovoLog insulin 4 U before breakfast and 6 U before lunch and dinner.  His CBGs appear to be well controlled.    PHYSICAL EXAMINATION:   VITAL SIGNS:   TEMPERATURE:  98.2.  PULSE:  82.   RESPIRATIONS:  20.   BLOOD PRESSURE:  Well controlled, 120/86.   WEIGHT:  215 pounds.    O2 sat is 95% on room air  CHEST/RESPIRATORY:  Clear air entry bilaterally.   CARDIOVASCULAR:  CARDIAC:   Heart sounds are normal.   I have looked at both of his feet I see no reason for pain in the left second toe. His peripheral pulses are reduced however his toes do not appear ischemic. I  see no evidence of an active arthritis.this could be a neuropathic pain  GASTROINTESTINAL:  ABDOMEN:   Suprapubic catheter in the right lower quadrant.  Scar from previous PEG tube.   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  Right hand brace with contractures.   PSYCHIATRIC:   MENTAL STATUS:   appears more depressed   ASSESSMENT/PLAN:  Type 2 diabetes with diabetic neuropathy.  Last hemoglobin A1c was in November 2014 and it was 6.5.    History of seizure disorder.  On Depakote.  Valproic acid level is controlled.    Hypertension.  Blood pressure appears to be under control on Prinivil.    Depression.  On Lexapro.   we increased this recently and asked for a psychiatric consult   Hyperlipidemia.  On Lovaza and Niaspan.  LDL was at goal on 03/29/2013, but he had elevated triglycerides.    I note that he is following up with ophthalmology and ENT consultations. I see no obvious cause of significant left foot pain. There are no open wounds no active arthritis. I will see if we did do arterial  studies on him during that time he had wounds on his feet  However they are not currently on the chart plain x-ray of the foot was negative   CPT CODE: [id number]

## 2013-10-16 ENCOUNTER — Non-Acute Institutional Stay (SKILLED_NURSING_FACILITY): Payer: PRIVATE HEALTH INSURANCE | Admitting: Internal Medicine

## 2013-10-16 DIAGNOSIS — E1149 Type 2 diabetes mellitus with other diabetic neurological complication: Secondary | ICD-10-CM

## 2013-10-16 DIAGNOSIS — I10 Essential (primary) hypertension: Secondary | ICD-10-CM

## 2013-10-16 DIAGNOSIS — R569 Unspecified convulsions: Secondary | ICD-10-CM

## 2013-10-23 NOTE — Progress Notes (Addendum)
Patient ID: [name], male   DOB: Sep 20, 1960, 53 y.o.   MRN: [id number]                  PROGRESS NOTE  DATE:  10/16/2013    FAC[facility]    LEVEL OF CARE:   SNF   Routine Visit   CHIEF COMPLAINT:  Review of general medical issues/routine [employer] visit for May.    HISTORY OF PRESENT ILLNESS:  [name] is a gentleman who has been in the building since 2007.    He is a poorly controlled type 2 diabetic.    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  At that time, he had dysphagia and for a while had a gastrostomy tube as well as a tracheostomy.  Both of these have long since been removed.    Two years ago, he had a series of difficult problems with diabetic foot wounds including osteomyelitis.  We managed this with a course of IV vancomycin (I think the cultures were MRSA).  I need to review my records on the complexity of this.  However, fortunately, we have had very few issues with regards to wounds in the past year to two.    Last visit, he was complaining about hearing loss in the last year.  He went to ENT.  He  apparently had left otitis and developed a perforation.    PAST MEDICAL HISTORY/PROBLEM LIST:    Diabetic neuropathy.    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   According to my records, he does not have significant PAD.    Hypertension.    Hyperlipidemia.    History of a DVT.    Allergic rhinitis.    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    Depression.    CURRENT MEDICATIONS:  Medication list is reviewed.     Pepcid 20 q.d.    Bayer aspirin 81 q.d.    Colace 100 b.i.d.    Vitamin D3, 2000 U daily.    Botox injections, followed by Neurology.    Ditropan XL 5 mg daily.    Prinivil 2.5 daily.    Lovaza 2 capsules/2 g b.i.d.    Alphagan ophthalmic 1 drop into each eye b.i.d.    Tylenol 1000 mg three times a day.    Baclofen 15 mg three times a day.    Depakote  500, 1 tablet every 6 hours.    Niaspan ER 1000 at night.    Glucophage 1000 b.i.d.    Lantus 55 U at night.    NovoLog insulin 4 U before breakfast, 6 U before lunch and dinner.    Lexapro 20 q.d. for depression.    LABORATORY DATA:  Recent lab work from March showed:    Hemoglobin 12, white count 4.8.    TSH 1.6.    Hemoglobin A1c 7.    REVIEW OF SYSTEMS:   CHEST/RESPIRATORY:  No cough.  No sputum.   CARDIAC:   No chest pain.   GI:  No nausea or vomiting.   HEENT:  Says the hearing loss in his left ear is significant.  Indeed, it would appear that a hearing aid was possibly suggested.   MUSCULOSKELETAL:  He is complaining about episodic pain in the left foot.  I have reviewed this previously.  The cause of this is not really clear.    PHYSICAL EXAMINATION:   VITAL SIGNS:   TEMPERATURE:  98.2.   PULSE:  82.   RESPIRATIONS:  18.   BLOOD PRESSURE:  120/86.   WEIGHT:  215 pounds.   O2 SATURATIONS:  95% on room air.   CHEST/RESPIRATORY:  Clear air entry bilaterally.   CARDIOVASCULAR:  CARDIAC:   Heart sounds are normal.  There are no murmurs.   GASTROINTESTINAL:  ABDOMEN:   No masses.   MUSCULOSKELETAL:  EXTREMITIES:   BILATERAL LOWER EXTREMITIES:  I looked at both his feet, specifically the left one.  His peripheral pulses are difficult to feel, but there is nothing here that looks ischemic, nothing here that would account for severe pain.  There are no active joints.  I wonder if this discomfort is neuropathic.    ASSESSMENT/PLAN:  Type 2 diabetes with diabetic neuropathy.  Hemoglobin A1c is 7.    History of seizure disorder.  On Depakote.    Hypertension.  Blood pressure appears to be well controlled.    Depression.  On Lexapro.  I think this is really quite stable.    Recently treated for left otitis and hearing loss.  Followed by Dr. Andrey CampanileWilson of ENT.    I see no cause of his foot pain and, although I am not debating that he has some degree of PAD, I do not  think this is the issue.  I think this is more likely to be diabetic neuropathy.

## 2013-11-06 ENCOUNTER — Non-Acute Institutional Stay (SKILLED_NURSING_FACILITY): Payer: PRIVATE HEALTH INSURANCE | Admitting: Internal Medicine

## 2013-11-06 DIAGNOSIS — R569 Unspecified convulsions: Secondary | ICD-10-CM

## 2013-11-06 DIAGNOSIS — E1149 Type 2 diabetes mellitus with other diabetic neurological complication: Secondary | ICD-10-CM

## 2013-11-06 DIAGNOSIS — I10 Essential (primary) hypertension: Secondary | ICD-10-CM

## 2013-11-06 NOTE — Progress Notes (Signed)
Patient ID: [name], male   DOB: [birth year], 53 y.o.   MRN: [id number]                  PROGRESS NOTE  DATE:  11/06/2013   FACILITY: [facility]    LEVEL OF CARE:   SNF   Routine Visit   CHIEF COMPLAINT:  Review of general medical issues/routine [employer] visit for June.    HISTORY OF PRESENT ILLNESS:  [name] is a gentleman who has been in the building since 2007.    He is a poorly controlled type 2 diabetic.    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  At that time, he had dysphagia and for a while had a gastrostomy tube as well as a tracheostomy.  Both of these have long since been removed.    Two years ago, he had a series of difficult problems with diabetic foot wounds including osteomyelitis.  We managed this with a course of IV vancomycin (I think the cultures were MRSA).  I need to review my records on the complexity of this.  However, fortunately, we have had very few issues with regards to wounds in the past year to two.    Last visit, he was complaining about hearing loss in the last year.  He went to ENT.  He  apparently had left otitis and developed a perforation.  He is following up for consideration of a hearing aid. Also he is going for cataract surgery on his left thigh on 11/13/2013. According to the notes the surgeon is in [place]  PAST MEDICAL HISTORY/PROBLEM LIST:    Diabetic neuropathy.    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   According to my records, he does not have significant PAD.    Hypertension.    Hyperlipidemia.    History of a DVT.    Allergic rhinitis.    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    Depression.    CURRENT MEDICATIONS:  Medication list is reviewed.     Pepcid 20 q.d.    Bayer aspirin 81 q.d.    Colace 100 b.i.d.    Vitamin D3, 2000 U daily.    Botox injections, followed by Neurology.    Ditropan XL 5 mg daily.    Prinivil 2.5 daily.     Lovaza 2 capsules/2 g b.i.d.    Alphagan ophthalmic 1 drop into each eye b.i.d.    Tylenol 1000 mg three times a day.    Baclofen 15 mg three times a day.    Depakote 500, 1 tablet every 6 hours.    Niaspan ER 1000 at night.    Glucophage 1000 b.i.d.    Lantus 55 U at night.    NovoLog insulin 4 U before breakfast, 6 U before lunch and dinner.    Lexapro 20 q.d. for depression.    LABORATORY DATA:  Recent lab work from March showed:    Hemoglobin 12, white count 4.8.    TSH 1.6.    Hemoglobin A1c 7.    REVIEW OF SYSTEMS:   CHEST/RESPIRATORY:  No cough.  No sputum.   CARDIAC:   No chest pain.   GI:  No nausea or vomiting.   HEENT:  Says the hearing loss in his left ear is significant.  Indeed, it would appear that a hearing aid was possibly suggested.   MUSCULOSKELETAL:  He is complaining about episodic pain in the  left foot.  I have reviewed this previously.  The cause of this is not really clear.  May be neuropathic  PHYSICAL EXAMINATION:   VITAL SIGNS:   TEMPERATURE:  97.8  PULSE:  84.   RESPIRATIONS:  18.   BLOOD PRESSURE:  124/82.   WEIGHT:  215 pounds.   O2 SATURATIONS:  94% on room air.   CHEST/RESPIRATORY:  Clear air entry bilaterally.   CARDIOVASCULAR:  CARDIAC:   Heart sounds are normal.  There are no murmurs.   GASTROINTESTINAL:  ABDOMEN:   No masses.   MUSCULOSKELETAL:  EXTREMITIES:   BILATERAL LOWER EXTREMITIES:  I looked at both his feet, specifically the left one.  His peripheral pulses are difficult to feel, but there is nothing here that looks ischemic, nothing here that would account for severe pain.  There are no active joints.  I wonder if this discomfort is neuropathic.    ASSESSMENT/PLAN:  Type 2 diabetes with diabetic neuropathy.  Hemoglobin A1c is 7.    History of seizure disorder.  On Depakote.    Hypertension.  Blood pressure appears to be well controlled.    Depression.  On Lexapro.  I think this is really quite stable.     Recently treated for left otitis and hearing loss.  Followed by Dr. Andrey CampanileWilson of ENT.    I see no cause of his foot pain and, although I am not debating that he has some degree of PAD, I do not think this is the issue.  I think this is more likely to be diabetic neuropathy. Recently started on Lyrica

## 2014-01-01 ENCOUNTER — Non-Acute Institutional Stay (SKILLED_NURSING_FACILITY): Payer: PRIVATE HEALTH INSURANCE | Admitting: Internal Medicine

## 2014-01-01 DIAGNOSIS — F329 Major depressive disorder, single episode, unspecified: Secondary | ICD-10-CM

## 2014-01-01 DIAGNOSIS — E1149 Type 2 diabetes mellitus with other diabetic neurological complication: Secondary | ICD-10-CM

## 2014-01-01 DIAGNOSIS — I10 Essential (primary) hypertension: Secondary | ICD-10-CM

## 2014-01-01 DIAGNOSIS — F3289 Other specified depressive episodes: Secondary | ICD-10-CM

## 2014-01-01 NOTE — Progress Notes (Signed)
Patient ID: [name], male   DOB: 01/27/1961, 53 y.o.   MRN: [id number]                  PROGRESS NOTE  DATE:  12/27/2013  FACILITY: [facility]    LEVEL OF CARE:   SNF   Routine Visit   CHIEF COMPLAINT:  Review of general medical issues/routine [facility] visit for July    HISTORY OF PRESENT ILLNESS:  [name] is a gentleman who has been in the building since 2007.    He is a poorly controlled type 2 diabetic.    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  At that time, he had dysphagia and for a while had a gastrostomy tube as well as a tracheostomy.  Both of these have long since been removed.    Two years ago, he had a series of difficult problems with diabetic foot wounds including osteomyelitis.  We managed this with a course of IV vancomycin (I think the cultures were MRSA).  I need to review my records on the complexity of this.  However, fortunately, we have had very few issues with regards to wounds in the past year to two.    Last visit, he was complaining about hearing loss in the last year.  He went to ENT.  He  apparently had left otitis and developed a perforation.  He is following up for consideration of a hearing aid. Also he went for cataract surgery on his Left eye this momth  PAST MEDICAL HISTORY/PROBLEM LIST:    Diabetic neuropathy.    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   According to my records, he does not have significant PAD.    Hypertension.    Hyperlipidemia.    History of a DVT.    Allergic rhinitis.    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    Depression.    CURRENT MEDICATIONS:  Medication list is reviewed.     Pepcid 20 q.d.    Bayer aspirin 81 q.d.    Colace 100 b.i.d.    Vitamin D3, 2000 U daily.    Botox injections, followed by Neurology.    Ditropan XL 5 mg daily.    Prinivil 2.5 daily.    Lovaza 2 capsules/2 g b.i.d.    Alphagan ophthalmic 1 drop into  each eye b.i.d.    Tylenol 1000 mg three times a day.    Baclofen 15 mg three times a day.    Depakote 500, 1 tablet every 6 hours.    Niaspan ER 1000 at night.    Glucophage 1000 b.i.d.    Lantus 55 U at night.    NovoLog insulin 4 U before breakfast, 6 U before lunch and dinner.    Lexapro 20 q.d. for depression.    LABORATORY DATA:  Recent lab work from March showed:    Hemoglobin 12, white count 4.8.    TSH 1.6.    Hemoglobin A1c 7.5 in June  REVIEW OF SYSTEMS:   CHEST/RESPIRATORY:  No cough.  No sputum.   CARDIAC:   No chest pain.   GI:  No nausea or vomiting.   HEENT:  Says the hearing loss in his left ear is significant.  Indeed, it would appear that a hearing aid was possibly suggested.   MUSCULOSKELETAL:  He is complaining about episodic pain in the left foot.  I have reviewed this previously.  The cause  of this is not really clear.  May be neuropathic  PHYSICAL EXAMINATION:   VITAL SIGNS:   TEMPERATURE:  96.3 PULSE:  80   RESPIRATIONS:  120   BLOOD PRESSURE:  136/72.   WEIGHT:  215 pounds.   O2 SATURATIONS:  94% on room air.   CHEST/RESPIRATORY:  Clear air entry bilaterally.   CARDIOVASCULAR:  CARDIAC:   Heart sounds are normal.  There are no murmurs.   GASTROINTESTINAL:  ABDOMEN:   No masses.   MUSCULOSKELETAL:  EXTREMITIES:   BILATERAL LOWER EXTREMITIES:  I looked at both his feet, specifically the left one.  His peripheral pulses are difficult to feel, but there is nothing here that looks ischemic, nothing here that would account for severe pain.  There are no active joints.  I wonder if this discomfort is neuropathic.    ASSESSMENT/PLAN:  Type 2 diabetes with diabetic neuropathy.  Hemoglobin A1c is 7. 5 in June 2015   History of seizure disorder.  On Depakote.    Hypertension.  Blood pressure appears to be well controlled.    Depression.  On Lexapro.  I think this is really quite stable.    Recently treated for left otitis and hearing loss.   Followed by Dr. Andrey Campanile of ENT.

## 2014-03-26 ENCOUNTER — Non-Acute Institutional Stay (SKILLED_NURSING_FACILITY): Payer: PRIVATE HEALTH INSURANCE | Admitting: Internal Medicine

## 2014-03-26 DIAGNOSIS — I10 Essential (primary) hypertension: Secondary | ICD-10-CM

## 2014-03-26 DIAGNOSIS — R569 Unspecified convulsions: Secondary | ICD-10-CM

## 2014-03-26 DIAGNOSIS — E1142 Type 2 diabetes mellitus with diabetic polyneuropathy: Secondary | ICD-10-CM

## 2014-04-02 NOTE — Progress Notes (Addendum)
Patient ID: [name], male   DOB: 1960/09/03, 53 y.o.   MRN: [id number]              PROGRESS NOTE  DATE:  03/26/2014    FACILITY: [facility]    LEVEL OF CARE:   SNF   Routine Visit   CHIEF COMPLAINT:  Review of general medical issues, [facility] visit.    HISTORY OF PRESENT ILLNESS:  This is a gentleman who suffered a left basal ganglia hemorrhagic infarction in 2007.   At that time, he was very disabled and had dysphagia and a gastrostomy tube as well as a tracheostomy.  Both of these have long since been removed.    He is a type 2 diabetic with neuropathy.  Two years ago, he had a difficult series of diabetic foot infections, including osteomyelitis.  We managed all of this in the nursing home and he had protracted courses of IV antibiotics secondary to MRSA osteomyelitis.  All of these closed over, fortunately.  We have not had any recurrence of this.    As I understand things, he has been to see ENT.  He had a left otitis and has been offered consideration of hearing aids.  He also had a left eye cataract surgery in August.    PAST MEDICAL HISTORY/PROBLEM LIST:   Includes:    Diabetic neuropathy.    Diabetic osteomyelitis.    Hypertension.    Hyperlipidemia.    History of a DVT.    Allergic rhinitis.    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    History of depression.  Followed by the in-house psychiatric service.    CURRENT MEDICATIONS:   Medication list is reviewed.     Pepcid 20 mg daily.    Bayer aspirin 81 q.d.    Colace 100 daily.    Vitamin D3, 2000 U daily.    Botox.    Ditropan XL 5 mg daily.    Prinivil 2.5 daily.    Lexapro 20 q.d.    Lovaza 2 capsules/2 g b.i.d.    Alphagan ophthalmic 1 drop into each eye.    Baclofen 10 mg, 1-1/2 tablets/15 mg three times a day.    Lyrica 100 q.8.    Tylenol 500 q.8.    Depakote extended-release 500 q.6.    Niaspan 1000 at bedtime.    Glucophage 1000 b.i.d.     Lantus insulin 60 U at bedtime.    NovoLog 6 U before breakfast and 8 U before lunch and dinner.    REVIEW OF SYSTEMS:     CHEST/RESPIRATORY:  No cough.  No sputum.      CARDIAC:   No chest pain.   GI:  No nausea and vomiting.   MUSCULOSKELETAL:  No complaints of pain.    PHYSICAL EXAMINATION:   VITAL SIGNS:  TEMPERATURE:  98.   PULSE:  80.   RESPIRATIONS:  16.   BLOOD PRESSURE:  126/76.   WEIGHT:  215 pounds.   CHEST/RESPIRATORY:  Clear air entry bilaterally.   CARDIOVASCULAR:  CARDIAC:   Heart sounds are normal.   NEUROLOGICAL:   Neurologic status has been stable.   PSYCHIATRIC:   MENTAL STATUS:   Psychiatric status seems quite good.  His mood and affect are really quite appropriate.    ASSESSMENT/PLAN:      Type 2 diabetes.  I think there have been recent adjustments to his insulin.  His hemoglobin A1c is now slightly  over 7.    History of seizures.  Valproic acid level was 54 in June.  There have been no recent seizures.    Major depression.  This is really quite stable.    Recent cataract surgery.    Hearing loss in the left ear.  Apparently, a hearing aid is planned.    Hypertension.  This appears to be fairly well controlled.  Blood pressure is 126/76.

## 2014-05-21 ENCOUNTER — Other Ambulatory Visit: Payer: Self-pay | Admitting: *Deleted

## 2014-05-21 MED ORDER — OXYCODONE-ACETAMINOPHEN 5-325 MG PO TABS: ORAL_TABLET | ORAL | Status: DC

## 2014-05-21 NOTE — Telephone Encounter (Signed)
[facility] 

## 2014-06-30 ENCOUNTER — Non-Acute Institutional Stay (SKILLED_NURSING_FACILITY): Payer: Medicare Other | Admitting: Internal Medicine

## 2014-06-30 DIAGNOSIS — I10 Essential (primary) hypertension: Secondary | ICD-10-CM

## 2014-06-30 DIAGNOSIS — E1142 Type 2 diabetes mellitus with diabetic polyneuropathy: Secondary | ICD-10-CM

## 2014-06-30 DIAGNOSIS — R569 Unspecified convulsions: Secondary | ICD-10-CM | POA: Diagnosis not present

## 2014-06-30 NOTE — Progress Notes (Signed)
Patient ID: [name], male   DOB: Aug 25, 1960, 54 y.o.   MRN: [id number]              PROGRESS NOTE  DATE: 06/30/2014  [facility]    LEVEL OF CARE:   SNF   Routine Visit   CHIEF COMPLAINT:  Review of general medical issues, [facility] visit.    HISTORY OF PRESENT ILLNESS:  This is a gentleman who suffered a left basal ganglia hemorrhagic infarction in 2007.   At that time, he was very disabled and had dysphagia and a gastrostomy tube as well as a tracheostomy.  Both of these have long since been removed.    He is a type 2 diabetic with neuropathy.  Two years ago, he had a difficult series of diabetic foot infections, including osteomyelitis.  We managed all of this in the nursing home and he had protracted courses of IV antibiotics secondary to MRSA osteomyelitis.  All of these closed over, fortunately.  We have not had any recurrence of this.    As I understand things, he has been to see ENT.  He had a left otitis and has been offered consideration of hearing aids.  He also had a left eye cataract surgery in August.  No other recent issues are recorded  PAST MEDICAL HISTORY/PROBLEM LIST:   Includes:    Diabetic neuropathy.    Diabetic osteomyelitis.    Hypertension.    Hyperlipidemia.    History of a DVT.    Allergic rhinitis.    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    History of depression.  Followed by the in-house psychiatric service.    CURRENT MEDICATIONS:   Medication list is reviewed.     Pepcid 20 mg daily.    Bayer aspirin 81 q.d.    Colace 100 daily.    Vitamin D3, 2000 U daily.    Botox.    Ditropan XL 5 mg daily.    Prinivil 2.5 daily.    Lexapro 20 q.d.    Lovaza 2 capsules/2 g b.i.d.    Alphagan ophthalmic 1 drop into each eye.    Baclofen 10 mg, 1-1/2 tablets/15 mg three times a day.    Lyrica 100 q.8.    Tylenol 500 q.8.    Depakote extended-release 500 q.6.    Niaspan 1000 at bedtime.      Glucophage 1000 b.i.d.    Lantus insulin 65 U at bedtime.    NovoLog 6 U before breakfast and 8 U before lunch and dinner.    Labs 1/6/2016CBC white count 4.8 hemoglobin 12.4 platelet count 161 05/09/2014 comprehensive metabolic panelsodium 137 potassium 4.4 chloride 97 CO2 27 calcium 9.3 albumin 3.7 05/09/2014 valproic acid level 64 04/10/2014 LDL at 38, triglycerides at 291  REVIEW OF SYSTEMS:     CHEST/RESPIRATORY:  No cough.  No sputum.      CARDIAC:   No chest pain.   GI:  No nausea and vomiting.   MUSCULOSKELETAL:  No complaints of pain.    PHYSICAL EXAMINATION:   VITAL SIGNS:  TEMPERATURE:  98.   PULSE:  80.   RESPIRATIONS:  16.   BLOOD PRESSURE:  126/76.   WEIGHT:  215 pounds.   CHEST/RESPIRATORY:  Clear air entry bilaterally.   CARDIOVASCULAR:  CARDIAC:   Heart sounds are normal.   NEUROLOGICAL:   Neurologic status has been stable.   PSYCHIATRIC:   MENTAL STATUS:   Psychiatric status seems  quite good.  His mood and affect are really quite appropriate.    ASSESSMENT/PLAN:      Type 2 diabetes.  I think there have been recent adjustments to his insulin.  His hemoglobin A1c is now slightly over 7. 9  History of seizures.  Valproic acid level was 54 in June.  There have been no recent seizures.    Major depression.  This is really quite stable.    Recent cataract surgery.    Hearing loss in the left ear.  Apparently, a hearing aid is planned.    Hypertension.  This appears to be fairly well controlled.  Blood pressure is 126/76.

## 2014-07-25 ENCOUNTER — Non-Acute Institutional Stay (SKILLED_NURSING_FACILITY): Payer: Medicare Other | Admitting: Internal Medicine

## 2014-07-25 DIAGNOSIS — I10 Essential (primary) hypertension: Secondary | ICD-10-CM

## 2014-07-25 DIAGNOSIS — E1142 Type 2 diabetes mellitus with diabetic polyneuropathy: Secondary | ICD-10-CM | POA: Diagnosis not present

## 2014-08-07 NOTE — Progress Notes (Addendum)
Patient ID: [name], male   DOB: 21-Mar-1961, 54 y.o.   MRN: [id number]                PROGRESS NOTE  DATE:  07/25/2014          FACILITY: [facility]        LEVEL OF CARE:   SNF   Routine Visit                CHIEF COMPLAINT:  Routine visit/[facility] visit.          HISTORY OF PRESENT ILLNESS:  [name] is a gentleman who suffered a left basal ganglia intracerebral hemorrhage in 2007.  At the time, he was very disabled, had a gastrostomy tube as well as a tracheostomy.  Both of these have long since been removed.    He is a type 2 diabetic with neuropathy, on insulin.  Two years ago, we had problems with diabetic foot ulcers including osteomyelitis.  He had courses of IV antibiotics secondary to MRSA.  All of this, fortunately, closed over.  We have not had any recent recurrence of this.    He has recently been to see Ophthalmology.  Diagnosed with dry eyes, also cataracts.  He also has known glaucoma.    He has not had any acute illnesses.    PAST MEDICAL HISTORY/PROBLEM LIST:                   Diabetic neuropathy.    Diabetic osteomyelitis.    Hyperlipidemia.    Hypertension.    History of a DVT.    Allergic rhinitis.    Late-effect of a left basal ganglia hemorrhagic infarction in 2007.    History of depression.  Followed by in-house Psychiatry.    CURRENT MEDICATIONS:  Medication list is reviewed.           Pepcid 20 daily.    ASA 81 q.d.        Multivitamin.    Colace 100 mg daily.    Vitamin D3, 2000 daily.     Botox injections.    Ditropan XL 5 mg daily.    Prinivil 2.5 q.d.        Lexapro 20 q.d.         Omega-3 fatty acids 2 g twice daily.    Alphagan ophthalmic.    Aqua Tears.    Baclofen 10 mg, 1-1/2 tablets/15 mg three times a day.    Lyrica 100 q.8 h.      Tylenol 500 q.8.    Depakote 500, 1 tablet by mouth every 6 hours.    Niaspan 1000 mg at bedtime.    Novolog insulin 6 U before breakfast and 8 U before lunch and  dinner.    Lantus insulin 65 U at night.    Glucophage 1500 in the morning and 1000 at night.    LABORATORY DATA:   Recent lab work shows a hemoglobin A1c of 8.1.    PHYSICAL EXAMINATION:   CHEST/RESPIRATORY:  Exam is clear.        CARDIOVASCULAR:   CARDIAC:  Heart sounds are normal.   NEUROLOGICAL:  Status has been stable.   SENSATION/STRENGTH:  Right hemiparesis.   PSYCHIATRIC:   MENTAL STATUS:  Psychiatric status seems quite good.  He has been up more.  His mother has been a facility patient.    ASSESSMENT/PLAN:              Type 2  diabetes.  On insulin.  Hemoglobin A1c has been slipping upwards.    History of seizures.  On valproic acid.    Major depression.  I think this has really been quite stable.    Recent cataract surgery.  He has tolerated this well.    Hypertension.  His blood pressure has really been under good control.

## 2014-08-27 ENCOUNTER — Non-Acute Institutional Stay (SKILLED_NURSING_FACILITY): Payer: Medicare Other | Admitting: Internal Medicine

## 2014-08-27 DIAGNOSIS — E1142 Type 2 diabetes mellitus with diabetic polyneuropathy: Secondary | ICD-10-CM | POA: Diagnosis not present

## 2014-08-27 DIAGNOSIS — I10 Essential (primary) hypertension: Secondary | ICD-10-CM

## 2014-08-27 DIAGNOSIS — R569 Unspecified convulsions: Secondary | ICD-10-CM | POA: Diagnosis not present

## 2014-08-30 NOTE — Progress Notes (Addendum)
Patient ID: [name], male   DOB: 1961-01-26, 54 y.o.   MRN: [id number]                PROGRESS NOTE  DATE:  08/27/2014          FACILITY: [facility]                          LEVEL OF CARE:   SNF   Routine Visit                    CHIEF COMPLAINT:  Routine visit/[facility] visit.        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  At that time, he was very disabled.  He had a gastrostomy tube as well as a tracheostomy.    Both of these have long since been removed.  This has left him with neurologic disability.  He spends most of his time in bed with a right hemiparesis.  Fortunately, his speech was not affected.      He is also a type 2 diabetic with neuropathy, on insulin.  He has a history of diabetic foot ulcers with osteomyelitis that were very difficult two years ago, although he has not had any of those recently.    He has also recently been to see Ophthalmology, diagnosed with known glaucoma as well as cataracts.    PAST MEDICAL HISTORY/PROBLEM LIST:                Diabetic neuropathy.    Diabetic osteomyelitis.    Hyperlipidemia.    Hypertension.    History of a DVT.    Allergic rhinitis.    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    History of depression.  Followed by Psychiatry here.    LABORATORY DATA:  Recent lab work on 08/01/2014:    White count 4.3, hemoglobin 11.8, platelets 145.  Differential count normal.    Comprehensive metabolic panel:  Sodium 141, potassium 4.2, BUN 16, creatinine 0.77.    Liver function tests normal.        CURRENT MEDICATIONS:  Medication list is reviewed.    PHYSICAL EXAMINATION:   VITAL SIGNS:   O2 SATURATIONS:  95% on room air.     RESPIRATIONS:    18.    PULSE:     70.    WEIGHT:   239 pounds on 08/02/2014.       GENERAL APPEARANCE:  The patient is not in any distress.     CHEST/RESPIRATORY:  Clear air entry  bilaterally.    CARDIOVASCULAR:   CARDIAC:  Heart sounds are normal.  There are no murmurs.     ASSESSMENT/PLAN:                   Type 2 diabetes.  On insulin.  He is also on metformin.  Hemoglobin A1c was 8.1 in March.  His Lantus has been increased to 70 U at h.s. and Humalog at 8 U t.i.d. a.c. meals.  He continues metformin, aspirin, Lovaza, and Niaspan.     Hypertensive heart disease without heart failure.  His blood pressures have mostly been under fairly good control in the 120s to 130s systolic.    History of seizures.  On valproic acid.    History of depression.  I think this has really been quite  stable.     Recent cataract surgery earlier this year.   He tolerated this well.

## 2014-10-01 ENCOUNTER — Non-Acute Institutional Stay (SKILLED_NURSING_FACILITY): Payer: Medicare Other | Admitting: Internal Medicine

## 2014-10-01 DIAGNOSIS — E1142 Type 2 diabetes mellitus with diabetic polyneuropathy: Secondary | ICD-10-CM | POA: Diagnosis not present

## 2014-10-01 DIAGNOSIS — I10 Essential (primary) hypertension: Secondary | ICD-10-CM | POA: Diagnosis not present

## 2014-10-01 NOTE — Progress Notes (Signed)
Patient ID: [name], male   DOB: 09-Apr-1961, 54 y.o.   MRN: [id number]                PROGRESS NOTE  DATE:  09/24/2014          FACILITY: [facility]                          LEVEL OF CARE:   SNF   Routine Visit                    CHIEF COMPLAINT:  Routine visit/[facility] visit.        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  At that time, he was very disabled.  He had a gastrostomy tube as well as a tracheostomy.    Both of these have long since been removed.  This has left him with neurologic disability.  He spends most of his time in bed with a right hemiparesis.  Fortunately, his speech was not affected.      He is also a type 2 diabetic with neuropathy, on insulin.  He has a history of diabetic foot ulcers with osteomyelitis that were very difficult two years ago, although he has not had any of those recently.    He has also recently been to see Ophthalmology, diagnosed with known glaucoma as well as cataracts.    There is not been any significant problems this month    PAST MEDICAL HISTORY/PROBLEM LIST:                Diabetic neuropathy.    Diabetic osteomyelitis.    Hyperlipidemia.    Hypertension.    History of a DVT.    Allergic rhinitis.    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    History of depression.  Followed by Psychiatry here.   Chronic pain followed by a pain management in the facility   LABORATORY DATA:  Recent lab work on 08/01/2014:    White count 4.3, hemoglobin 11.8, platelets 145.  Differential count normal.    Comprehensive metabolic panel:  Sodium 141, potassium 4.2, BUN 16, creatinine 0.77.    Liver function tests normal.   Hemoglobin A1c of 8.7 on 3/1      CURRENT MEDICATIONS:  Medication list is reviewed.    PHYSICAL EXAMINATION:   VITAL SIGNS:   O2 SATURATIONS:  95% on room air.     RESPIRATIONS:    18.    PULSE:      70.    WEIGHT:   239 pounds on 08/02/2014.       GENERAL APPEARANCE:  The patient is not in any distress.     CHEST/RESPIRATORY:  Clear air entry bilaterally.    CARDIOVASCULAR:   CARDIAC:  Heart sounds are normal.  There are no murmurs.     ASSESSMENT/PLAN:                   Type 2 diabetes.  On insulin.  He is also on metformin.  Hemoglobin A1c was 8.1 in March.  His Lantus has been increased to 70 U at h.s. and Humalog at 8 U t.i.d. a.c. meals.  He continues metformin, aspirin, Lovaza, and Niaspan.     Hypertensive heart disease without heart failure.  His blood pressures have mostly been under fairly good control in  the 120s to 130s systolic.    History of seizures.  On valproic acid.    History of depression.  I think this has really been quite stable.     Recent cataract surgery earlier this year.   He tolerated this well.

## 2014-11-12 ENCOUNTER — Non-Acute Institutional Stay (SKILLED_NURSING_FACILITY): Payer: Medicare Other | Admitting: Internal Medicine

## 2014-11-12 DIAGNOSIS — R569 Unspecified convulsions: Secondary | ICD-10-CM | POA: Diagnosis not present

## 2014-11-12 DIAGNOSIS — E1142 Type 2 diabetes mellitus with diabetic polyneuropathy: Secondary | ICD-10-CM

## 2014-11-12 DIAGNOSIS — I10 Essential (primary) hypertension: Secondary | ICD-10-CM

## 2014-11-13 NOTE — Progress Notes (Addendum)
Patient ID: [name], male   DOB: Jan 16, 1961, 54 y.o.   MRN: [id number]                PROGRESS NOTE  DATE:  11/12/2014          FACILITY: [facility]          LEVEL OF CARE:   SNF   Routine Visit             CHIEF COMPLAINT:  Routine visit/[facility] visit.       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  At that time, he was very disabled.  He had a gastrostomy tube as well as a tracheostomy.  Both of these have long since been removed.  The stroke left him with a right hemiparesis.  His speech was not affected.    He is a type 2 diabetic with neuropathy, on insulin.  He has a history of diabetic foot ulcers with osteomyelitis that were very difficult to treat 2-3 years ago.   However, fortunately, he has not had any recurrence of this.    He has also been to see Ophthalmology, diagnosed with glaucoma as well as cataracts.    He has also had hearing loss and has hearing aids.    Last month, he had a rash on his back and saw Dermatology.  Eventually, this responded to prednisone and topical steroids.    PAST MEDICAL HISTORY/PROBLEM LIST:            Diabetic neuropathy and osteomyelitis.    Hypertension.    Hyperlipidemia.    History of a DVT.    Allergic rhinitis.    Late effect of basal ganglia hemorrhage in 2007.    History of depression.  Followed by Psychiatry here.    Chronic pain.  Followed by Pain Management here.    Sensorineural hearing loss with hearing aids.    Glaucoma.    Cataracts.      CURRENT MEDICATIONS:  Medication list is reviewed.            Pepcid 20 q.d.      Bayer aspirin 81 q.d.      Multivitamin 1 tablet daily.    Colace 100 daily.    Ditropan XL 5 mg daily.    Prinivil 2.5 q.d.      Lexapro 20 q.d.      Lovaza 2 g twice daily.    Alphagan ophthalmic.    Baclofen 15 mg three times a day.    Tylenol 500 three times a day.    Lyrica 100 q.8.      Depakote 500 every 6 hours.    Niaspan 500, 3 tablets/1500 mg daily.      Lantus 80 U at night.    Metformin 1000 in the morning, 1500 in the evening.    NovoLog 10 U a.c. meals.      LABORATORY DATA:   Most recent hemoglobin A1c was 8.    REVIEW OF SYSTEMS:    CHEST/RESPIRATORY:  No shortness of breath.   CARDIAC:  No chest pain.   GI:  No abdominal pain.   GU:  No dysuria.   ENDOCRINE:   Very poorly controlled blood sugars, well over 200 fasting and high later in the day.   He was well controlled for a period of time and the cause of this deterioration does not seem completely clear.  PHYSICAL EXAMINATION:   VITAL SIGNS:     WEIGHT:  239 pounds, which has been stable.   CHEST/RESPIRATORY:  Clear air entry bilaterally.    CARDIOVASCULAR:   CARDIAC:  Heart sounds are normal.  There are no murmurs.     ASSESSMENT/PLAN:                   Type 2 diabetes.  I will increase his Lantus to 87 U.  No obvious causes of his deterioration in diabetic control   Hypertension.   This appears to be very well controlled.    History of seizures.  Controlled on valproic acid.    History of depression.  Fairly stable.    Late effect of a dominant intracerebral hemorrhage: Fortunately this has remained stable  Lantus will be increased.  [facility].

## 2015-02-13 ENCOUNTER — Non-Acute Institutional Stay (SKILLED_NURSING_FACILITY): Payer: Medicare Other | Admitting: Internal Medicine

## 2015-02-13 DIAGNOSIS — E1142 Type 2 diabetes mellitus with diabetic polyneuropathy: Secondary | ICD-10-CM | POA: Diagnosis not present

## 2015-02-13 DIAGNOSIS — R569 Unspecified convulsions: Secondary | ICD-10-CM | POA: Diagnosis not present

## 2015-02-13 DIAGNOSIS — I10 Essential (primary) hypertension: Secondary | ICD-10-CM

## 2015-02-20 NOTE — Progress Notes (Signed)
Patient ID: Frank FullingSteven D [name], male   DOB: February 24, 1961, 54 y.o.   MRN: [id number]                PROGRESS NOTE  DATE:  02/13/2015            [facility]                 LEVEL OF CARE:   SNF   Routine Visit              CHIEF COMPLAINT:  Routine visit/[facility] visit.         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   He, at the time, was very disabled.  He had a gastrostomy tube as well as a tracheostomy, both of these long since removed.  The stroke left him with right hemiparesis.  His speech was not affected.    He is a type 2 diabetic, on insulin, with neuropathy.     He also has a history of diabetic foot ulcers with osteomyelitis, which were very difficult to treat three years ago.   However, with wound care and prolonged antibiotics, we managed to get this to resolve.    He has also been to see Ophthalmology for glaucoma and cataracts.    He also has hearing loss and received hearing aids, although I am not really sure he is using them.    PAST MEDICAL HISTORY/PROBLEM LIST:         Diabetic neuropathy and osteomyelitis.    Hypertension.    Hyperlipidemia.    History of a DVT.    Allergic rhinitis.      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    History of depression.  Followed by Psychiatry here.    Chronic pain.  Followed by Pain Management here.     Sensorineural hearing loss with hearing aids.    Glaucoma.    Cataracts.     CURRENT MEDICATIONS:  Medication list is reviewed.        Pepcid 20 q.d.        Bayer aspirin 81 q.d.      Multivitamin daily.     Colace 100 q.a.m.     Botox.  I do not know where this came from.    Ditropan XL 5 mg daily.     Lisinopril 2.5 q.d.      Lexapro 20 q.d.      Lovaza 2 capsules/2 g daily.      Alphagan ophthalmic.    Baclofen 15 mg three times a day.     Lyrica 100 three times a day.     Depakote 500 mg  every 6 hours.    Niaspan 1500 at bedtime.    Vitamin D3, 50,000 U monthly.     Glucophage 1500 in the morning and 1000 at night.    Humalog insulin 10 U three times a day.    Lantus insulin 95 U at night.    LABORATORY DATA:   On 12/20/2014:      CBC:  White count 4.7, hemoglobin 11.9, platelet count 166.     Comprehensive metabolic panel:  Sodium 143, potassium 4, BUN 19, creatinine 0.82.    Albumin 3.8.    Liver function tests:  Normal.     Valproic acid level 46.     01/31/2015:  Hemoglobin A1c 8.1.   Lantus was increased  to 95 U at that point.     REVIEW OF SYSTEMS:    CHEST/RESPIRATORY:  No shortness of breath.     CARDIAC:  No chest pain.   GI:  No abdominal pain.    GU:  No dysuria.    ENDOCRINE:  As noted, not particularly good diabetic control, although it looks as though he has put on weight and he certainly has dietary indiscretions with regards to his diabetes.      PHYSICAL EXAMINATION:   VITAL SIGNS:     TEMPERATURE:  97.4.    PULSE:  66.    RESPIRATIONS:  15.     BLOOD PRESSURE:  140/80.   02 SATURATIONS:  96%.    WEIGHT:  240 pounds, although this has been stable this year.   This is up from 215 pounds in February 2015.    CHEST/RESPIRATORY:  Clear air entry bilaterally.    CARDIOVASCULAR:   CARDIAC:  Heart sounds are normal.  There are no murmurs.    EDEMA/VARICOSITIES:  Extremities:  No edema.  No signs of a DVT.    SKIN:   INSPECTION:  His feet look stable.     ASSESSMENT/PLAN:              Type 2 diabetes with neuropathy.   His Lantus is up to 95 U.  He certainly looks like he has gained more weight than what is recorded.    Hypertension.  Seems to be fairly well controlled.    History of seizures.  Continue on valproic acid.    History of depression.   Followed by Psychiatry.  No recent adjustments.    Chronic pain.  Followed by Pain Management.   No recent adjustments made.       CPT CODE: 62130

## 2015-02-27 ENCOUNTER — Non-Acute Institutional Stay (SKILLED_NURSING_FACILITY): Payer: Medicare Other | Admitting: Internal Medicine

## 2015-02-27 DIAGNOSIS — L97209 Non-pressure chronic ulcer of unspecified calf with unspecified severity: Secondary | ICD-10-CM | POA: Diagnosis not present

## 2015-02-27 DIAGNOSIS — E1151 Type 2 diabetes mellitus with diabetic peripheral angiopathy without gangrene: Secondary | ICD-10-CM | POA: Diagnosis not present

## 2015-02-27 DIAGNOSIS — E11622 Type 2 diabetes mellitus with other skin ulcer: Secondary | ICD-10-CM | POA: Diagnosis not present

## 2015-03-06 ENCOUNTER — Non-Acute Institutional Stay (SKILLED_NURSING_FACILITY): Payer: Medicare Other | Admitting: Internal Medicine

## 2015-03-06 DIAGNOSIS — L97201 Non-pressure chronic ulcer of unspecified calf limited to breakdown of skin: Secondary | ICD-10-CM

## 2015-03-06 DIAGNOSIS — E11622 Type 2 diabetes mellitus with other skin ulcer: Secondary | ICD-10-CM

## 2015-03-06 DIAGNOSIS — E1142 Type 2 diabetes mellitus with diabetic polyneuropathy: Secondary | ICD-10-CM

## 2015-03-06 NOTE — Progress Notes (Addendum)
Patient ID: [name], male   DOB: 1960-07-27, 54 y.o.   MRN: [id number]                PROGRESS NOTE  DATE:  02/27/2015         FACILITY: [facility]                LEVEL OF CARE:   SNF   Acute Visit        CHIEF COMPLAINT:  Left leg ulcer.       HISTORY OF PRESENT ILLNESS:  Mr. Lisenby is a type 2 diabetic.  He has PAD, diabetic neuropathy.    Several years ago, he had diabetic osteomyelitis in both feet, documented by MRI and culture.  He received prolonged IV antibiotics in the facility and, miraculously, these actually healed.    He recently, over the weekend, developed a wound on the left medial lower leg.  There was some question about whether this was trauma, although I do not really think that this panned out and his wife is concerned that this is a diabetic leg ulcer.    Past Medical History  Diagnosis Date  . Diabetes mellitus   . Seizures   . Hypertension   . Hyperlipemia   . Anemia   . DVT (deep venous thrombosis)   . Chronic indwelling Foley catheter   . Stroke     deficit in right arm & right leg  . GERD (gastroesophageal reflux disease)   . Depression     CURRENT MEDICATIONS:  Medication list is reviewed.            Pepcid 20 mg daily.     Bayer aspirin 81 q.d.      Multivitamin daily.    Colace 100 b.i.d.     Ditropan XL 5 q.d.      Prinivil 2.5 q.d.      Lexapro 20 q.d.      Lovaza 1 g twice daily.     Alphagan ophthalmic.    Artificial Tears.    Baclofen 1-1/2 tablets/15 mg three times a day.    Tylenol 500 q.8.    Lyrica 100 q.8.     Depakote 500 every 6 hours.     Niaspan extended release 500 mg, 3 tablets/1500 mg at bedtime.      Vitamin D3, 50,000 U monthly.     Glucophage 1500 every morning and 1000 every evening.     Humalog insulin 10 U three times a day.     Lantus insulin 95 U daily.      PHYSICAL EXAMINATION:   GENERAL APPEARANCE:  The patient is not in any distress.          CHEST/RESPIRATORY:  Clear  air entry bilaterally.    CARDIOVASCULAR:   CARDIAC:  Heart sounds are normal.  There are no murmurs.    GASTROINTESTINAL:   LIVER/SPLEEN/KIDNEYS:  No liver, no spleen.  No tenderness.     GENITOURINARY:   BLADDER:  Not distended.    ARTERIAL:  Extremities:  I cannot feel his peripheral pulses either at the popliteal, dorsalis pedis, or posterior tibial.   SKIN:   INSPECTION:  The area is on the medial aspect of the left leg, roughly the size of a quarter.   This does not look to be unhealthy.   There is some surrounding edema.  However, I am going to try to put a foam cover on this.  No evidence of infection.  ASSESSMENT/PLAN:              Diabetic leg ulcer in the setting of diabetic PAD.   I am going to put collagen with Hydrogel and foam on this, change every second day.  He may need repeat arterial dopplers.  I am pretty sure that these were done two years ago.   If this does not heal, this will be the next step.     CPT CODE: [id number]

## 2015-03-16 NOTE — Progress Notes (Addendum)
Patient ID: ALEGANDRO MACNAUGHTON, male   DOB: June 03, 1960, 54 y.o.   MRN: [id number]                PROGRESS NOTE  DATE:  03/06/2015         FACILITY: [facility]            LEVEL OF CARE:   SNF   Acute Visit           CHIEF COMPLAINT:  Follow up left leg ulcer.     HISTORY OF PRESENT ILLNESS:  Mr. 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  At that time, he was very disabled, but he made a complete recovery.    Two years ago, we had a lot of problems with diabetic foot ulcers with underlying osteomyelitis.  He had prolonged courses of antibiotics.  I think the underlying pathology was Staph aureus.  Fortunately, we have not had a problem with that for quite some time.  Surprisingly, looking back through my records, I cannot actually pinpoint whether he had vascular arterial studies or not.    I saw him last week.  He had developed a wound on the lateral aspect of his left leg, not his foot.  It was unclear how this really happened.  I applied Prisma/hydrogel/foam to the left leg ulcer, which they have been changing every second day.  I am seeing this in follow-up.    Past Medical History  Diagnosis Date  . Diabetes mellitus   . Seizures   . Hypertension   . Hyperlipemia   . Anemia   . DVT (deep venous thrombosis)   . Chronic indwelling Foley catheter   . Stroke     deficit in right arm & right leg  . GERD (gastroesophageal reflux disease)   . Depression              REVIEW OF SYSTEMS:    GENERAL:  He is not complaining of systemic symptoms, fever or chills.   HEENT:    CHEST/RESPIRATORY:  No cough.  No sputum.    CARDIAC:  No chest pain.   GI:  No abdominal pain.   No diarrhea.    GU:  No dysuria.     PHYSICAL EXAMINATION:   VITAL SIGNS:     TEMPERATURE:  98.     PULSE:  72.   RESPIRATIONS:  16.   BLOOD PRESSURE:  136/68.   GENERAL APPEARANCE:  The patient is not in any distress.      CHEST/RESPIRATORY:  Clear air entry bilaterally.    CARDIOVASCULAR:   CARDIAC:  Heart sounds are normal.  There are no murmurs.    GASTROINTESTINAL:   ABDOMEN:  Nontender.  No masses.     LIVER/SPLEEN/KIDNEYS:  No liver, no spleen.   CIRCULATION:   ARTERIAL:  Extremities:  Peripheral pulses are palpable.   SKIN:   INSPECTION:  On the lateral aspect of the left leg, the ulcer looks much better.  This is about the size of a quarter.  It is superficial, but appears to have advancing epithelialization.    ASSESSMENT/PLAN:               Diabetic ulcer on the lateral left leg.  This appears to be improving.  There is no evidence of infection.   Although I cannot see vascular studies from my records in 2013 and 2014, I will see if  the facility can find these.  Fortunately, this wound seems to be improving.  No outside referrals are necessary.     CPT CODE: [id number]

## 2015-10-02 ENCOUNTER — Ambulatory Visit (INDEPENDENT_AMBULATORY_CARE_PROVIDER_SITE_OTHER): Payer: Medicare Other | Admitting: Urology

## 2015-10-02 DIAGNOSIS — Q540 Hypospadias, balanic: Secondary | ICD-10-CM | POA: Diagnosis not present

## 2015-10-02 DIAGNOSIS — N312 Flaccid neuropathic bladder, not elsewhere classified: Secondary | ICD-10-CM | POA: Diagnosis not present

## 2015-10-02 DIAGNOSIS — R32 Unspecified urinary incontinence: Secondary | ICD-10-CM

## 2015-10-04 ENCOUNTER — Other Ambulatory Visit: Payer: Self-pay | Admitting: Urology

## 2015-10-04 DIAGNOSIS — N312 Flaccid neuropathic bladder, not elsewhere classified: Secondary | ICD-10-CM

## 2015-10-04 DIAGNOSIS — R32 Unspecified urinary incontinence: Secondary | ICD-10-CM

## 2015-11-14 ENCOUNTER — Ambulatory Visit (HOSPITAL_COMMUNITY)
Admission: RE | Admit: 2015-11-14 | Discharge: 2015-11-14 | Disposition: A | Discharge status: Home / Self Care | Payer: Medicare Other | Source: Ambulatory Visit | Attending: Urology | Admitting: Urology

## 2015-11-14 DIAGNOSIS — N312 Flaccid neuropathic bladder, not elsewhere classified: Secondary | ICD-10-CM | POA: Diagnosis present

## 2015-11-14 DIAGNOSIS — R32 Unspecified urinary incontinence: Secondary | ICD-10-CM | POA: Insufficient documentation

## 2015-11-20 ENCOUNTER — Ambulatory Visit (INDEPENDENT_AMBULATORY_CARE_PROVIDER_SITE_OTHER): Payer: Medicare Other | Admitting: Urology

## 2015-11-20 DIAGNOSIS — R32 Unspecified urinary incontinence: Secondary | ICD-10-CM | POA: Diagnosis not present

## 2015-11-20 DIAGNOSIS — N310 Uninhibited neuropathic bladder, not elsewhere classified: Secondary | ICD-10-CM | POA: Diagnosis not present

## 2016-01-08 ENCOUNTER — Ambulatory Visit: Payer: Medicare Other | Admitting: Urology

## 2016-04-08 ENCOUNTER — Ambulatory Visit (INDEPENDENT_AMBULATORY_CARE_PROVIDER_SITE_OTHER): Payer: Medicare Other | Admitting: Urology

## 2016-04-08 DIAGNOSIS — N310 Uninhibited neuropathic bladder, not elsewhere classified: Secondary | ICD-10-CM | POA: Diagnosis not present

## 2016-05-01 ENCOUNTER — Ambulatory Visit (INDEPENDENT_AMBULATORY_CARE_PROVIDER_SITE_OTHER): Payer: Medicare Other | Admitting: Urology

## 2016-05-01 ENCOUNTER — Other Ambulatory Visit (HOSPITAL_COMMUNITY)
Admission: RE | Admit: 2016-05-01 | Discharge: 2016-05-01 | Disposition: A | Discharge status: Home / Self Care | Payer: Medicare Other | Source: Other Acute Inpatient Hospital | Attending: Internal Medicine | Admitting: Internal Medicine

## 2016-05-01 DIAGNOSIS — N3000 Acute cystitis without hematuria: Secondary | ICD-10-CM | POA: Diagnosis not present

## 2016-05-03 LAB — URINE CULTURE

## 2016-10-07 ENCOUNTER — Ambulatory Visit (INDEPENDENT_AMBULATORY_CARE_PROVIDER_SITE_OTHER): Payer: Medicare Other | Admitting: Urology

## 2016-10-07 DIAGNOSIS — N310 Uninhibited neuropathic bladder, not elsewhere classified: Secondary | ICD-10-CM | POA: Diagnosis not present

## 2016-10-14 NOTE — H&P (Signed)
HISTORY AND PHYSICAL  [name] is a 56 y.o. male patient referred by general dentist for removal remaining teeth in preparation for dentures.  No diagnosis found.  Past Medical History:  Diagnosis Date  . Anemia   . Chronic indwelling Foley catheter   . Depression   . Diabetes mellitus   . DVT (deep venous thrombosis)   . GERD (gastroesophageal reflux disease)   . Hyperlipemia   . Hypertension   . Seizures   . Stroke    deficit in right arm & right leg    No current facility-administered medications for this encounter.    Current Outpatient Prescriptions  Medication Sig Dispense Refill  . acetaminophen (TYLENOL) 500 MG tablet Take 500 mg by mouth 3 (three) times daily as needed (for pain.).    Marland Kitchen aspirin 81 MG chewable tablet Chew 81 mg by mouth every morning.     . baclofen (LIORESAL) 10 MG tablet Take 15 mg by mouth 3 (three) times daily.      . barrier cream (NON-SPECIFIED) CREA Apply 1 application topically as needed (APPLY AFTER EACH CHANGING).    Marland Kitchen bisacodyl (DULCOLAX) 5 MG EC tablet Take 10 mg by mouth daily as needed (FOR CONSTIPATION).     Marland Kitchen brimonidine (ALPHAGAN) 0.2 % ophthalmic solution Place 1 drop into both eyes 2 (two) times daily.    . divalproex (DEPAKOTE) 500 MG DR tablet Take 500 mg by mouth every 6 (six) hours.      Marland Kitchen escitalopram (LEXAPRO) 20 MG tablet Take 20 mg by mouth every morning.    . ezetimibe (ZETIA) 10 MG tablet Take 10 mg by mouth every morning.    . famotidine (PEPCID) 20 MG tablet Take 20 mg by mouth every morning.     Marland Kitchen Fe-Succ-C-Thre-B12-Des Stomach (MULTIGEN PO) Take 1 tablet by mouth every morning.    Marland Kitchen guaiFENesin-dextromethorphan (ROBITUSSIN DM) 100-10 MG/5ML syrup Take 10 mLs by mouth every 4 (four) hours as needed for cough.    . insulin glargine (LANTUS) 100 UNIT/ML injection Inject 95 Units into the skin every morning.     . insulin lispro (HUMALOG) 100 UNIT/ML injection Inject 12 Units into the skin 3 (three) times daily before  meals.    Marland Kitchen lisinopril (PRINIVIL,ZESTRIL) 2.5 MG tablet Take 2.5 mg by mouth every morning.    . Menthol, Topical Analgesic, (BIOFREEZE EX) Apply 1 application topically 2 (two) times daily as needed (FOR HIP/LEG PAIN). 5% TOPICAL SOLUTION    . metFORMIN (GLUCOPHAGE) 1000 MG tablet Take 1,000-1,500 mg by mouth 2 (two) times daily. 1000 MG IN THE EVENING & 1500 MG IN THE MORNING (1000 &1700)    . omega-3 acid ethyl esters (LOVAZA) 1 G capsule Take 2 g by mouth 2 (two) times daily. (1000&2200)    . oxyCODONE (OXY IR/ROXICODONE) 5 MG immediate release tablet Take 5 mg by mouth every 12 (twelve) hours as needed (for moderate to severe pain.).    Marland Kitchen polyvinyl alcohol (LIQUIFILM TEARS) 1.4 % ophthalmic solution Place 1 drop into both eyes 2 (two) times daily.    . pregabalin (LYRICA) 100 MG capsule Take 100 mg by mouth every 8 (eight) hours.    . rosuvastatin (CRESTOR) 10 MG tablet Take 10 mg by mouth every morning.    . senna-docusate (SENOKOT-S) 8.6-50 MG tablet Take 2 tablets by mouth daily at 10 pm.    . solifenacin (VESICARE) 10 MG tablet Take 10 mg by mouth every morning.    . Vitamin D,  Ergocalciferol, (DRISDOL) 50000 units CAPS capsule Take 50,000 Units by mouth every 30 (thirty) days.     No Known Allergies Active Problems:   * No active hospital problems. *  Vitals: There were no vitals taken for this visit. Lab results:No results found for this or any previous visit (from the past 24 hour(s)). Radiology Results: No results found. General appearance: no distress and Patient in wheelchair Head: Normocephalic, without obvious abnormality, atraumatic Eyes: negative Nose: Nares normal. Septum midline. Mucosa normal. No drainage or sinus tenderness. Throat: teeth # 5 14 remain in maxilla, positive decay; Gross decay madibular teeth # 22, 23, 2, 25, 26, 27, 31. Pharynx clear. Neck: no adenopathy, supple, symmetrical, trachea midline and thyroid not enlarged, symmetric, no  tenderness/mass/nodules Resp: clear to auscultation bilaterally Cardio: regular rate and rhythm, S1, S2 normal, no murmur, click, rub or gallop  Assessment: Nonrestorable teeth secondary to dental caries.  Plan: Full mouth extraction with alveoloplasty. GA. Day surgery.   Georgia LopesJENSEN,Noretta Frier M 10/14/2016

## 2016-10-15 ENCOUNTER — Encounter (HOSPITAL_COMMUNITY): Payer: Self-pay | Admitting: *Deleted

## 2016-10-15 MED ORDER — CEFOTETAN DISODIUM-DEXTROSE 2-2.08 GM-% IV SOLR
2.0000 g | INTRAVENOUS | Status: AC
  Administered 2016-10-16: 2 g via INTRAVENOUS
  Filled 2016-10-15: qty 50

## 2016-10-15 NOTE — Progress Notes (Signed)
I spoke with patients' nurse [name].  [name] has Type II diabetes, last A1C was 5.8- drawn 07/02/16.  For the month of May 2018, patient had 2 CBGs over 220.  [name] has order to hold Insulin and Metformin in am. [name] is a 2 person transfer, has a  superpubic catheter.  I spoke with patients wife [name], she will be at hospital the morning of surgery, she can sign consent.  Patient is  alert and oriented, does become anxious, like thing in a certain order.

## 2016-10-15 NOTE — Progress Notes (Signed)
+  [facility]. I informed her that we need patient to arrive at 0540.  Okey RegalCarol was going to call the transportation company and inform them.  Okey RegalCarol called back and said that transportation company is picking patient up at 0500, the earliest they can and patient should be here by 5:45 AM.

## 2016-10-15 NOTE — Anesthesia Preprocedure Evaluation (Addendum)
Anesthesia Evaluation  Patient identified by MRN, date of birth, ID band Patient confused    Reviewed: Allergy & Precautions, H&P , NPO status , Patient's Chart, lab work & pertinent test results  Airway Mallampati: II  TM Distance: >3 FB Neck ROM: full    Dental  (+) Dental Advidsory Given, Poor Dentition   Pulmonary former smoker,    Pulmonary exam normal breath sounds clear to auscultation       Cardiovascular hypertension, Pt. on medications Normal cardiovascular exam Rhythm:Regular Rate:Normal  ECG: NSR, rate 85   Neuro/Psych Seizures -, Well Controlled,  PSYCHIATRIC DISORDERS Depression CVA    GI/Hepatic GERD  Medicated,  Endo/Other  diabetes, Poorly Controlled, Type 2, Insulin Dependent, Oral Hypoglycemic Agents  Renal/GU      Musculoskeletal   Abdominal   Peds  Hematology  (+) anemia ,   Anesthesia Other Findings neurogenic bladder Hyperlipidemia DVT - Overall left ventricular systolic function was normal. Left ventricular ejection fraction was estimated to be 55 %. There was possible hypokinesis of the posterior wall. Left ventricular wall thickness was mildly to moderately increased. - The aortic valve was mildly calcified. - The right ventricle was mild to moderately dilated. - Trivial posterior effusion  Reproductive/Obstetrics                         Anesthesia Physical  Anesthesia Plan  ASA: III  Anesthesia Plan: General   Post-op Pain Management:    Induction: Intravenous  PONV Risk Score and Plan: 2 and Ondansetron, Dexamethasone and Propofol  Airway Management Planned: Nasal ETT  Additional Equipment:   Intra-op Plan:   Post-operative Plan: Extubation in OR  Informed Consent: I have reviewed the patients History and Physical, chart, labs and discussed the procedure including the risks, benefits and alternatives for the proposed anesthesia with the  patient or authorized representative who has indicated his/her understanding and acceptance.   Dental advisory given  Plan Discussed with: CRNA and Surgeon  Anesthesia Plan Comments:         Anesthesia Quick Evaluation

## 2016-10-16 ENCOUNTER — Ambulatory Visit (HOSPITAL_COMMUNITY)
Admission: RE | Admit: 2016-10-16 | Discharge: 2016-10-16 | Disposition: A | Discharge status: Skilled Nursing Facility | Payer: Medicare Other | Source: Ambulatory Visit | Attending: Oral Surgery | Admitting: Oral Surgery

## 2016-10-16 ENCOUNTER — Ambulatory Visit (HOSPITAL_COMMUNITY): Payer: Medicare Other | Admitting: Anesthesiology

## 2016-10-16 ENCOUNTER — Encounter (HOSPITAL_COMMUNITY)
Admission: RE | Disposition: A | Discharge status: Skilled Nursing Facility | Payer: Self-pay | Source: Ambulatory Visit | Attending: Oral Surgery

## 2016-10-16 ENCOUNTER — Encounter (HOSPITAL_COMMUNITY): Payer: Self-pay | Admitting: Surgery

## 2016-10-16 DIAGNOSIS — I10 Essential (primary) hypertension: Secondary | ICD-10-CM | POA: Diagnosis not present

## 2016-10-16 DIAGNOSIS — Z7982 Long term (current) use of aspirin: Secondary | ICD-10-CM | POA: Insufficient documentation

## 2016-10-16 DIAGNOSIS — Z79899 Other long term (current) drug therapy: Secondary | ICD-10-CM | POA: Diagnosis not present

## 2016-10-16 DIAGNOSIS — Z87891 Personal history of nicotine dependence: Secondary | ICD-10-CM | POA: Diagnosis not present

## 2016-10-16 DIAGNOSIS — I69398 Other sequelae of cerebral infarction: Secondary | ICD-10-CM | POA: Diagnosis not present

## 2016-10-16 DIAGNOSIS — R569 Unspecified convulsions: Secondary | ICD-10-CM | POA: Insufficient documentation

## 2016-10-16 DIAGNOSIS — N319 Neuromuscular dysfunction of bladder, unspecified: Secondary | ICD-10-CM | POA: Diagnosis not present

## 2016-10-16 DIAGNOSIS — K219 Gastro-esophageal reflux disease without esophagitis: Secondary | ICD-10-CM | POA: Insufficient documentation

## 2016-10-16 DIAGNOSIS — Z794 Long term (current) use of insulin: Secondary | ICD-10-CM | POA: Insufficient documentation

## 2016-10-16 DIAGNOSIS — F329 Major depressive disorder, single episode, unspecified: Secondary | ICD-10-CM | POA: Diagnosis not present

## 2016-10-16 DIAGNOSIS — K0530 Chronic periodontitis, unspecified: Secondary | ICD-10-CM | POA: Insufficient documentation

## 2016-10-16 DIAGNOSIS — E119 Type 2 diabetes mellitus without complications: Secondary | ICD-10-CM | POA: Insufficient documentation

## 2016-10-16 DIAGNOSIS — E785 Hyperlipidemia, unspecified: Secondary | ICD-10-CM | POA: Diagnosis not present

## 2016-10-16 DIAGNOSIS — K029 Dental caries, unspecified: Secondary | ICD-10-CM | POA: Insufficient documentation

## 2016-10-16 DIAGNOSIS — Z86718 Personal history of other venous thrombosis and embolism: Secondary | ICD-10-CM | POA: Insufficient documentation

## 2016-10-16 HISTORY — DX: Pneumonia, unspecified organism: J18.9

## 2016-10-16 HISTORY — DX: Neuromuscular dysfunction of bladder, unspecified: N31.9

## 2016-10-16 HISTORY — PX: TOOTH EXTRACTION: SHX859

## 2016-10-16 LAB — SURGICAL PCR SCREEN
MRSA, PCR: POSITIVE — AB
Staphylococcus aureus: POSITIVE — AB

## 2016-10-16 LAB — BASIC METABOLIC PANEL
ANION GAP: 9 (ref 5–15)
BUN: 33 mg/dL — ABNORMAL HIGH (ref 6–20)
CO2: 28 mmol/L (ref 22–32)
CREATININE: 1.34 mg/dL — AB (ref 0.61–1.24)
Calcium: 8.7 mg/dL — ABNORMAL LOW (ref 8.9–10.3)
Chloride: 102 mmol/L (ref 101–111)
GFR, EST NON AFRICAN AMERICAN: 58 mL/min — AB (ref >60)
Glucose, Bld: 73 mg/dL (ref 65–99)
Potassium: 4.1 mmol/L (ref 3.5–5.1)
SODIUM: 139 mmol/L (ref 135–145)

## 2016-10-16 LAB — CBC
HCT: 31.3 % — ABNORMAL LOW (ref 39.0–52.0)
HEMOGLOBIN: 9.4 g/dL — AB (ref 13.0–17.0)
MCH: 26.7 pg (ref 26.0–34.0)
MCHC: 30 g/dL (ref 30.0–36.0)
MCV: 88.9 fL (ref 78.0–100.0)
PLATELETS: 173 10*3/uL (ref 150–400)
RBC: 3.52 MIL/uL — AB (ref 4.22–5.81)
RDW: 16.8 % — ABNORMAL HIGH (ref 11.5–15.5)
WBC: 7.6 10*3/uL (ref 4.0–10.5)

## 2016-10-16 LAB — GLUCOSE, CAPILLARY
Glucose-Capillary: 75 mg/dL (ref 65–99)
Glucose-Capillary: 66 mg/dL (ref 65–99)
Glucose-Capillary: 105 mg/dL — ABNORMAL HIGH (ref 65–99)
Glucose-Capillary: 92 mg/dL (ref 65–99)

## 2016-10-16 SURGERY — DENTAL RESTORATION/EXTRACTIONS
Anesthesia: General | Site: Mouth

## 2016-10-16 MED ORDER — PHENYLEPHRINE HCL 10 MG/ML IJ SOLN
INTRAMUSCULAR | Status: DC | PRN
  Administered 2016-10-16 (×2): 80 ug via INTRAVENOUS

## 2016-10-16 MED ORDER — SODIUM CHLORIDE 0.9 % IR SOLN
Status: DC | PRN
  Administered 2016-10-16: 1000 mL

## 2016-10-16 MED ORDER — MUPIROCIN 2 % EX OINT
1.0000 "application " | TOPICAL_OINTMENT | Freq: Once | CUTANEOUS | Status: AC
  Administered 2016-10-16: 1 via TOPICAL
  Filled 2016-10-16: qty 22

## 2016-10-16 MED ORDER — LIDOCAINE-EPINEPHRINE 2 %-1:100000 IJ SOLN
INTRAMUSCULAR | Status: DC | PRN
  Administered 2016-10-16: 20 mL

## 2016-10-16 MED ORDER — DEXTROSE 50 % IV SOLN
INTRAVENOUS | Status: AC
  Administered 2016-10-16: 12.5 g via INTRAVENOUS
  Filled 2016-10-16: qty 50

## 2016-10-16 MED ORDER — MIDAZOLAM HCL 2 MG/2ML IJ SOLN
INTRAMUSCULAR | Status: AC
  Filled 2016-10-16: qty 2

## 2016-10-16 MED ORDER — ONDANSETRON HCL 4 MG/2ML IJ SOLN
INTRAMUSCULAR | Status: DC | PRN
  Administered 2016-10-16: 4 mg via INTRAVENOUS

## 2016-10-16 MED ORDER — OXYMETAZOLINE HCL 0.05 % NA SOLN
NASAL | Status: AC
  Filled 2016-10-16: qty 15

## 2016-10-16 MED ORDER — LIDOCAINE-EPINEPHRINE 2 %-1:100000 IJ SOLN
INTRAMUSCULAR | Status: AC
  Filled 2016-10-16: qty 1

## 2016-10-16 MED ORDER — LACTATED RINGERS IV SOLN
INTRAVENOUS | Status: DC | PRN
  Administered 2016-10-16 (×2): via INTRAVENOUS

## 2016-10-16 MED ORDER — PROPOFOL 10 MG/ML IV BOLUS
INTRAVENOUS | Status: AC
  Filled 2016-10-16: qty 20

## 2016-10-16 MED ORDER — DEXAMETHASONE SODIUM PHOSPHATE 10 MG/ML IJ SOLN
INTRAMUSCULAR | Status: DC | PRN
  Administered 2016-10-16: 10 mg via INTRAVENOUS

## 2016-10-16 MED ORDER — LACTATED RINGERS IV SOLN
INTRAVENOUS | Status: DC
  Administered 2016-10-16: 07:00:00 via INTRAVENOUS

## 2016-10-16 MED ORDER — PROPOFOL 10 MG/ML IV BOLUS
INTRAVENOUS | Status: DC | PRN
Start: 2016-10-16 — End: 2016-10-16
  Administered 2016-10-16: 150 mg via INTRAVENOUS

## 2016-10-16 MED ORDER — FENTANYL CITRATE (PF) 250 MCG/5ML IJ SOLN
INTRAMUSCULAR | Status: AC
  Filled 2016-10-16: qty 5

## 2016-10-16 MED ORDER — DEXTROSE 50 % IV SOLN
12.5000 g | Freq: Once | INTRAVENOUS | Status: AC
  Administered 2016-10-16: 12.5 g via INTRAVENOUS

## 2016-10-16 MED ORDER — LIDOCAINE HCL (CARDIAC) 20 MG/ML IV SOLN
INTRAVENOUS | Status: DC | PRN
  Administered 2016-10-16: 40 mg via INTRAVENOUS

## 2016-10-16 MED ORDER — SUGAMMADEX SODIUM 200 MG/2ML IV SOLN
INTRAVENOUS | Status: DC | PRN
  Administered 2016-10-16: 204.2 mg via INTRAVENOUS

## 2016-10-16 MED ORDER — OXYMETAZOLINE HCL 0.05 % NA SOLN
NASAL | Status: DC | PRN
  Administered 2016-10-16: 1

## 2016-10-16 MED ORDER — ROCURONIUM BROMIDE 100 MG/10ML IV SOLN
INTRAVENOUS | Status: DC | PRN
  Administered 2016-10-16: 20 mg via INTRAVENOUS

## 2016-10-16 MED ORDER — OXYCODONE HCL 5 MG PO TABS: 5.0000 mg | ORAL_TABLET | ORAL | 0 refills | Status: DC | PRN

## 2016-10-16 MED ORDER — FENTANYL CITRATE (PF) 100 MCG/2ML IJ SOLN
INTRAMUSCULAR | Status: DC | PRN
  Administered 2016-10-16 (×2): 50 ug via INTRAVENOUS

## 2016-10-16 SURGICAL SUPPLY — 34 items
BLADE SURG 15 STRL LF DISP TIS (BLADE) IMPLANT
BLADE SURG 15 STRL SS (BLADE)
BUR CROSS CUT FISSURE 1.6 (BURR) ×1 IMPLANT
BUR CROSS CUT FISSURE 1.6MM (BURR) ×1
BUR EGG ELITE 4.0 (BURR) ×2 IMPLANT
BUR EGG ELITE 4.0MM (BURR) ×1
CANISTER SUCT 3000ML PPV (MISCELLANEOUS) ×3 IMPLANT
COVER SURGICAL LIGHT HANDLE (MISCELLANEOUS) ×3 IMPLANT
GAUZE PACKING FOLDED 2  STR (GAUZE/BANDAGES/DRESSINGS) ×2
GAUZE PACKING FOLDED 2 STR (GAUZE/BANDAGES/DRESSINGS) ×1 IMPLANT
GLOVE BIO SURGEON STRL SZ 6.5 (GLOVE) ×2 IMPLANT
GLOVE BIO SURGEON STRL SZ7 (GLOVE) IMPLANT
GLOVE BIO SURGEON STRL SZ7.5 (GLOVE) ×3 IMPLANT
GLOVE BIO SURGEONS STRL SZ 6.5 (GLOVE) ×1
GLOVE BIOGEL PI IND STRL 6.5 (GLOVE) IMPLANT
GLOVE BIOGEL PI IND STRL 7.0 (GLOVE) ×1 IMPLANT
GLOVE BIOGEL PI INDICATOR 6.5 (GLOVE)
GLOVE BIOGEL PI INDICATOR 7.0 (GLOVE) ×2
GOWN STRL REUS W/ TWL LRG LVL3 (GOWN DISPOSABLE) ×1 IMPLANT
GOWN STRL REUS W/ TWL XL LVL3 (GOWN DISPOSABLE) ×1 IMPLANT
GOWN STRL REUS W/TWL LRG LVL3 (GOWN DISPOSABLE) ×3
GOWN STRL REUS W/TWL XL LVL3 (GOWN DISPOSABLE) ×3
KIT BASIN OR (CUSTOM PROCEDURE TRAY) ×3 IMPLANT
KIT ROOM TURNOVER OR (KITS) ×3 IMPLANT
NEEDLE 22X1 1/2 (OR ONLY) (NEEDLE) ×6 IMPLANT
NS IRRIG 1000ML POUR BTL (IV SOLUTION) ×3 IMPLANT
PAD ARMBOARD 7.5X6 YLW CONV (MISCELLANEOUS) ×3 IMPLANT
SPONGE SURGIFOAM ABS GEL 12-7 (HEMOSTASIS) IMPLANT
SUT CHROMIC 3 0 PS 2 (SUTURE) ×4 IMPLANT
SYR CONTROL 10ML LL (SYRINGE) ×2 IMPLANT
TOWEL GREEN STERILE (TOWEL DISPOSABLE) IMPLANT
TRAY ENT MC OR (CUSTOM PROCEDURE TRAY) ×3 IMPLANT
TUBING IRRIGATION (MISCELLANEOUS) ×3 IMPLANT
YANKAUER SUCT BULB TIP NO VENT (SUCTIONS) ×3 IMPLANT

## 2016-10-16 NOTE — Anesthesia Procedure Notes (Signed)
Procedure Name: Intubation Date/Time: 10/16/2016 8:22 AM Performed by: Neldon Newport Pre-anesthesia Checklist: Timeout performed, Patient being monitored, Suction available, Emergency Drugs available and Patient identified Patient Re-evaluated:Patient Re-evaluated prior to inductionOxygen Delivery Method: Circle system utilized Preoxygenation: Pre-oxygenation with 100% oxygen Intubation Type: IV induction Ventilation: Mask ventilation without difficulty and Oral airway inserted - appropriate to patient size Laryngoscope Size: Mac and 3 Grade View: Grade I Nasal Tubes: Right and Nasal Rae Tube size: 6.5 mm Number of attempts: 1 Placement Confirmation: breath sounds checked- equal and bilateral,  positive ETCO2 and ETT inserted through vocal cords under direct vision Tube secured with: Tape Dental Injury: Teeth and Oropharynx as per pre-operative assessment

## 2016-10-16 NOTE — Anesthesia Postprocedure Evaluation (Addendum)
Anesthesia Post Note  Patient: Frank FullingSteven D [name]  Procedure(s) Performed: Procedure(s) (LRB): DENTAL RESTORATION/EXTRACTIONS NUMBER FIVE, FOURTEEN, TWENTY-TWO, TWENTY- THREE, TWENTY-FOUR, TWENTY-FIVE, TWENTY-SIX, TWENTY-SEVEN, THIRTY-ONE, AND ALVEOLOPLASTY. (N/A)     Patient location during evaluation: PACU Anesthesia Type: General Level of consciousness: awake (baseline) Pain management: pain level controlled Vital Signs Assessment: post-procedure vital signs reviewed and stable Respiratory status: spontaneous breathing, nonlabored ventilation, respiratory function stable and patient connected to nasal cannula oxygen Cardiovascular status: blood pressure returned to baseline and stable Postop Assessment: no signs of nausea or vomiting Anesthetic complications: no    Last Vitals:  Vitals:   10/16/16 1220 10/16/16 1222  BP:  (!) 148/61  Pulse: 88 88  Resp: 15 16  Temp: 36.7 C     Last Pain: There were no vitals filed for this visit.               Ryan P Ellender

## 2016-10-16 NOTE — Transfer of Care (Signed)
Immediate Anesthesia Transfer of Care Note  Patient: Erin FullingSteven D Melvin  Procedure(s) Performed: Procedure(s): DENTAL RESTORATION/EXTRACTIONS NUMBER FIVE, FOURTEEN, TWENTY-TWO, TWENTY- THREE, TWENTY-FOUR, TWENTY-FIVE, TWENTY-SIX, TWENTY-SEVEN, THIRTY-ONE, AND ALVEOLOPLASTY. (N/A)  Patient Location: PACU  Anesthesia Type:General  Level of Consciousness: sedated  Airway & Oxygen Therapy: Patient Spontanous Breathing and Patient connected to nasal cannula oxygen  Post-op Assessment: Report given to RN and Post -op Vital signs reviewed and stable  Post vital signs: Reviewed and stable  Last Vitals:  Vitals:   10/16/16 0659  Temp: 37.2 C    Last Pain: There were no vitals filed for this visit.    Patients Stated Pain Goal: 3 (10/16/16 0719)  Complications: No apparent anesthesia complications

## 2016-10-16 NOTE — Op Note (Signed)
10/16/2016  9:03 AM  PATIENT:  Frank FullingSteven D [name]  56 y.o. male  PRE-OPERATIVE DIAGNOSIS:  NON RESTORABLE TEETH NUMBER FIVE, FOURTEEN, TWENTY-TWO, TWENTY- THREE, TWENTY-FOUR, TWENTY-FIVE, TWENTY-SIX, TWENTY-SEVEN, THIRTY-ONE  POST-OPERATIVE DIAGNOSIS:  SAME  PROCEDURE:  Procedure(s): DENTAL EXTRACTIONS NUMBER FIVE, FOURTEEN, TWENTY-TWO, TWENTY- THREE, TWENTY-FOUR, TWENTY-FIVE, TWENTY-SIX, TWENTY-SEVEN, THIRTY-ONE, AND ALVEOLOPLASTY.  SURGEON:  Surgeon(s): Ocie DoyneJensen, China Deitrick, DDS  ANESTHESIA:   local and general  EBL:  minimal  DRAINS: none   SPECIMEN:  No Specimen  COUNTS:  YES  PLAN OF CARE: Discharge to home after PACU  PATIENT DISPOSITION:  PACU - hemodynamically stable.   PROCEDURE DETAILS: Dictation # [id number] Frank LopesScott M. Torianna [name], DMD 10/16/2016 9:03 AM

## 2016-10-16 NOTE — H&P (Signed)
H&P documentation  -History and Physical Reviewed  -Patient has been re-examined  -No change in the plan of care  Alvina Strother M  

## 2016-10-17 ENCOUNTER — Encounter (HOSPITAL_COMMUNITY): Payer: Self-pay | Admitting: Oral Surgery

## 2016-10-19 NOTE — Op Note (Signed)
NAME:  [name], [name]                     ACCOUNT NO.:  MEDICAL RECORD NO.:  [id number]  LOCATION:                                 FACILITY:  PHYSICIAN:  Georgia LopesScott M. Koby Hartfield, M.D.       DATE OF BIRTH:  DATE OF PROCEDURE:  10/16/2016 DATE OF DISCHARGE:                              OPERATIVE REPORT   PREOPERATIVE DIAGNOSES:  Nonrestorable teeth secondary to dental caries and periodontitis numbers 5, 14, 22, 23, 24, 25, 26, 27, and 31.  POSTOPERATIVE DIAGNOSES:  Nonrestorable teeth secondary to dental caries and periodontitis numbers 5, 14, 22, 23, 24, 25, 26, 27, and 31.  PROCEDURE:  Extraction of teeth numbers 5, 14, 22, 23, 24, 25, 26, 27, 31 and alveoplasty, right and left maxilla and mandible.  SURGEON:  Georgia LopesScott M. Chamaine Stankus, M.D.  ANESTHESIA:  General nasal intubation.  PROCEDURE DESCRIPTION:  The patient was taken to the operating room, placed on the table in supine position.  General anesthesia was administered and nasal endotracheal tube was placed and secured.  The eyes were protected and the patient was draped for the procedure.  Time- out was performed.  The posterior pharynx was suctioned and a throat pack was placed.  A 2% lidocaine with 1:100,000 epinephrine was infiltrated in an inferior alveolar block on the right and left side and buccal and palatally around teeth numbers 5 and 14.  Additional anesthetic solution was deposited in the buccal vestibule of the mandible.  Throat pack having previously been placed, a bite block was placed in the right side of the mouth and a Sweetheart was used to retract the tongue.  A 15 blade was used to make an incision around tooth #14 with proximal and distal extension on the alveolar crest approximately 1.5 cm in either of direction.  Then, a 15 blade was used to make an incision beginning approximately 1 cm proximal to tooth #22, carried forward to the buccal and lingual sulcus to the opposite side until tooth #27 was encountered.   Then, the periosteum was reflected from around these teeth and then, tooth #14 was removed using a 301 elevator and upper universal forceps.  Teeth numbers 22, 23, and 24 were removed using the Asch forceps.  Tooth # 25 and 26 and 27 required additional removal of bone with a Stryker handpiece under irrigation. Once these teeth were removed, the periosteum was reflected in the maxilla and the mandible to expose the alveolar crest and alveoplasty was performed using the egg-shaped bur and bone file.  Then, these areas were irrigated and sutured with 3-0 chromic.  The bite block and Sweetheart retractor were repositioned at the side of mouth and then tooth #5 was addressed.  A 15 blade used to make an incision 1.5 cm proximal and distal to this tooth on the alveolar crest with a circumferential sulcular incision around the tooth.  Then, a similar incision was created around tooth #31, but this distal incision was carried forward to connect with the previous anterior mandible incision. Then, the periosteum was reflected.  The tooth #5 fractured as to tooth #31 upon attempted removal with forceps, then bone was  removed from around these teeth and the teeth were removed using the rongeurs.  Then, the periosteum was reflected to expose the alveolar crest and alveoplasty was performed in the right maxilla and mandible using the egg-shaped bur under irrigation and bone file.  Then, the areas were irrigated and closed with 3-0 chromic.  The oral cavity was inspected and irrigated and suctioned and the throat pack was removed.  The patient was left in care of anesthesia for awakening and transportation to recovery room.  ESTIMATED BLOOD LOSS:  Minimal.  COMPLICATIONS:  None.  DRAINS:  None.  COUNTS:  Correct.  The patient was scheduled for discharge after time in the PACU.     Georgia Lopes, M.D.     SMJ/MEDQ  D:  10/16/2016  T:  10/16/2016  Job:  [id number]

## 2017-03-17 ENCOUNTER — Other Ambulatory Visit: Payer: Self-pay | Admitting: Urology

## 2017-03-17 DIAGNOSIS — N319 Neuromuscular dysfunction of bladder, unspecified: Secondary | ICD-10-CM

## 2017-03-29 ENCOUNTER — Ambulatory Visit (HOSPITAL_COMMUNITY)
Admission: RE | Admit: 2017-03-29 | Discharge: 2017-03-29 | Disposition: A | Discharge status: Home / Self Care | Payer: Medicare Other | Source: Ambulatory Visit | Attending: Urology | Admitting: Urology

## 2017-03-29 DIAGNOSIS — N310 Uninhibited neuropathic bladder, not elsewhere classified: Secondary | ICD-10-CM | POA: Diagnosis not present

## 2017-03-29 DIAGNOSIS — N319 Neuromuscular dysfunction of bladder, unspecified: Secondary | ICD-10-CM

## 2017-03-29 DIAGNOSIS — N2889 Other specified disorders of kidney and ureter: Secondary | ICD-10-CM | POA: Insufficient documentation

## 2017-04-01 ENCOUNTER — Other Ambulatory Visit: Payer: Self-pay

## 2017-04-01 ENCOUNTER — Emergency Department (HOSPITAL_COMMUNITY)
Admission: EM | Admit: 2017-04-01 | Discharge: 2017-04-01 | Disposition: A | Discharge status: Home / Self Care | Payer: Medicare Other | Attending: Emergency Medicine | Admitting: Emergency Medicine

## 2017-04-01 ENCOUNTER — Encounter (HOSPITAL_COMMUNITY): Payer: Self-pay

## 2017-04-01 ENCOUNTER — Encounter (HOSPITAL_COMMUNITY): Payer: Self-pay | Admitting: Emergency Medicine

## 2017-04-01 ENCOUNTER — Emergency Department (HOSPITAL_COMMUNITY)
Admission: EM | Admit: 2017-04-01 | Discharge: 2017-04-01 | Discharge status: Absent without leave | Payer: Medicare Other

## 2017-04-01 DIAGNOSIS — Z7982 Long term (current) use of aspirin: Secondary | ICD-10-CM | POA: Insufficient documentation

## 2017-04-01 DIAGNOSIS — Z87891 Personal history of nicotine dependence: Secondary | ICD-10-CM | POA: Insufficient documentation

## 2017-04-01 DIAGNOSIS — D649 Anemia, unspecified: Secondary | ICD-10-CM | POA: Insufficient documentation

## 2017-04-01 DIAGNOSIS — E119 Type 2 diabetes mellitus without complications: Secondary | ICD-10-CM | POA: Insufficient documentation

## 2017-04-01 DIAGNOSIS — T83010A Breakdown (mechanical) of cystostomy catheter, initial encounter: Secondary | ICD-10-CM | POA: Insufficient documentation

## 2017-04-01 DIAGNOSIS — Z8673 Personal history of transient ischemic attack (TIA), and cerebral infarction without residual deficits: Secondary | ICD-10-CM | POA: Diagnosis not present

## 2017-04-01 DIAGNOSIS — Z79899 Other long term (current) drug therapy: Secondary | ICD-10-CM | POA: Diagnosis not present

## 2017-04-01 DIAGNOSIS — Z794 Long term (current) use of insulin: Secondary | ICD-10-CM | POA: Insufficient documentation

## 2017-04-01 DIAGNOSIS — Y829 Unspecified medical devices associated with adverse incidents: Secondary | ICD-10-CM | POA: Diagnosis not present

## 2017-04-01 DIAGNOSIS — I10 Essential (primary) hypertension: Secondary | ICD-10-CM | POA: Insufficient documentation

## 2017-04-01 HISTORY — DX: Hemiplegia, unspecified affecting unspecified side: G81.90

## 2017-04-01 HISTORY — DX: Obsessive-compulsive disorder, unspecified: F42.9

## 2017-04-01 LAB — URINALYSIS, ROUTINE W REFLEX MICROSCOPIC
Bilirubin Urine: NEGATIVE
GLUCOSE, UA: NEGATIVE mg/dL
KETONES UR: NEGATIVE mg/dL
NITRITE: NEGATIVE
PH: 7 (ref 5.0–8.0)
Protein, ur: NEGATIVE mg/dL
Specific Gravity, Urine: 1.004 — ABNORMAL LOW (ref 1.005–1.030)
Squamous Epithelial / LPF: NONE SEEN

## 2017-04-01 NOTE — ED Provider Notes (Signed)
[facility] EMERGENCY DEPARTMENT Provider Note   CSN: [id number] Arrival date & time: 04/01/17  0901     History   Chief Complaint Chief Complaint  Patient presents with  . needs suprapubic cath replaced    HPI [name] is a 56 y.o. male.  HPI Patient presents to the emergency room to have a suprapubic catheter replaced.  Patient has a history of multiple medical problems.  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  Patient has had a chronic indwelling suprapubic catheter.  At baseline the patient is confused.  At some point last evening he must have accidentally pulled out his suprapubic catheter according [facility].  They sent him to the emergency room to have it replaced.  Patient denies any fevers chills.  No vomiting or diarrhea.  He has had some urine draining from the suprapubic site.  Otherwise, no complaints Past Medical History:  Diagnosis Date  . Anemia   . Chronic indwelling Foley catheter   . Depression   . Diabetes mellitus    Type II  . DVT (deep venous thrombosis) (HCC)   . GERD (gastroesophageal reflux disease)   . Hemiplegia (HCC)   . Hyperlipemia   . Hypertension   . Neurogenic bladder   . OCD (obsessive compulsive disorder)   . Pneumonia   . Seizures (HCC)    since a head injury in high school- no recent sezizures  . Stroke [facility](HCC)    deficit in right arm & right leg    Patient Active Problem List   Diagnosis Date Noted  . Type II or unspecified type diabetes mellitus without mention of complication, not stated as uncontrolled 11/01/2012  . HTN (hypertension) 11/01/2012  . Other and unspecified hyperlipidemia 11/01/2012  . Convulsions/seizures (HCC) 11/01/2012  . CVA (cerebral infarction) 11/01/2012  . Depressive disorder, not elsewhere classified 11/01/2012  . DVT (deep venous thrombosis) (HCC) 11/01/2012    Past Surgical History:  Procedure  Laterality Date  . EYE SURGERY     cataract removal left eye  . MULTIPLE EXTRACTIONS WITH ALVEOLOPLASTY N/A 05/30/2013   Procedure: MULTIPLE EXTRACTION Teeth number Six, Eight, Nine, Ten, Eleven, Thirteen, Nineteen, and Twenty Eight  WITH ALVEOLOPLASTY;  Surgeon: Georgia LopesScott M Jensen, DDS;  Location: [facility] OR;  Service: Oral Surgery;  Laterality: N/A;  . MULTIPLE TOOTH EXTRACTIONS    . SUPRAPUBIC CATHETER INSERTION  2009  . TOOTH EXTRACTION N/A 10/16/2016   Procedure: DENTAL RESTORATION/EXTRACTIONS NUMBER FIVE, FOURTEEN, TWENTY-TWO, TWENTY- THREE, TWENTY-FOUR, TWENTY-FIVE, TWENTY-SIX, TWENTY-SEVEN, THIRTY-ONE, AND ALVEOLOPLASTY.;  Surgeon: Ocie DoyneJensen, Scott, DDS;  Location: [facility] OR;  Service: Oral Surgery;  Laterality: N/A;  . TRACHEOSTOMY  2009   also removal of        Home Medications    Prior to Admission medications   Medication Sig Start Date End Date Taking? Authorizing Provider  acetaminophen (TYLENOL) 500 MG tablet Take 500 mg by mouth 3 (three) times daily as needed (for pain.).   Yes [provider]  aspirin 81 MG chewable tablet Chew 81 mg by mouth every morning.    Yes [provider]  baclofen (LIORESAL) 10 MG tablet Take 15 mg by mouth 3 (three) times daily.     Yes [provider]  barrier cream (NON-SPECIFIED) CREA Apply 1 application topically as needed (APPLY AFTER EACH CHANGING).   Yes [provider]  bisacodyl (DULCOLAX) 5 MG EC tablet Take 10  mg by mouth daily as needed (FOR CONSTIPATION).    Yes [provider]  brimonidine (ALPHAGAN) 0.2 % ophthalmic solution Place 1 drop into both eyes 2 (two) times daily.   Yes [provider]  divalproex (DEPAKOTE) 500 MG DR tablet Take 500 mg by mouth 4 (four) times daily.    Yes [provider]  escitalopram (LEXAPRO) 20 MG tablet Take 20 mg by mouth every morning.   Yes [provider]  ezetimibe (ZETIA) 10 MG tablet Take 10 mg by mouth every morning.   Yes [provider]  famotidine (PEPCID) 20 MG tablet Take 20 mg by mouth every morning.    Yes [provider]  Fe-Succ-C-Thre-B12-Des Stomach (MULTIGEN PO) Take 1 tablet by mouth every morning.   Yes [provider]  ferrous sulfate 325 (65 FE) MG tablet Take 325 mg by mouth daily with breakfast.   Yes [provider]  guaiFENesin-dextromethorphan (ROBITUSSIN DM) 100-10 MG/5ML syrup Take 10 mLs by mouth every 4 (four) hours as needed for cough.   Yes [provider]  insulin glargine (LANTUS) 100 UNIT/ML injection Inject 95 Units into the skin every morning.    Yes [provider]  insulin lispro (HUMALOG) 100 UNIT/ML injection Inject 12 Units into the skin 3 (three) times daily before meals.   Yes [provider]  lisinopril (PRINIVIL,ZESTRIL) 2.5 MG tablet Take 2.5 mg by mouth every morning.   Yes [provider]  Menthol, Topical Analgesic, (BIOFREEZE EX) Apply 1 application topically 2 (two) times daily as needed (FOR HIP/LEG PAIN). 5% TOPICAL SOLUTION   Yes [provider]  metFORMIN (GLUCOPHAGE) 1000 MG tablet Take 1,000-1,500 mg by mouth 2 (two) times daily. 1000 MG IN THE EVENING & 1500 MG IN THE MORNING (1000 &1700)   Yes [provider]  Multiple Vitamins-Minerals (CERTAGEN) tablet Take 1 tablet by mouth daily.   Yes [provider]  omega-3 acid ethyl esters (LOVAZA) 1 G capsule Take 2 g by mouth 2 (two) times daily. (1000&2200)   Yes [provider]  oxyCODONE (OXY IR/ROXICODONE) 5 MG immediate release tablet Take 1 tablet (5 mg total) by mouth every 4 (four) hours as needed for severe pain or breakthrough pain. 10/16/16  Yes Ocie Doyne, DDS  polyvinyl alcohol (LIQUIFILM TEARS) 1.4 % ophthalmic solution Place 1 drop into both eyes 2 (two) times daily.   Yes [provider]  pregabalin (LYRICA) 100 MG capsule Take 100 mg by mouth every 8 (eight) hours.   Yes [provider]    rosuvastatin (CRESTOR) 20 MG tablet Take 20 mg by mouth daily.   Yes [provider]  senna-docusate (SENOKOT-S) 8.6-50 MG tablet Take 2 tablets by mouth daily at 10 pm.   Yes [provider]  solifenacin (VESICARE) 10 MG tablet Take 10 mg by mouth every morning.   Yes [provider]  vitamin C (ASCORBIC ACID) 500 MG tablet Take 500 mg by mouth 2 (two) times daily.   Yes [provider]  Vitamin D, Ergocalciferol, (DRISDOL) 50000 units CAPS capsule Take 50,000 Units by mouth every 30 (thirty) days.   Yes [provider]    Family History No family history on file.  Social History Social History   Tobacco Use  . Smoking status: Former Games developer  . Smokeless tobacco: Never Used  Substance Use Topics  . Alcohol use: No  . Drug use: No     Allergies   No known allergies  Review of Systems Review of Systems  All other systems reviewed and are negative.    Physical Exam Updated Vital Signs BP (!) 157/68 (BP Location: Right Arm)   Pulse 62   Temp 98.4 F (36.9 C) (Oral)   Resp 18   Ht 1.905 m (6\' 3" )   SpO2 98%   BMI 28.12 kg/m   Physical Exam  Constitutional: No distress.  HENT:  Head: Normocephalic and atraumatic.  Right Ear: External ear normal.  Left Ear: External ear normal.  Eyes: Conjunctivae are normal. Right eye exhibits no discharge. Left eye exhibits no discharge. No scleral icterus.  Neck: Neck supple. No tracheal deviation present.  Cardiovascular: Normal rate and regular rhythm.  Pulmonary/Chest: Effort normal. No stridor. No respiratory distress. He has no wheezes.  Abdominal: Soft. He exhibits no distension. There is no tenderness. There is no guarding.  Suprapubic site without erythema but no tenderness  Genitourinary: Penis normal.  Musculoskeletal: He exhibits no edema.  Neurological: He is alert. Cranial nerve deficit: no gross deficits. GCS eye subscore is 4. GCS verbal subscore is 4. GCS motor  subscore is 6.  Right-sided hemiparesis, patient is awake and alert and answering questions  Skin: Skin is warm and dry. No rash noted. He is not diaphoretic.  Psychiatric: He has a normal mood and affect.  Nursing note and vitals reviewed.    ED Treatments / Results   Radiology No results found.  Procedures Procedures (including critical care time)  Medications Ordered in ED Medications - No data to display   Initial Impression / Assessment and Plan / ED Course  I have reviewed the triage vital signs and the nursing notes.  Pertinent labs & imaging results that were available during my care of the patient were reviewed by me and considered in my medical decision making (see chart for details).  Clinical Course as of Apr 01 1137  Thu Apr 01, 2017  [id number] Nurses were unable to get the catheter placed after several attempts in the ED.  They are meeting resistance.  Discussed case with Dr Alvester MorinBell, urology.  He will review the case and call me back.  [JK]  1024 Discussed case with Dr. Alvester MorinBell.  He recommends placing a urethral Foley catheter for now.  The patient will follow-up in the urology office for replacement of the suprapubic catheter  [JK]    Clinical Course User Index [JK] Linwood DibblesKnapp, Quron Ruddy, MD    Patient presented to the emergency room after accidentally pulling out his suprapubic catheter.  We were unable to replace his suprapubic catheter in the emergency room.  I consulted with urology.  Patient had a Foley catheter placed without difficulty.  He will use that temporarily until he can follow-up with his urologist as an outpatient to have them address the suprapubic catheter issue.   Note: Nursing staff ordered a UA.  Pt is not having any sx to suggest infection.  Will hold off on treatment.  Likely colonization with his chronic catheter. Final Clinical Impressions(s) / ED Diagnoses   Final diagnoses:  Suprapubic catheter dysfunction, initial encounter [facility](HCC)    ED Discharge  Orders    None       Linwood DibblesKnapp, Lucynda Rosano, MD 04/01/17 1141

## 2017-04-01 NOTE — ED Triage Notes (Signed)
[facility].  Pt confused and pulled suprapubic cath out.  Pt alert, pleasant, denies pain.

## 2017-04-01 NOTE — ED Triage Notes (Signed)
Pt [facility] em[facility] because he pulled out his suprapubic catheter.  Denies any pain. EMS says pt is confused and unsure when he pulled out his cath.  Per ems staff says confusion is pt's baseline.  Pt alert, pleasant, denies pain.

## 2017-04-01 NOTE — ED Notes (Signed)
Suprapubic catheter attempted to be placed back in by RN. Unsuccessful attempt.  MD notified and MD states he will attempt as well

## 2017-04-01 NOTE — ED Notes (Signed)
Coming from nursing facility in [place]-coming to ED to have super pubic catheter replaced-[facility]

## 2017-04-01 NOTE — Discharge Instructions (Signed)
Schedule an appointment with your urologist, you can continue to use the Foley catheter for now

## 2017-04-02 LAB — URINE CULTURE

## 2017-04-14 ENCOUNTER — Ambulatory Visit (INDEPENDENT_AMBULATORY_CARE_PROVIDER_SITE_OTHER): Payer: Medicare Other | Admitting: Urology

## 2017-04-14 DIAGNOSIS — N310 Uninhibited neuropathic bladder, not elsewhere classified: Secondary | ICD-10-CM | POA: Diagnosis not present

## 2017-06-15 ENCOUNTER — Other Ambulatory Visit: Payer: Self-pay | Admitting: Urology

## 2017-06-23 ENCOUNTER — Other Ambulatory Visit: Payer: Self-pay

## 2017-06-23 ENCOUNTER — Encounter (HOSPITAL_COMMUNITY)
Admission: RE | Disposition: A | Discharge status: Skilled Nursing Facility | Payer: Self-pay | Source: Ambulatory Visit | Attending: Urology

## 2017-06-23 ENCOUNTER — Encounter (HOSPITAL_COMMUNITY): Payer: Self-pay | Admitting: *Deleted

## 2017-06-23 ENCOUNTER — Ambulatory Visit (HOSPITAL_COMMUNITY): Payer: Medicare Other | Admitting: Anesthesiology

## 2017-06-23 ENCOUNTER — Ambulatory Visit (HOSPITAL_COMMUNITY)
Admission: RE | Admit: 2017-06-23 | Discharge: 2017-06-23 | Disposition: A | Discharge status: Skilled Nursing Facility | Payer: Medicare Other | Source: Ambulatory Visit | Attending: Urology | Admitting: Urology

## 2017-06-23 DIAGNOSIS — I6930 Unspecified sequelae of cerebral infarction: Secondary | ICD-10-CM | POA: Diagnosis not present

## 2017-06-23 DIAGNOSIS — R338 Other retention of urine: Secondary | ICD-10-CM

## 2017-06-23 DIAGNOSIS — Z87891 Personal history of nicotine dependence: Secondary | ICD-10-CM | POA: Diagnosis not present

## 2017-06-23 DIAGNOSIS — E1165 Type 2 diabetes mellitus with hyperglycemia: Secondary | ICD-10-CM | POA: Insufficient documentation

## 2017-06-23 DIAGNOSIS — Z86718 Personal history of other venous thrombosis and embolism: Secondary | ICD-10-CM | POA: Diagnosis not present

## 2017-06-23 DIAGNOSIS — I10 Essential (primary) hypertension: Secondary | ICD-10-CM | POA: Insufficient documentation

## 2017-06-23 DIAGNOSIS — Z794 Long term (current) use of insulin: Secondary | ICD-10-CM | POA: Diagnosis not present

## 2017-06-23 DIAGNOSIS — N319 Neuromuscular dysfunction of bladder, unspecified: Secondary | ICD-10-CM | POA: Diagnosis not present

## 2017-06-23 DIAGNOSIS — K219 Gastro-esophageal reflux disease without esophagitis: Secondary | ICD-10-CM | POA: Diagnosis not present

## 2017-06-23 HISTORY — PX: INSERTION OF SUPRAPUBIC CATHETER: SHX5870

## 2017-06-23 LAB — CBC
HCT: 36.9 % — ABNORMAL LOW (ref 39.0–52.0)
Hemoglobin: 11.1 g/dL — ABNORMAL LOW (ref 13.0–17.0)
MCH: 27.1 pg (ref 26.0–34.0)
MCHC: 30.1 g/dL (ref 30.0–36.0)
MCV: 90 fL (ref 78.0–100.0)
PLATELETS: 187 10*3/uL (ref 150–400)
RBC: 4.1 MIL/uL — ABNORMAL LOW (ref 4.22–5.81)
RDW: 15.6 % — ABNORMAL HIGH (ref 11.5–15.5)
WBC: 7.1 10*3/uL (ref 4.0–10.5)

## 2017-06-23 LAB — COMPREHENSIVE METABOLIC PANEL
ALK PHOS: 97 U/L (ref 38–126)
ALT: 10 U/L — ABNORMAL LOW (ref 17–63)
ANION GAP: 10 (ref 5–15)
AST: 12 U/L — ABNORMAL LOW (ref 15–41)
Albumin: 2.8 g/dL — ABNORMAL LOW (ref 3.5–5.0)
BUN: 24 mg/dL — ABNORMAL HIGH (ref 6–20)
CHLORIDE: 100 mmol/L — AB (ref 101–111)
CO2: 30 mmol/L (ref 22–32)
Calcium: 9 mg/dL (ref 8.9–10.3)
Creatinine, Ser: 0.91 mg/dL (ref 0.61–1.24)
GFR calc non Af Amer: >60 mL/min (ref >60)
Glucose, Bld: 100 mg/dL — ABNORMAL HIGH (ref 65–99)
Potassium: 4.6 mmol/L (ref 3.5–5.1)
SODIUM: 140 mmol/L (ref 135–145)
Total Bilirubin: 0.4 mg/dL (ref 0.3–1.2)
Total Protein: 6.6 g/dL (ref 6.5–8.1)

## 2017-06-23 SURGERY — INSERTION, SUPRAPUBIC CATHETER
Anesthesia: General

## 2017-06-23 MED ORDER — LACTATED RINGERS IV SOLN
INTRAVENOUS | Status: DC
  Administered 2017-06-23: 12:00:00 via INTRAVENOUS

## 2017-06-23 MED ORDER — FENTANYL CITRATE (PF) 100 MCG/2ML IJ SOLN: 25.0000 ug | INTRAMUSCULAR | Status: DC | PRN

## 2017-06-23 MED ORDER — MIDAZOLAM HCL 2 MG/2ML IJ SOLN
INTRAMUSCULAR | Status: AC
  Filled 2017-06-23: qty 2

## 2017-06-23 MED ORDER — MIDAZOLAM HCL 2 MG/2ML IJ SOLN
1.0000 mg | INTRAMUSCULAR | Status: AC
  Administered 2017-06-23: 2 mg via INTRAVENOUS

## 2017-06-23 MED ORDER — ONDANSETRON HCL 4 MG/2ML IJ SOLN
4.0000 mg | Freq: Once | INTRAMUSCULAR | Status: AC
Start: 2017-06-23 — End: 2017-06-23
  Administered 2017-06-23: 4 mg via INTRAVENOUS

## 2017-06-23 MED ORDER — ONDANSETRON HCL 4 MG/2ML IJ SOLN
INTRAMUSCULAR | Status: AC
  Filled 2017-06-23: qty 2

## 2017-06-23 MED ORDER — OXYCODONE HCL 5 MG PO TABS
ORAL_TABLET | ORAL | Status: AC
  Filled 2017-06-23: qty 1

## 2017-06-23 MED ORDER — OXYCODONE HCL 10 MG PO TABS: 10.0000 mg | ORAL_TABLET | ORAL | 0 refills | Status: DC | PRN

## 2017-06-23 MED ORDER — FENTANYL CITRATE (PF) 100 MCG/2ML IJ SOLN
INTRAMUSCULAR | Status: AC
  Filled 2017-06-23: qty 2

## 2017-06-23 MED ORDER — OXYCODONE HCL 5 MG PO TABS
5.0000 mg | ORAL_TABLET | Freq: Once | ORAL | Status: AC
  Administered 2017-06-23: 5 mg via ORAL

## 2017-06-23 MED ORDER — PROPOFOL 10 MG/ML IV BOLUS
INTRAVENOUS | Status: DC | PRN
  Administered 2017-06-23: 10 mg via INTRAVENOUS
  Administered 2017-06-23: 140 mg via INTRAVENOUS

## 2017-06-23 MED ORDER — FENTANYL CITRATE (PF) 100 MCG/2ML IJ SOLN
INTRAMUSCULAR | Status: DC | PRN
  Administered 2017-06-23 (×2): 25 ug via INTRAVENOUS

## 2017-06-23 MED ORDER — STERILE WATER FOR IRRIGATION IR SOLN
Status: DC | PRN
  Administered 2017-06-23: 1000 mL

## 2017-06-23 MED ORDER — PHENYLEPHRINE HCL 10 MG/ML IJ SOLN
INTRAMUSCULAR | Status: DC | PRN
  Administered 2017-06-23: 40 ug via INTRAVENOUS
  Administered 2017-06-23: 100 ug via INTRAVENOUS
  Administered 2017-06-23: 60 ug via INTRAVENOUS

## 2017-06-23 MED ORDER — CEFAZOLIN SODIUM-DEXTROSE 2-4 GM/100ML-% IV SOLN
2.0000 g | INTRAVENOUS | Status: AC
  Administered 2017-06-23: 2 g via INTRAVENOUS
  Filled 2017-06-23: qty 100

## 2017-06-23 MED ORDER — LIDOCAINE HCL (CARDIAC) 20 MG/ML IV SOLN
INTRAVENOUS | Status: DC | PRN
  Administered 2017-06-23: 40 mg via INTRAVENOUS

## 2017-06-23 MED ORDER — PROPOFOL 10 MG/ML IV BOLUS
INTRAVENOUS | Status: AC
  Filled 2017-06-23: qty 20

## 2017-06-23 MED ORDER — LIDOCAINE HCL (PF) 1 % IJ SOLN
INTRAMUSCULAR | Status: AC
  Filled 2017-06-23: qty 5

## 2017-06-23 SURGICAL SUPPLY — 47 items
BAG DRAIN URO TABLE W/ADPT NS (DRAPE) ×3 IMPLANT
BAG DRN 8 ADPR NS SKTRN CSTL (DRAPE) ×1
BAG HAMPER (MISCELLANEOUS) ×3 IMPLANT
BAG URINE DRAINAGE (UROLOGICAL SUPPLIES) ×4 IMPLANT
BLADE SURG 15 STRL LF DISP TIS (BLADE) ×1 IMPLANT
BLADE SURG 15 STRL SS (BLADE) ×3
CATH FOLEY 2WAY SLVR  5CC 16FR (CATHETERS)
CATH FOLEY 2WAY SLVR  5CC 18FR (CATHETERS) ×4
CATH FOLEY 2WAY SLVR 5CC 16FR (CATHETERS) IMPLANT
CATH FOLEY 2WAY SLVR 5CC 18FR (CATHETERS) IMPLANT
CLOTH BEACON ORANGE TIMEOUT ST (SAFETY) ×3 IMPLANT
COVER LIGHT HANDLE STERIS (MISCELLANEOUS) ×3 IMPLANT
DECANTER SPIKE VIAL GLASS SM (MISCELLANEOUS) ×3 IMPLANT
DRAPE LAPAROTOMY TRNSV 102X78 (DRAPE) ×2 IMPLANT
ELECT REM PT RETURN 9FT ADLT (ELECTROSURGICAL) ×3
ELECTRODE REM PT RTRN 9FT ADLT (ELECTROSURGICAL) ×1 IMPLANT
GAUZE SPONGE 4X4 12PLY STRL (GAUZE/BANDAGES/DRESSINGS) ×2 IMPLANT
GLOVE BIO SURGEON STRL SZ8 (GLOVE) ×3 IMPLANT
GLOVE BIOGEL M 6.5 STRL (GLOVE) ×2 IMPLANT
GLOVE BIOGEL PI IND STRL 6.5 (GLOVE) IMPLANT
GLOVE BIOGEL PI IND STRL 7.0 (GLOVE) IMPLANT
GLOVE BIOGEL PI INDICATOR 6.5 (GLOVE) ×2
GLOVE BIOGEL PI INDICATOR 7.0 (GLOVE) ×2
GOWN STRL REUS W/ TWL XL LVL3 (GOWN DISPOSABLE) ×1 IMPLANT
GOWN STRL REUS W/TWL LRG LVL3 (GOWN DISPOSABLE) ×3 IMPLANT
GOWN STRL REUS W/TWL XL LVL3 (GOWN DISPOSABLE) ×3
GUIDEWIRE STR DUAL SENSOR (WIRE) ×2 IMPLANT
INTRODUCER SUPERPUBIC 10FR (INTRODUCER) IMPLANT
INTRODUCER SUPERPUBIC 12FR (INTRODUCER) IMPLANT
INTRODUCER SUPERPUBIC 14FR (INTRODUCER) IMPLANT
INTRODUCER SUPERPUBIC 16FR (INTRODUCER) IMPLANT
INTRODUCER SUPRAPUBIC 18FR (INTRODUCER) ×3 IMPLANT
KIT BLADEGUARD II DBL (SET/KITS/TRAYS/PACK) ×3 IMPLANT
KIT ROOM TURNOVER AP CYSTO (KITS) ×3 IMPLANT
KIT SURGICAL DEVON (SET/KITS/TRAYS/PACK) ×3 IMPLANT
MANIFOLD NEPTUNE II (INSTRUMENTS) ×2 IMPLANT
NDL HYPO 25X1 1.5 SAFETY (NEEDLE) ×1 IMPLANT
NEEDLE HYPO 25X1 1.5 SAFETY (NEEDLE) ×3 IMPLANT
PACK CYSTO (CUSTOM PROCEDURE TRAY) ×3 IMPLANT
PAD ARMBOARD 7.5X6 YLW CONV (MISCELLANEOUS) ×3 IMPLANT
SPONGE DRAIN TRACH 4X4 STRL 2S (GAUZE/BANDAGES/DRESSINGS) ×2 IMPLANT
SUT SILK 0 FSL (SUTURE) ×3 IMPLANT
SYR CONTROL 10ML LL (SYRINGE) ×3 IMPLANT
TAPE HYPAFIX 6 X30' (GAUZE/BANDAGES/DRESSINGS) ×1
TAPE HYPAFIX 6X30 (GAUZE/BANDAGES/DRESSINGS) ×1 IMPLANT
WATER STERILE IRR 1000ML POUR (IV SOLUTION) ×2 IMPLANT
WATER STERILE IRR 3000ML UROMA (IV SOLUTION) ×3 IMPLANT

## 2017-06-23 NOTE — Progress Notes (Signed)
Report called to Remo Lippsarol Vanderworth , Charge [facility].

## 2017-06-23 NOTE — Discharge Instructions (Signed)
Suprapubic Catheter Home Guide A suprapubic catheter is a rubber tube used to drain urine from the bladder into a collection bag. The catheter is inserted into the bladder through a small opening in the in the lower abdomen, near the center of the body, above the pubic bone (suprapubic area). There is a tiny balloon filled with germ-free (sterile) water on the end of the catheter that is in the bladder. The balloon helps to keep the catheter in place. Your suprapubic catheter may need to be replaced every 4-6 weeks, or as often as recommended by your health care provider. The collection bag must be emptied every day and cleaned every 2-3 days. The collection bag can be put beside your bed at night and attached to your leg during the day. You may have a large collection bag to use at night and a smaller one to use during the day. What are the risks?  Urine flow can become blocked. This can happen if the catheter is not working correctly, or if you have a blood clot in your bladder or in the catheter.  Tissue near the catheter may can become irritated and bleed.  Bacteria may get into your bladder and cause a urinary tract infection. How do I change the catheter? Supplies needed  Two pairs of sterile gloves.  Catheter.  Two syringes.  Sterile water.  Sterile cleaning solution.  Lubricant.  Collection bags. Changing the catheter To replace your catheter, take the following steps: 1. Drink plenty of fluids during the hours before you plan to change the catheter. 2. Wash your hands with soap and water. If soap and water are not available, use hand sanitizer. 3. Lie on your back and put on sterile gloves. 4. Clean the skin around the catheter opening using the sterile cleaning solution. 5. Remove the water from the balloon using a syringe. 6. Slowly remove the catheter. ? Do not pull on the catheter if it seems stuck. ? Call your health care provider immediately if you have difficulty  removing the catheter. 7. Take off the used gloves, and put on a new pair. 8. Put lubricant on the end of the new catheter that will go into your bladder. 9. Gently slide the catheter through the opening in your abdomen and into your bladder. 10. Wait for some urine to start flowing through the catheter. When urine starts to flow through the catheter, use a new syringe to fill the balloon with sterile water. 11. Attach the collection bag to the end of the catheter. Make sure the connection is tight. 12. Remove the gloves and wash your hands with soap and water.  How do I care for my skin around the catheter? Use a clean washcloth and soapy water to clean the skin around your catheter every day. Pat the area dry with a clean towel.  Do not pull on the catheter.  Do not use ointment or lotion on this area unless told by your health care provider.  Check your skin around the catheter every day for signs of infection. Check for: ? Redness, swelling, or pain. ? Fluid or blood. ? Warmth. ? Pus or a bad smell.  How do I clean and empty the collection bag? Clean the collection bag every 2-3 days, or as often as told by your health care provider. To do this, take the following steps:  Wash your hands with soap and water. If soap and water are not available, use hand sanitizer.  Disconnect the bag  from the catheter and immediately attach a new bag to the catheter.  Empty the used bag completely.  Clean the used bag using one of the following methods: ? Rinse the used bag with warm water and soap. ? Fill the bag with water and add 1 tsp of vinegar. Let it sit for about 30 minutes, then empty the bag.  Let the bag dry completely, and put it in a clean plastic bag before storing it.  Empty the large collection bag every 8 hours. Empty the small collection bag when it is about ? full. To empty your large or small collection bag, take the following steps:  Always keep the bag below the level  of the catheter. This keeps urine from flowing backwards into the catheter.  Hold the bag over the toilet or another container. Turn the valve (spigot) at the bottom of the bag to empty the urine. ? Do not touch the opening of the spigot. ? Do not let the opening touch the toilet or container.  Close the spigot tightly when the bag is empty.  What are some general tips?  Always wash your hands before and after caring for your catheter and collection bag. Use a mild, fragrance-free soap. If soap and water are not available, use hand sanitizer.  Clean the catheter with soap and water as often as told by your health care provider.  Always make sure there are no twists or curls (kinks) in the catheter tube.  Always make sure there are no leaks in the catheter or collection bag.  Drink enough fluid to keep your urine clear or pale yellow.  Do not take baths, swim, or use a hot tub. When should I seek medical care? Seek medical care if:  You leak urine.  You have redness, swelling, or pain around your catheter opening.  You have fluid or blood coming from your catheter opening.  Your catheter opening feels warm to the touch.  You have pus or a bad smell coming from your catheter opening.  You have a fever or chills.  Your urine flow slows down.  Your urine becomes cloudy or smelly.  When should I seek immediate medical care? Seek immediate medical care if your catheter comes out, or if you have:  Nausea.  Back pain.  Difficulty changing your catheter.  Blood in your urine.  No urine flow for 1 hour.  This information is not intended to replace advice given to you by your health care provider. Make sure you discuss any questions you have with your health care provider. Document Released: 01/06/2011 Document Revised: 12/18/2015 Document Reviewed: 01/01/2015 Elsevier Interactive Patient Education  2018 [employer].    General Anesthesia, Adult, Care After These  instructions provide you with information about caring for yourself after your procedure. Your health care provider may also give you more specific instructions. Your treatment has been planned according to current medical practices, but problems sometimes occur. Call your health care provider if you have any problems or questions after your procedure. What can I expect after the procedure? After the procedure, it is common to have:  Vomiting.  A sore throat.  Mental slowness.  It is common to feel:  Nauseous.  Cold or shivery.  Sleepy.  Tired.  Sore or achy, even in parts of your body where you did not have surgery.  Follow these instructions at home: For at least 24 hours after the procedure:  Do not: ? Participate in activities where you  could fall or become injured. ? Drive. ? Use heavy machinery. ? Drink alcohol. ? Take sleeping pills or medicines that cause drowsiness. ? Make important decisions or sign legal documents. ? Take care of children on your own.  Rest. Eating and drinking  If you vomit, drink water, juice, or soup when you can drink without vomiting.  Drink enough fluid to keep your urine clear or pale yellow.  Make sure you have little or no nausea before eating solid foods.  Follow the diet recommended by your health care provider. General instructions  Have a responsible adult stay with you until you are awake and alert.  Return to your normal activities as told by your health care provider. Ask your health care provider what activities are safe for you.  Take over-the-counter and prescription medicines only as told by your health care provider.  If you smoke, do not smoke without supervision.  Keep all follow-up visits as told by your health care provider. This is important. Contact a health care provider if:  You continue to have nausea or vomiting at home, and medicines are not helpful.  You cannot drink fluids or start eating  again.  You cannot urinate after 8-12 hours.  You develop a skin rash.  You have fever.  You have increasing redness at the site of your procedure. Get help right away if:  You have difficulty breathing.  You have chest pain.  You have unexpected bleeding.  You feel that you are having a life-threatening or urgent problem. This information is not intended to replace advice given to you by your health care provider. Make sure you discuss any questions you have with your health care provider. Document Released: 07/27/2000 Document Revised: 09/23/2015 Document Reviewed: 04/04/2015 Elsevier Interactive Patient Education  [employer]2018 [employer]

## 2017-06-23 NOTE — Anesthesia Procedure Notes (Signed)
Procedure Name: LMA Insertion Date/Time: 06/23/2017 1:32 PM Performed by: Yolonda Kidaarver, Burnette Sautter L, CRNA Pre-anesthesia Checklist: Patient identified, Emergency Drugs available, Suction available and Patient being monitored Patient Re-evaluated:Patient Re-evaluated prior to induction Oxygen Delivery Method: Circle system utilized Preoxygenation: Pre-oxygenation with 100% oxygen Induction Type: IV induction LMA: LMA inserted LMA Size: 4.0 Number of attempts: 1 Placement Confirmation: positive ETCO2,  CO2 detector and breath sounds checked- equal and bilateral Tube secured with: Tape Dental Injury: Teeth and Oropharynx as per pre-operative assessment

## 2017-06-23 NOTE — H&P (Signed)
Urology Admission H&P  Chief Complaint: neurogenic bladder urinary retention  History of Present Illness: [name] is a 56yo with a hx of CVA and neurogenic bladder managed with SP tube. Over 1 month ago his SP tube stopped draining and was removed. An indwelling foley was placed . H epresents today for SP tube placement  Past Medical History:  Diagnosis Date  . Anemia   . Chronic indwelling Foley catheter   . Depression   . Diabetes mellitus    Type II  . DVT (deep venous thrombosis) (HCC)   . GERD (gastroesophageal reflux disease)   . Hemiplegia (HCC)   . Hyperlipemia   . Hypertension   . Neurogenic bladder   . OCD (obsessive compulsive disorder)   . Pneumonia   . Seizures (HCC)    since a head injury in high school- no recent sezizures  . Stroke [facility](HCC)    deficit in right arm & right leg   Past Surgical History:  Procedure Laterality Date  . EYE SURGERY     cataract removal left eye  . MULTIPLE EXTRACTIONS WITH ALVEOLOPLASTY N/A 05/30/2013   Procedure: MULTIPLE EXTRACTION Teeth number Six, Eight, Nine, Ten, Eleven, Thirteen, Nineteen, and Twenty Eight  WITH ALVEOLOPLASTY;  Surgeon: Georgia LopesScott M Jensen, DDS;  Location: [facility] OR;  Service: Oral Surgery;  Laterality: N/A;  . MULTIPLE TOOTH EXTRACTIONS    . SUPRAPUBIC CATHETER INSERTION  2009  . TOOTH EXTRACTION N/A 10/16/2016   Procedure: DENTAL RESTORATION/EXTRACTIONS NUMBER FIVE, FOURTEEN, TWENTY-TWO, TWENTY- THREE, TWENTY-FOUR, TWENTY-FIVE, TWENTY-SIX, TWENTY-SEVEN, THIRTY-ONE, AND ALVEOLOPLASTY.;  Surgeon: Ocie DoyneJensen, Scott, DDS;  Location: [facility] OR;  Service: Oral Surgery;  Laterality: N/A;  . TRACHEOSTOMY  2009   also removal of     Home Medications:  Current Facility-Administered Medications  Medication Dose Route Frequency Provider Last Rate Last Dose  . ceFAZolin (ANCEF) IVPB 2g/100 mL premix  2 g Intravenous 30 min Pre-Op Tommaso Cavitt, Mardene CelestePatrick L, MD      . lactated ringers infusion   Intravenous Continuous Laurene FootmanGonzalez, Luis, MD 75 mL/hr  at 06/23/17 1226    . midazolam (VERSED) injection 1-2 mg  1-2 mg Intravenous Q5 min Laurene FootmanGonzalez, Luis, MD   2 mg at 06/23/17 1227   Allergies:  Allergies  Allergen Reactions  . No Known Allergies     History reviewed. No pertinent family history. Social History:  reports that he has quit smoking. he has never used smokeless tobacco. He reports that he does not drink alcohol or use drugs.  Review of Systems  All other systems reviewed and are negative.   Physical Exam:  Vital signs in last 24 hours: Temp:  [97.8 F (36.6 C)] 97.8 F (36.6 C) (02/20 1017) Pulse Rate:  [66] 66 (02/20 1017) BP: (138)/(75) 138/75 (02/20 1017) SpO2:  [97 %] 97 % (02/20 1017) Physical Exam  Constitutional: He is oriented to person, place, and time. He appears well-developed and well-nourished.  HENT:  Head: Normocephalic and atraumatic.  Eyes: EOM are normal. Pupils are equal, round, and reactive to light.  Neck: Normal range of motion. No thyromegaly present.  Cardiovascular: Normal rate.  Respiratory: Effort normal. No respiratory distress.  GI: Soft. He exhibits no distension.  Musculoskeletal: He exhibits no edema.  Neurological: He is alert and oriented to person, place, and time.  Skin: Skin is warm and dry.  Psychiatric: He has a normal mood and affect. His behavior is normal. Judgment and thought content normal.    Laboratory Data:  Results for orders placed  or performed during the hospital encounter of 06/23/17 (from the past 24 hour(s))  CBC     Status: Abnormal   Collection Time: 06/23/17 10:21 AM  Result Value Ref Range   WBC 7.1 4.0 - 10.5 K/uL   RBC 4.10 (L) 4.22 - 5.81 MIL/uL   Hemoglobin 11.1 (L) 13.0 - 17.0 g/dL   HCT 16.1 (L) 09.6 - 04.5 %   MCV 90.0 78.0 - 100.0 fL   MCH 27.1 26.0 - 34.0 pg   MCHC 30.1 30.0 - 36.0 g/dL   RDW 40.9 (H) 81.1 - 91.4 %   Platelets 187 150 - 400 K/uL  Comprehensive metabolic panel     Status: Abnormal   Collection Time: 06/23/17 10:21 AM   Result Value Ref Range   Sodium 140 135 - 145 mmol/L   Potassium 4.6 3.5 - 5.1 mmol/L   Chloride 100 (L) 101 - 111 mmol/L   CO2 30 22 - 32 mmol/L   Glucose, Bld 100 (H) 65 - 99 mg/dL   BUN 24 (H) 6 - 20 mg/dL   Creatinine, Ser 7.82 0.61 - 1.24 mg/dL   Calcium 9.0 8.9 - 95.6 mg/dL   Total Protein 6.6 6.5 - 8.1 g/dL   Albumin 2.8 (L) 3.5 - 5.0 g/dL   AST 12 (L) 15 - 41 U/L   ALT 10 (L) 17 - 63 U/L   Alkaline Phosphatase 97 38 - 126 U/L   Total Bilirubin 0.4 0.3 - 1.2 mg/dL   GFR calc non Af Amer >60 >60 mL/min   GFR calc Af Amer >60 >60 mL/min   Anion gap 10 5 - 15   No results found for this or any previous visit (from the past 240 hour(s)). Creatinine: Recent Labs    06/23/17 1021  CREATININE 0.91   Baseline Creatinine: 0.9  Impression/Assessment:  56yo with neurogenic bladder  Plan:   The risks/benefits/alternatives to SP tube placement was explained to the patient and he understands and wishes to proceed with surgery  Wilkie Aye 06/23/2017, 12:27 PM

## 2017-06-23 NOTE — Brief Op Note (Signed)
06/23/2017  2:50 PM  PATIENT:  [name]  57 y.o. male  PRE-OPERATIVE DIAGNOSIS:  NEUROGENIC BLADDER  POST-OPERATIVE DIAGNOSIS:  NEUROGENIC BLADDER  PROCEDURE:  Procedure(s): INSERTION OF SUPRAPUBIC CATHETER (N/A)  SURGEON:  Surgeon(s) and Role:    * Nathan Stallworth, Mardene CelestePatrick L, MD - Primary  PHYSICIAN ASSISTANT:   ASSISTANTS: none   ANESTHESIA:   general  EBL:  0 mL   BLOOD ADMINISTERED:none  DRAINS: Urinary Catheter (Foley) and Urinary Catheter (Suprapubic)   LOCAL MEDICATIONS USED:  NONE  SPECIMEN:  No Specimen  DISPOSITION OF SPECIMEN:  N/A  COUNTS:  YES  TOURNIQUET:  * No tourniquets in log *  DICTATION: .Note written in EPIC  PLAN OF CARE: Discharge to home after PACU  PATIENT DISPOSITION:  PACU - hemodynamically stable.   Delay start of Pharmacological VTE agent (>24hrs) due to surgical blood loss or risk of bleeding: not applicable

## 2017-06-23 NOTE — Transfer of Care (Signed)
Immediate Anesthesia Transfer of Care Note  Patient: Frank FullingSteven D Schneiderman  Procedure(s) Performed: INSERTION OF SUPRAPUBIC CATHETER (N/A )  Patient Location: PACU  Anesthesia Type:General  Level of Consciousness: awake, alert , oriented and patient cooperative  Airway & Oxygen Therapy: Patient Spontanous Breathing and Patient connected to nasal cannula oxygen  Post-op Assessment: Report given to RN and Post -op Vital signs reviewed and stable  Post vital signs: Reviewed and stable  Last Vitals:  Vitals:   06/23/17 1315 06/23/17 1423  BP: 109/65 (!) 165/82  Pulse:  67  Resp: 13 15  Temp:  36.9 C  SpO2: 97% 98%    Last Pain:  Vitals:   06/23/17 1017  TempSrc: Oral         Complications: No apparent anesthesia complications

## 2017-06-23 NOTE — Anesthesia Preprocedure Evaluation (Signed)
Anesthesia Evaluation  Patient identified by MRN, date of birth, ID band Patient confused    Reviewed: Allergy & Precautions, H&P , NPO status , Patient's Chart, lab work & pertinent test results  Airway Mallampati: II  TM Distance: >3 FB Neck ROM: full    Dental  (+) Edentulous Upper, Edentulous Lower   Pulmonary former smoker,    Pulmonary exam normal breath sounds clear to auscultation       Cardiovascular hypertension, Pt. on medications + DVT  Normal cardiovascular exam Rhythm:Regular Rate:Normal  ECG: NSR, rate 85   Neuro/Psych Seizures -, Well Controlled,  PSYCHIATRIC DISORDERS Anxiety Depression CVA, Residual Symptoms    GI/Hepatic GERD  Medicated,  Endo/Other  diabetes, Poorly Controlled, Type 2, Insulin Dependent, Oral Hypoglycemic Agents  Renal/GU      Musculoskeletal   Abdominal   Peds  Hematology  (+) anemia ,   Anesthesia Other Findings neurogenic bladder   Reproductive/Obstetrics                             Anesthesia Physical Anesthesia Plan  ASA: III  Anesthesia Plan: General   Post-op Pain Management:    Induction: Intravenous  PONV Risk Score and Plan:   Airway Management Planned: LMA  Additional Equipment:   Intra-op Plan:   Post-operative Plan: Extubation in OR  Informed Consent: I have reviewed the patients History and Physical, chart, labs and discussed the procedure including the risks, benefits and alternatives for the proposed anesthesia with the patient or authorized representative who has indicated his/her understanding and acceptance.     Plan Discussed with: CRNA, Anesthesiologist and Surgeon  Anesthesia Plan Comments:         Anesthesia Quick Evaluation

## 2017-06-25 ENCOUNTER — Encounter (HOSPITAL_COMMUNITY): Payer: Self-pay | Admitting: Urology

## 2017-06-28 NOTE — Anesthesia Postprocedure Evaluation (Signed)
Anesthesia Post Note  Patient: Frank FullingSteven D Barajas  Procedure(s) Performed: INSERTION OF SUPRAPUBIC CATHETER (N/A )  Patient location during evaluation: PACU Anesthesia Type: General Level of consciousness: awake and alert Pain management: satisfactory to patient Vital Signs Assessment: post-procedure vital signs reviewed and stable Respiratory status: spontaneous breathing Cardiovascular status: stable Postop Assessment: no apparent nausea or vomiting Anesthetic complications: no Comments: Late entry 06/28/2017 0845 T. Chemeka Filice CRNA     Last Vitals:  Vitals:   06/23/17 1500 06/23/17 1529  BP: (!) 179/93 140/79  Pulse: 69 72  Resp: 16 18  Temp:  36.8 C  SpO2: 98% 95%    Last Pain:  Vitals:   06/23/17 1603  TempSrc:   PainSc: 4                  Alaja Goldinger

## 2017-06-29 NOTE — Op Note (Signed)
Preoperative diagnosis: neurogenic bladder urinary retention  Postop diagnosis: Same  Procedure: 1. Cystoscopy 2. Suprapubic tube placement  Attending: Wilkie AyePatrick Mckenzie  Anesthesia: General  Estimated blood loss: 5 cc  Drains: 1. 18 French Foley catheter 2. 18 French SP tube  Specimens: none  Antibiotics: ancef  Findings: Severe bladder trabeculations. No masses/lesions in the bladder.  Indications: Patient is a 57 year old with a history of neurogenic bladder and urinary retention requiring chronic SP tube. His SP tube was removed due to failure and a new SP tube was unable to be placed. He currently has a chronic indwelling foley The patient wishes to proceed with SP tube placement..  After discussing treatment options patient decided to proceed with SP tube placement.  Procedure in detail: Prior to procedure consetn was obtained. Patient was brought to the operating room and briefing was done sure correct patient, correct procedure, correct site. General anesthesia was in administered patient was placed in the dorsal lithotomy position. The rigid 22 French cystoscope was passed urethra and bladder. Bladder was inspected masses or lesions and none were found. We then filled the bladder to approximately 500cc until it was easily palpable on the abdominal wall. We then made a 1cm incision 2cm above the pubic symphysis. Using the [facility] suprapubic tube introducer through the suprapubic incision we entered the bladder at the dome under direct vision. We removed the inner cannula and placed a 16 french foley through the sheath and into the bladder. We then filled the balloon with 10cc of water. The sheath was then removed and the SP tube was secured with a 0 silk to the skin. bladder was then drained and a 16 French Foley catheter was placed. This concluded the procedure which resulted by the patient.  Complications: None  Condition: Stable, extubated, transferred to PACU.  Plan:  Patient is to be discharged home. He is to followup in 1 week for foley catheter removal

## 2017-06-30 ENCOUNTER — Ambulatory Visit (INDEPENDENT_AMBULATORY_CARE_PROVIDER_SITE_OTHER): Payer: Medicare Other | Admitting: Urology

## 2017-06-30 DIAGNOSIS — N319 Neuromuscular dysfunction of bladder, unspecified: Secondary | ICD-10-CM

## 2017-08-04 ENCOUNTER — Ambulatory Visit (INDEPENDENT_AMBULATORY_CARE_PROVIDER_SITE_OTHER): Payer: Medicare Other | Admitting: Urology

## 2017-08-04 DIAGNOSIS — N310 Uninhibited neuropathic bladder, not elsewhere classified: Secondary | ICD-10-CM

## 2017-11-03 ENCOUNTER — Ambulatory Visit (INDEPENDENT_AMBULATORY_CARE_PROVIDER_SITE_OTHER): Payer: Medicare Other | Admitting: Urology

## 2017-11-03 DIAGNOSIS — N310 Uninhibited neuropathic bladder, not elsewhere classified: Secondary | ICD-10-CM | POA: Diagnosis not present

## 2017-12-18 ENCOUNTER — Emergency Department (HOSPITAL_COMMUNITY): Payer: Medicare Other

## 2017-12-18 ENCOUNTER — Emergency Department (HOSPITAL_COMMUNITY)
Admission: EM | Admit: 2017-12-18 | Discharge: 2017-12-18 | Disposition: A | Discharge status: Home / Self Care | Payer: Medicare Other | Attending: Emergency Medicine | Admitting: Emergency Medicine

## 2017-12-18 ENCOUNTER — Encounter (HOSPITAL_COMMUNITY): Payer: Self-pay

## 2017-12-18 DIAGNOSIS — Y732 Prosthetic and other implants, materials and accessory gastroenterology and urology devices associated with adverse incidents: Secondary | ICD-10-CM | POA: Insufficient documentation

## 2017-12-18 DIAGNOSIS — R319 Hematuria, unspecified: Secondary | ICD-10-CM

## 2017-12-18 DIAGNOSIS — Z87891 Personal history of nicotine dependence: Secondary | ICD-10-CM | POA: Diagnosis not present

## 2017-12-18 DIAGNOSIS — T83010A Breakdown (mechanical) of cystostomy catheter, initial encounter: Secondary | ICD-10-CM

## 2017-12-18 DIAGNOSIS — E119 Type 2 diabetes mellitus without complications: Secondary | ICD-10-CM | POA: Insufficient documentation

## 2017-12-18 DIAGNOSIS — Z79899 Other long term (current) drug therapy: Secondary | ICD-10-CM | POA: Insufficient documentation

## 2017-12-18 DIAGNOSIS — Z436 Encounter for attention to other artificial openings of urinary tract: Secondary | ICD-10-CM | POA: Diagnosis present

## 2017-12-18 DIAGNOSIS — I10 Essential (primary) hypertension: Secondary | ICD-10-CM | POA: Diagnosis not present

## 2017-12-18 DIAGNOSIS — Z794 Long term (current) use of insulin: Secondary | ICD-10-CM | POA: Diagnosis not present

## 2017-12-18 LAB — BASIC METABOLIC PANEL
Anion gap: 7 (ref 5–15)
BUN: 23 mg/dL — AB (ref 6–20)
CHLORIDE: 98 mmol/L (ref 98–111)
CO2: 31 mmol/L (ref 22–32)
Calcium: 8.7 mg/dL — ABNORMAL LOW (ref 8.9–10.3)
Creatinine, Ser: 0.87 mg/dL (ref 0.61–1.24)
GFR calc Af Amer: >60 mL/min (ref >60)
GFR calc non Af Amer: >60 mL/min (ref >60)
Glucose, Bld: 117 mg/dL — ABNORMAL HIGH (ref 70–99)
POTASSIUM: 4 mmol/L (ref 3.5–5.1)
SODIUM: 136 mmol/L (ref 135–145)

## 2017-12-18 LAB — URINALYSIS, ROUTINE W REFLEX MICROSCOPIC
BACTERIA UA: NONE SEEN
Bilirubin Urine: NEGATIVE
Glucose, UA: NEGATIVE mg/dL
Ketones, ur: 5 mg/dL — AB
Nitrite: NEGATIVE
PH: 8 (ref 5.0–8.0)
Protein, ur: 100 mg/dL — AB
RBC / HPF: >50 RBC/hpf — ABNORMAL HIGH (ref 0–5)
SPECIFIC GRAVITY, URINE: 1.009 (ref 1.005–1.030)
WBC, UA: >50 WBC/hpf — ABNORMAL HIGH (ref 0–5)

## 2017-12-18 LAB — CBC WITH DIFFERENTIAL/PLATELET
Basophils Absolute: 0 10*3/uL (ref 0.0–0.1)
Basophils Relative: 0 %
EOS ABS: 0 10*3/uL (ref 0.0–0.7)
Eosinophils Relative: 0 %
HEMATOCRIT: 33.8 % — AB (ref 39.0–52.0)
HEMOGLOBIN: 10.7 g/dL — AB (ref 13.0–17.0)
LYMPHS ABS: 1.9 10*3/uL (ref 0.7–4.0)
LYMPHS PCT: 24 %
MCH: 28.7 pg (ref 26.0–34.0)
MCHC: 31.7 g/dL (ref 30.0–36.0)
MCV: 90.6 fL (ref 78.0–100.0)
Monocytes Absolute: 1 10*3/uL (ref 0.1–1.0)
Monocytes Relative: 13 %
NEUTROS ABS: 5.1 10*3/uL (ref 1.7–7.7)
NEUTROS PCT: 63 %
Platelets: 123 10*3/uL — ABNORMAL LOW (ref 150–400)
RBC: 3.73 MIL/uL — AB (ref 4.22–5.81)
RDW: 15.8 % — ABNORMAL HIGH (ref 11.5–15.5)
WBC: 8 10*3/uL (ref 4.0–10.5)

## 2017-12-18 MED ORDER — CEPHALEXIN 500 MG PO CAPS
500.0000 mg | ORAL_CAPSULE | Freq: Once | ORAL | Status: AC
  Administered 2017-12-18: 500 mg via ORAL
  Filled 2017-12-18: qty 1

## 2017-12-18 MED ORDER — CEPHALEXIN 500 MG PO CAPS: 500.0000 mg | ORAL_CAPSULE | Freq: Four times a day (QID) | ORAL | 0 refills | Status: DC

## 2017-12-18 MED ORDER — FLUCONAZOLE 150 MG PO TABS
150.0000 mg | ORAL_TABLET | Freq: Once | ORAL | Status: AC
  Administered 2017-12-18: 150 mg via ORAL
  Filled 2017-12-18: qty 1

## 2017-12-18 NOTE — ED Notes (Signed)
[facility] called [facility]

## 2017-12-18 NOTE — Discharge Instructions (Addendum)
The blood in the urine is from the catheter going all the way through the bladder and into the base of the penis.  This should resolve.  The catheter was replaced and irrigated extensively with clots removed.  Take antibiotics for urinary tract infection.  Follow-up with the urologist.  Return to the ED with fever, pain, inability to urinate or any other concerns.

## 2017-12-18 NOTE — ED Triage Notes (Signed)
[facility], [facility] via [facility] for problems with suprapubic cath.   Per staff, pt's catheter was manipulated due to no urine drainage in bag.  Shortly after the manipulation, the patient started passing blood into the bag as well as bleeding from the penis.  Pt has history of neurogenic bladder.

## 2017-12-18 NOTE — ED Notes (Signed)
Catheter supplies located in patient's room.

## 2017-12-18 NOTE — ED Provider Notes (Signed)
[facility] EMERGENCY DEPARTMENT Provider Note   CSN: [id number] Arrival date & time: 12/18/17  0045     History   Chief Complaint Chief Complaint  Patient presents with  . Suprapubic Cath bleeding    HPI [name] is a 57 y.o. male.  Level 5 caveat for communication barrier from previous stroke.  Patient from nursing facility with bleeding in his suprapubic catheter.  Wife states he has had this for many years.  It was changed at the nursing facility today.  They apparently did not get any urine back manipulation of the catheter now is having some gross hematuria in the suprapubic catheter bag as well as leaking from around the catheter site.  There is also some bleeding coming from patient's urethra.  He denies any pain, fever, chills, nausea or vomiting.  He does not take any blood thinners other than aspirin.  Denies any dizziness or lightheadedness.  The history is provided by the patient and the EMS personnel. The history is limited by the condition of the patient.    Past Medical History:  Diagnosis Date  . Anemia   . Chronic indwelling Foley catheter   . Depression   . Diabetes mellitus    Type II  . DVT (deep venous thrombosis) (HCC)   . GERD (gastroesophageal reflux disease)   . Hemiplegia (HCC)   . Hyperlipemia   . Hypertension   . Neurogenic bladder   . OCD (obsessive compulsive disorder)   . Pneumonia   . Seizures (HCC)    since a head injury in high school- no recent sezizures  . Stroke [facility](HCC)    deficit in right arm & right leg    Patient Active Problem List   Diagnosis Date Noted  . Type II or unspecified type diabetes mellitus without mention of complication, not stated as uncontrolled 11/01/2012  . HTN (hypertension) 11/01/2012  . Other and unspecified hyperlipidemia 11/01/2012  . Convulsions/seizures (HCC) 11/01/2012  . CVA (cerebral infarction) 11/01/2012  . Depressive disorder, not elsewhere classified 11/01/2012  . DVT (deep venous  thrombosis) (HCC) 11/01/2012    Past Surgical History:  Procedure Laterality Date  . EYE SURGERY     cataract removal left eye  . INSERTION OF SUPRAPUBIC CATHETER N/A 06/23/2017   Procedure: INSERTION OF SUPRAPUBIC CATHETER;  Surgeon: Malen GauzeMcKenzie, Patrick L, MD;  Location: [facility] OR[facility];  Service: Urology;  Laterality: N/A;  . MULTIPLE EXTRACTIONS WITH ALVEOLOPLASTY N/A 05/30/2013   Procedure: MULTIPLE EXTRACTION Teeth number Six, Eight, Nine, Ten, Eleven, Thirteen, Nineteen, and Twenty Eight  WITH ALVEOLOPLASTY;  Surgeon: Georgia LopesScott M Jensen, DDS;  Location: [facility] OR;  Service: Oral Surgery;  Laterality: N/A;  . MULTIPLE TOOTH EXTRACTIONS    . SUPRAPUBIC CATHETER INSERTION  2009  . TOOTH EXTRACTION N/A 10/16/2016   Procedure: DENTAL RESTORATION/EXTRACTIONS NUMBER FIVE, FOURTEEN, TWENTY-TWO, TWENTY- THREE, TWENTY-FOUR, TWENTY-FIVE, TWENTY-SIX, TWENTY-SEVEN, THIRTY-ONE, AND ALVEOLOPLASTY.;  Surgeon: Ocie DoyneJensen, Scott, DDS;  Location: [facility] OR;  Service: Oral Surgery;  Laterality: N/A;  . TRACHEOSTOMY  2009   also removal of         Home Medications    Prior to Admission medications   Medication Sig Start Date End Date Taking? Authorizing Provider  acetaminophen (TYLENOL) 500 MG tablet Take 500 mg by mouth every 8 (eight) hours as needed for moderate pain.     [provider]  Amino Acids-Protein Hydrolys (FEEDING SUPPLEMENT, PRO-STAT SUGAR FREE 64,) LIQD Take 30 mLs by mouth every morning.    [provider]  aspirin 81 MG chewable tablet Chew 81 mg by mouth every morning.     [provider]  baclofen (LIORESAL) 10 MG tablet Take 15 mg by mouth 3 (three) times daily.      [provider]  barrier cream (NON-SPECIFIED) CREA Apply 1 application topically as needed (APPLY AFTER EACH CHANGING).    [provider]  bisacodyl (DULCOLAX) 5 MG EC tablet Take 10 mg by mouth daily as needed (FOR CONSTIPATION).     [provider]  brimonidine (ALPHAGAN) 0.2 %  ophthalmic solution Place 1 drop into both eyes 2 (two) times daily.    [provider]  divalproex (DEPAKOTE) 500 MG DR tablet Take 500 mg by mouth 4 (four) times daily.     [provider]  escitalopram (LEXAPRO) 20 MG tablet Take 20 mg by mouth every morning.    [provider]  ezetimibe (ZETIA) 10 MG tablet Take 10 mg by mouth every morning.    [provider]  famotidine (PEPCID) 20 MG tablet Take 20 mg by mouth every morning.     [provider]  Fe-Succ-C-Thre-B12-Des Stomach (MULTIGEN PO) Take 1 tablet by mouth every morning.    [provider]  ferrous sulfate 325 (65 FE) MG tablet Take 325 mg by mouth daily with breakfast.    [provider]  guaiFENesin-dextromethorphan (ROBITUSSIN DM) 100-10 MG/5ML syrup Take 10 mLs by mouth every 4 (four) hours as needed for cough.    [provider]  insulin glargine (LANTUS) 100 UNIT/ML injection Inject 95 Units into the skin every morning.     [provider]  insulin lispro (HUMALOG) 100 UNIT/ML injection Inject 12 Units into the skin 3 (three) times daily before meals.    [provider]  lisinopril (PRINIVIL,ZESTRIL) 2.5 MG tablet Take 2.5 mg by mouth every morning.    [provider]  Menthol, Topical Analgesic, (BIOFREEZE EX) Apply 1 application topically 2 (two) times daily as needed (FOR HIP/LEG PAIN). 5% TOPICAL SOLUTION    [provider]  metFORMIN (GLUCOPHAGE) 1000 MG tablet Take 1,000-1,500 mg by mouth 2 (two) times daily. 1000 MG IN THE EVENING & 1500 MG IN THE MORNING (1000 &1700)    [provider]  Multiple Vitamins-Minerals (CERTAGEN) tablet Take 1 tablet by mouth daily.    [provider]  omega-3 acid ethyl esters (LOVAZA) 1 G capsule Take 2 g by mouth 2 (two) times daily. (1000&2200)    [provider]  ondansetron (ZOFRAN-ODT) 4 MG disintegrating tablet Take 4 mg by mouth every 4 (four) hours as  needed for nausea or vomiting.    [provider]  oxyCODONE 10 MG TABS Take 1 tablet (10 mg total) by mouth every 4 (four) hours as needed for severe pain or breakthrough pain. 06/23/17   McKenzie, Mardene Celeste, MD  polyvinyl alcohol (LIQUIFILM TEARS) 1.4 % ophthalmic solution Place 1 drop into both eyes 2 (two) times daily.    [provider]  pregabalin (LYRICA) 100 MG capsule Take 100 mg by mouth 3 (three) times daily.     [provider]  rosuvastatin (CRESTOR) 20 MG tablet Take 20 mg by mouth daily.    [provider]  senna-docusate (SENOKOT-S) 8.6-50 MG tablet Take 2 tablets by mouth daily at 10 pm.    [provider]  solifenacin (VESICARE) 10 MG tablet Take 10 mg by mouth every morning.    [provider]  vitamin C (ASCORBIC ACID) 500  MG tablet Take 500 mg by mouth 2 (two) times daily.    [provider]  Vitamin D, Ergocalciferol, (DRISDOL) 50000 units CAPS capsule Take 50,000 Units by mouth every 30 (thirty) days.    [provider]    Family History No family history on file.  Social History Social History   Tobacco Use  . Smoking status: Former Games developer  . Smokeless tobacco: Never Used  Substance Use Topics  . Alcohol use: No  . Drug use: No     Allergies   No known allergies   Review of Systems Review of Systems  Constitutional: Negative for activity change, appetite change and fever.  HENT: Negative for congestion.   Respiratory: Negative for cough, chest tightness and shortness of breath.   Cardiovascular: Negative for chest pain.  Gastrointestinal: Negative for abdominal pain and nausea.  Genitourinary: Positive for difficulty urinating, dysuria, frequency and hematuria. Negative for decreased urine volume, flank pain, penile swelling, scrotal swelling and testicular pain.  Musculoskeletal: Negative for arthralgias and myalgias.  Skin: Negative for rash.  Neurological: Negative for dizziness,  weakness and headaches.     all other systems are negative except as noted in the HPI and PMH.     Physical Exam Updated Vital Signs BP 132/65 (BP Location: Right Arm)   Pulse 94   Temp 99.1 F (37.3 C) (Oral)   Resp 16   SpO2 97%   Physical Exam  Constitutional: He appears well-developed and well-nourished. No distress.  Appears pale  HENT:  Head: Normocephalic and atraumatic.  Mouth/Throat: Oropharynx is clear and moist. No oropharyngeal exudate.  Eyes: Pupils are equal, round, and reactive to light. Conjunctivae and EOM are normal.  Neck: Normal range of motion. Neck supple.  No meningismus.  Cardiovascular: Normal rate, regular rhythm, normal heart sounds and intact distal pulses.  No murmur heard. Pulmonary/Chest: Effort normal and breath sounds normal. No respiratory distress.  Abdominal: Soft. There is no tenderness. There is no rebound and no guarding.  Suprapubic catheter in place with leaking around the catheter site.  There is blood in patient's diaper which appears to be coming from his urethra  Genitourinary:  Genitourinary Comments: Hypospadias present No testicular pain or tenderness  Musculoskeletal: Normal range of motion. He exhibits no edema or tenderness.  Neurological: He is alert. No cranial nerve deficit. He exhibits normal muscle tone. Coordination normal.  Right-sided weakness at baseline  Skin: Skin is warm.  Psychiatric: He has a normal mood and affect. His behavior is normal.  Nursing note and vitals reviewed.    ED Treatments / Results  Labs (all labs ordered are listed, but only abnormal results are displayed) Labs Reviewed  URINALYSIS, ROUTINE W REFLEX MICROSCOPIC - Abnormal; Notable for the following components:      Result Value   APPearance CLOUDY (*)    Hgb urine dipstick LARGE (*)    Ketones, ur 5 (*)    Protein, ur 100 (*)    Leukocytes, UA LARGE (*)    RBC / HPF >50 (*)    WBC, UA >50 (*)    All other components within  normal limits  CBC WITH DIFFERENTIAL/PLATELET - Abnormal; Notable for the following components:   RBC 3.73 (*)    Hemoglobin 10.7 (*)    HCT 33.8 (*)    RDW 15.8 (*)    Platelets 123 (*)    All other components within normal limits  BASIC METABOLIC PANEL - Abnormal; Notable for the following components:  Glucose, Bld 117 (*)    BUN 23 (*)    Calcium 8.7 (*)    All other components within normal limits  URINE CULTURE    EKG None  Radiology Ct Renal Stone Study  Result Date: 12/18/2017 CLINICAL DATA:  Patient's catheter was manipulated with no urine in the bag period after manipulation, patient started passing blood and bleeding from the penis. History of neurogenic bladder. EXAM: CT ABDOMEN AND PELVIS WITHOUT CONTRAST TECHNIQUE: Multidetector CT imaging of the abdomen and pelvis was performed following the standard protocol without IV contrast. COMPARISON:  None. FINDINGS: Lower chest: Atelectasis in the lung bases. Coronary artery calcifications. Hepatobiliary: No focal liver abnormality is seen. No gallstones, gallbladder wall thickening, or biliary dilatation. Pancreas: Unremarkable. No pancreatic ductal dilatation or surrounding inflammatory changes. Spleen: Normal in size without focal abnormality. Adrenals/Urinary Tract: No adrenal gland nodules. Kidneys are symmetrical in size. No renal or ureteral stones are seen. There is hydronephrosis and hydroureter on the right with stranding around the right kidney and ureter and with right ureteral wall thickening. In the absence of obvious stones, this could represent obstruction due to an occult non radiopaque stone, reflux with pyelonephritis, or distal stricture. Left kidney and ureter are unremarkable. There is a suprapubic catheter passing through the bladder with the balloon demonstrated in the posterior urethra distal to the prostate gland. The bladder is incompletely distended. Urine within the bladder is heterogeneous and hyperdense,  likely representing hemorrhage. Stomach/Bowel: Stomach, small bowel, and colon are not abnormally distended. Stool diffusely throughout the colon. Prominent stool in the rectum without significant rectal wall thickening. No inflammatory changes are appreciated. Appendix is normal. Vascular/Lymphatic: Aortic atherosclerosis. No enlarged abdominal or pelvic lymph nodes. Reproductive: Prostate is unremarkable. Calcification in the seminal vesicles. Other: No free air or free fluid in the abdomen. Musculoskeletal: Degenerative changes in the spine. No destructive bone lesions. IMPRESSION: 1. Suprapubic catheter extends through the bladder and urethra with the balloon in the posterior urethra distal to the prostate gland. 2. Bladder is incompletely distended. Urine within the bladder is heterogeneous and hyperdense, likely representing hemorrhage. 3. Right hydronephrosis and hydroureter with stranding around the right kidney and ureter. In the absence of obvious stones, this could represent obstruction due to an occult non radiopaque stone, reflux with pyelonephritis, or distal stricture. 4. Aortic atherosclerosis. Electronically Signed   By: Burman Nieves M.D.   On: 12/18/2017 02:23    Procedures SUPRAPUBIC TUBE PLACEMENT Date/Time: 12/18/2017 2:07 AM Performed by: Glynn Octave, MD Authorized by: Glynn Octave, MD   Consent:    Consent obtained:  Verbal   Consent given by:  Patient   Risks discussed:  Bleeding, infection and pain   Alternatives discussed:  Delayed treatment and no treatment Anesthesia (see MAR for exact dosages):    Anesthesia method:  None Procedure details:    Complexity:  Simple   Catheter type:  Foley   Catheter size:  16 Fr   Ultrasound guidance: no     Number of attempts:  1   Urine characteristics:  Bloody Post-procedure details:    Patient tolerance of procedure:  Tolerated well, no immediate complications   (including critical care time)  Medications Ordered  in ED Medications - No data to display   Initial Impression / Assessment and Plan / ED Course  I have reviewed the triage vital signs and the nursing notes.  Pertinent labs & imaging results that were available during my care of the patient were reviewed by me  and considered in my medical decision making (see chart for details).    Patient from nursing facility with bleeding from suprapubic catheter and no drainage in bag.  Catheter was attempted to be irrigated on arrival but was not able to be flushed. Gross blood around catheter and from penis.  Catheter replaced.  Drainage is still poor. There is some bleeding from around catheter site as well as from patient's urethra.  Concerned that catheter is clotting again.  CT scan obtained and shows catheter is placed in too far and going to prosthetic urethra. Balloon was deflated and catheter was withdrawn.  And balloon blown up again in appropriate position.  Case discussed with Dr. Berneice HeinrichManny of urology.  He agrees with above treatment.  States to continue irrigation of catheter.  Expect to have some bleeding from penis as well as catheter site due to trauma.   Suspect catheter was misplaced at nursing facility. Does not recommend changing size of catheter.  Dr. Berneice HeinrichManny  feels that patient's hydronephrosis looks to be chronic.  States bleeding should improve with irrigation but if it is not manageable [facility] needs [facility]  Foley catheter repositioned after discussion with Dr. Unknown FoleyManney.  Catheter balloon appears to be normal position and ultrasound.  Extensive bladder irrigation performed with clearing of clots and hematuria.  Labs show stable creatinine.  Will treat for UTI. Culture sent.  Antifungal dose given as well.  Appears stable to return to facility with antibiotics and urology followup. Return precautions discussed.  CRITICAL CARE Performed by: Glynn OctaveANCOUR, Jodey Burbano Total critical care time:  35minutes Critical care time was exclusive of separately billable procedures and treating other patients. Critical care was necessary to treat or prevent imminent or life-threatening deterioration. Critical care was time spent personally by me on the following activities: development of treatment plan with patient and/or surrogate as well as nursing, discussions with consultants, evaluation of patient's response to treatment, examination of patient, obtaining history from patient or surrogate, ordering and performing treatments and interventions, ordering and review of laboratory studies, ordering and review of radiographic studies, pulse oximetry and re-evaluation of patient's condition.  Final Clinical Impressions(s) / ED Diagnoses   Final diagnoses:  Suprapubic catheter dysfunction, initial encounter (HCC)  Hematuria, unspecified type    ED Discharge Orders    None       Jatavia Keltner, Jeannett SeniorStephen, MD 12/18/17 [phone]

## 2017-12-18 NOTE — ED Notes (Signed)
Irrigation complete for suprapubic catheter. Numerous clots clear and catheter cleared. Tubing and drianage bag changed. Patient given a bed bath with linen change and gown change. 4 x 4 dressing placed near umbilicus for drainage.

## 2017-12-18 NOTE — ED Notes (Signed)
I called c-com to ask if an ambulance is enroute to ED for transport and dispatch stated that they are waiting until after 1000 am when [facility] to [facility] truck for transport. Wife at bedside and made aware of transport time should be around 1030 per c-com.

## 2017-12-18 NOTE — ED Notes (Signed)
Superpubic cath removed and new suprapubic cath replaced by Dr Lia Hoppingancuor.

## 2017-12-18 NOTE — ED Notes (Signed)
[facility] at this time.

## 2017-12-18 NOTE — ED Triage Notes (Signed)
Wife reports she was told the patient also has some bleeding from the underside of his scrotum.

## 2017-12-20 LAB — URINE CULTURE: Culture: 50000 — AB

## 2017-12-21 ENCOUNTER — Telehealth: Payer: Self-pay | Admitting: Emergency Medicine

## 2017-12-21 NOTE — Telephone Encounter (Signed)
Post ED Visit - Positive Culture Follow-up: Successful Patient Follow-Up  Culture assessed and recommendations reviewed by:  []  Enzo BiNathan Batchelder, Pharm.D. []  Celedonio MiyamotoJeremy Frens, [address]Ph[address] BCPS AQ-ID []  Garvin FilaMike Maccia, Pharm.D., BCPS []  Georgina PillionElizabeth Martin, [address]Ph[address]D., BCPS []  [place]ada[place]h Pham, [place]arm[place]D., BCPS, AAHIVP []  Estella HuskMichelle Turner, Pharm.D., BCPS, AAHIVP [x]  Lysle Pearlachel Rumbarger, PharmD, BCPS []  Phillips Climeshuy Dang, PharmD, BCPS []  Agapito GamesAlison Masters, PharmD, BCPS []  Verlan FriendsErin Deja, PharmD  Positive urine culture  []  Patient discharged without antimicrobial prescription and treatment is now indicated [x]  Organism is resistant to prescribed ED discharge antimicrobial []  Patient with positive blood cultures  Changes discussed with ED provider: Frederik PearMia McDonald PA New antibiotic prescription symptom check and repeat ua at facility, d/c keflex, if symptomatic start bactrim DS 1 po bid x 14 days  [facility] @ [phone]   Berle MullMiller, Aston Lieske 12/21/2017, 1:02 PM

## 2017-12-21 NOTE — Progress Notes (Signed)
ED Antimicrobial Stewardship Positive Culture Follow Up   Frank FullingSteven D [name] is an 57 y.o. male who [facility] with a chief complaint of  Chief Complaint  Patient presents with  . Suprapubic Cath bleeding    Recent Results (from the past 720 hour(s))  Urine Culture     Status: Abnormal   Collection Time: 12/18/17  1:12 AM  Result Value Ref Range Status   Specimen Description   Final    URINE, CLEAN CATCH Performed at [facility], [address] [place], [place] [address]    Special Requests   Final    NONE Performed at [facility], [address] [place], [place] [address]    Culture (A)  Final    50,000 COLONIES/mL METHICILLIN RESISTANT STAPHYLOCOCCUS AUREUS   Report Status 12/20/2017 FINAL  Final   Organism ID, Bacteria METHICILLIN RESISTANT STAPHYLOCOCCUS AUREUS (A)  Final      Susceptibility   Methicillin resistant staphylococcus aureus - MIC*    CIPROFLOXACIN >=8 RESISTANT Resistant     GENTAMICIN <=0.5 SENSITIVE Sensitive     NITROFURANTOIN 32 SENSITIVE Sensitive     OXACILLIN >=4 RESISTANT Resistant     TETRACYCLINE <=1 SENSITIVE Sensitive     VANCOMYCIN <=0.5 SENSITIVE Sensitive     TRIMETH/SULFA <=10 SENSITIVE Sensitive     CLINDAMYCIN >=8 RESISTANT Resistant     RIFAMPIN <=0.5 SENSITIVE Sensitive     Inducible Clindamycin NEGATIVE Sensitive     * 50,000 COLONIES/mL METHICILLIN RESISTANT STAPHYLOCOCCUS AUREUS    [x]  Treated with cephalexin, organism resistant to prescribed antimicrobial []  Patient discharged originally without antimicrobial agent and treatment is now indicated  New antibiotic prescription: DC cephalexin - IF symptomatic, start bactrim DS 1 tablet PO BID x 14 days. Consider rechecking UA at SNF  ED Provider: Frederik PearMia McDonald, PA    Alven Alverio, Drake LeachRachel Lynn 12/21/2017, 9:13 AM Clinical Pharmacist Monday - Friday phone -  [phone] Saturday - Sunday phone - [phone]

## 2018-01-05 ENCOUNTER — Ambulatory Visit (INDEPENDENT_AMBULATORY_CARE_PROVIDER_SITE_OTHER): Payer: Medicare Other | Admitting: Urology

## 2018-01-05 DIAGNOSIS — N310 Uninhibited neuropathic bladder, not elsewhere classified: Secondary | ICD-10-CM

## 2018-05-20 ENCOUNTER — Other Ambulatory Visit: Payer: Self-pay

## 2018-05-20 ENCOUNTER — Emergency Department (HOSPITAL_COMMUNITY)
Admission: EM | Admit: 2018-05-20 | Discharge: 2018-05-20 | Disposition: A | Discharge status: Home / Self Care | Payer: Medicare Other | Attending: Emergency Medicine | Admitting: Emergency Medicine

## 2018-05-20 ENCOUNTER — Encounter (HOSPITAL_COMMUNITY): Payer: Self-pay

## 2018-05-20 ENCOUNTER — Emergency Department (HOSPITAL_COMMUNITY): Payer: Medicare Other

## 2018-05-20 DIAGNOSIS — E119 Type 2 diabetes mellitus without complications: Secondary | ICD-10-CM | POA: Diagnosis not present

## 2018-05-20 DIAGNOSIS — Y92129 Unspecified place in nursing home as the place of occurrence of the external cause: Secondary | ICD-10-CM | POA: Insufficient documentation

## 2018-05-20 DIAGNOSIS — Z87891 Personal history of nicotine dependence: Secondary | ICD-10-CM | POA: Insufficient documentation

## 2018-05-20 DIAGNOSIS — I10 Essential (primary) hypertension: Secondary | ICD-10-CM | POA: Insufficient documentation

## 2018-05-20 DIAGNOSIS — Y939 Activity, unspecified: Secondary | ICD-10-CM | POA: Insufficient documentation

## 2018-05-20 DIAGNOSIS — W06XXXA Fall from bed, initial encounter: Secondary | ICD-10-CM | POA: Diagnosis not present

## 2018-05-20 DIAGNOSIS — S5002XA Contusion of left elbow, initial encounter: Secondary | ICD-10-CM | POA: Insufficient documentation

## 2018-05-20 DIAGNOSIS — S0990XA Unspecified injury of head, initial encounter: Secondary | ICD-10-CM | POA: Diagnosis present

## 2018-05-20 DIAGNOSIS — Y999 Unspecified external cause status: Secondary | ICD-10-CM | POA: Diagnosis not present

## 2018-05-20 DIAGNOSIS — S0001XA Abrasion of scalp, initial encounter: Secondary | ICD-10-CM | POA: Insufficient documentation

## 2018-05-20 DIAGNOSIS — Z794 Long term (current) use of insulin: Secondary | ICD-10-CM | POA: Diagnosis not present

## 2018-05-20 DIAGNOSIS — W19XXXA Unspecified fall, initial encounter: Secondary | ICD-10-CM

## 2018-05-20 DIAGNOSIS — Z79899 Other long term (current) drug therapy: Secondary | ICD-10-CM | POA: Insufficient documentation

## 2018-05-20 NOTE — Discharge Instructions (Addendum)
[name] his head CT does not show any new findings.  Have him rechecked for any problems listed on the head injury sheet.  He can have Tylenol if needed.  Keep the abrasion on his left elbow clean and dry.

## 2018-05-20 NOTE — ED Triage Notes (Signed)
Per the staff of [facility], the pt was witnessed "throwing himself off of the bed onto the floor and hitting the back of his head on the floor.  Pt has a very small skin avulsion to the top of his head.  No other complaints.

## 2018-05-20 NOTE — ED Notes (Signed)
Patient transported to CT 

## 2018-05-20 NOTE — ED Notes (Signed)
Pt's wife signed disposition for discharge instructions.  Calling [facility] to give report

## 2018-05-20 NOTE — ED Provider Notes (Signed)
[facility] EMERGENCY DEPARTMENT Provider Note   CSN: [id number] Arrival date & time: 05/20/18  0418  Time seen 5:55 AM   History   Chief Complaint Chief Complaint  Patient presents with  . Possible head injury   Level 5 caveat for dysarthria  HPI Erin FullingSteven D Villari is a 58 y.o. male.  HPI patient was brought to the emergency department via EMS tonight.  Per his nursing facility they state he "threw himself off the bed onto the floor and hit the back of his head".  Wife is here at bedside and states patient did not know why he was here because he felt fine.  She states he did not think he had fallen.  She has noted some bruising to his left elbow which he is not complaining of.  PCP System, Pcp Not In   Past Medical History:  Diagnosis Date  . Anemia   . Chronic indwelling Foley catheter   . Depression   . Diabetes mellitus    Type II  . DVT (deep venous thrombosis) (HCC)   . GERD (gastroesophageal reflux disease)   . Hemiplegia (HCC)   . Hyperlipemia   . Hypertension   . Neurogenic bladder   . OCD (obsessive compulsive disorder)   . Pneumonia   . Seizures (HCC)    since a head injury in high school- no recent sezizures  . Stroke [facility](HCC)    deficit in right arm & right leg    Patient Active Problem List   Diagnosis Date Noted  . Type II or unspecified type diabetes mellitus without mention of complication, not stated as uncontrolled 11/01/2012  . HTN (hypertension) 11/01/2012  . Other and unspecified hyperlipidemia 11/01/2012  . Convulsions/seizures (HCC) 11/01/2012  . CVA (cerebral infarction) 11/01/2012  . Depressive disorder, not elsewhere classified 11/01/2012  . DVT (deep venous thrombosis) (HCC) 11/01/2012    Past Surgical History:  Procedure Laterality Date  . EYE SURGERY     cataract removal left eye  . INSERTION OF SUPRAPUBIC CATHETER N/A 06/23/2017   Procedure: INSERTION OF SUPRAPUBIC CATHETER;  Surgeon: Malen GauzeMcKenzie, Patrick L, MD;  Location: [facility];   Service: Urology;  Laterality: N/A;  . MULTIPLE EXTRACTIONS WITH ALVEOLOPLASTY N/A 05/30/2013   Procedure: MULTIPLE EXTRACTION Teeth number Six, Eight, Nine, Ten, Eleven, Thirteen, Nineteen, and Twenty Eight  WITH ALVEOLOPLASTY;  Surgeon: Georgia LopesScott M Jensen, DDS;  Location: [facility] OR;  Service: Oral Surgery;  Laterality: N/A;  . MULTIPLE TOOTH EXTRACTIONS    . SUPRAPUBIC CATHETER INSERTION  2009  . TOOTH EXTRACTION N/A 10/16/2016   Procedure: DENTAL RESTORATION/EXTRACTIONS NUMBER FIVE, FOURTEEN, TWENTY-TWO, TWENTY- THREE, TWENTY-FOUR, TWENTY-FIVE, TWENTY-SIX, TWENTY-SEVEN, THIRTY-ONE, AND ALVEOLOPLASTY.;  Surgeon: Ocie DoyneJensen, Scott, DDS;  Location: [facility] OR;  Service: Oral Surgery;  Laterality: N/A;  . TRACHEOSTOMY  2009   also removal of         Home Medications    Prior to Admission medications   Medication Sig Start Date End Date Taking? Authorizing Provider  acetaminophen (TYLENOL) 500 MG tablet Take 500 mg by mouth every 8 (eight) hours as needed for moderate pain.     [provider]  Amino Acids-Protein Hydrolys (FEEDING SUPPLEMENT, PRO-STAT SUGAR FREE 64,) LIQD Take 30 mLs by mouth every morning.    [provider]  aspirin 81 MG chewable tablet Chew 81 mg by mouth every morning.     [provider]  baclofen (LIORESAL) 10 MG tablet Take 15 mg by mouth 3 (three) times daily.  [provider]  barrier cream (NON-SPECIFIED) CREA Apply 1 application topically as needed (APPLY AFTER EACH CHANGING).    [provider]  bisacodyl (DULCOLAX) 5 MG EC tablet Take 10 mg by mouth daily as needed (FOR CONSTIPATION).     [provider]  brimonidine (ALPHAGAN) 0.2 % ophthalmic solution Place 1 drop into both eyes 2 (two) times daily.    [provider]  cephALEXin (KEFLEX) 500 MG capsule Take 1 capsule (500 mg total) by mouth 4 (four) times daily. 12/18/17   Rancour, Jeannett SeniorStephen, MD  divalproex (DEPAKOTE) 500 MG DR tablet Take 500 mg by mouth 4 (four)  times daily.     [provider]  escitalopram (LEXAPRO) 20 MG tablet Take 20 mg by mouth every morning.    [provider]  ezetimibe (ZETIA) 10 MG tablet Take 10 mg by mouth every morning.    [provider]  famotidine (PEPCID) 20 MG tablet Take 20 mg by mouth every morning.     [provider]  Fe-Succ-C-Thre-B12-Des Stomach (MULTIGEN PO) Take 1 tablet by mouth every morning.    [provider]  ferrous sulfate 325 (65 FE) MG tablet Take 325 mg by mouth daily with breakfast.    [provider]  guaiFENesin-dextromethorphan (ROBITUSSIN DM) 100-10 MG/5ML syrup Take 10 mLs by mouth every 4 (four) hours as needed for cough.    [provider]  insulin glargine (LANTUS) 100 UNIT/ML injection Inject 95 Units into the skin every morning.     [provider]  insulin lispro (HUMALOG) 100 UNIT/ML injection Inject 12 Units into the skin 3 (three) times daily before meals.    [provider]  lisinopril (PRINIVIL,ZESTRIL) 2.5 MG tablet Take 2.5 mg by mouth every morning.    [provider]  Menthol, Topical Analgesic, (BIOFREEZE EX) Apply 1 application topically 2 (two) times daily as needed (FOR HIP/LEG PAIN). 5% TOPICAL SOLUTION    [provider]  metFORMIN (GLUCOPHAGE) 1000 MG tablet Take 1,000-1,500 mg by mouth 2 (two) times daily. 1000 MG IN THE EVENING & 1500 MG IN THE MORNING (1000 &1700)    [provider]  Multiple Vitamins-Minerals (CERTAGEN) tablet Take 1 tablet by mouth daily.    [provider]  omega-3 acid ethyl esters (LOVAZA) 1 G capsule Take 2 g by mouth 2 (two) times daily. (1000&2200)    [provider]  ondansetron (ZOFRAN-ODT) 4 MG disintegrating tablet Take 4 mg by mouth every 4 (four) hours as needed for nausea or vomiting.    [provider]  oxyCODONE 10 MG TABS Take 1 tablet (10 mg total) by mouth every 4 (four) hours as needed for severe pain or  breakthrough pain. 06/23/17   McKenzie, Mardene CelestePatrick L, MD  polyvinyl alcohol (LIQUIFILM TEARS) 1.4 % ophthalmic solution Place 1 drop into both eyes 2 (two) times daily.    [provider]  pregabalin (LYRICA) 100 MG capsule Take 100 mg by mouth 3 (three) times daily.     [provider]  rosuvastatin (CRESTOR) 20 MG tablet Take 20 mg by mouth daily.    [provider]  senna-docusate (SENOKOT-S) 8.6-50 MG tablet Take 2 tablets by mouth daily at 10 pm.    [provider]  solifenacin (VESICARE) 10 MG tablet Take 10 mg by mouth every morning.    [provider]  vitamin C (ASCORBIC ACID) 500 MG tablet Take 500 mg by mouth 2 (two) times daily.    [provider]  Vitamin D, Ergocalciferol, (DRISDOL) 50000 units CAPS capsule Take 50,000 Units by mouth every 30 (thirty) days.    [provider]    Family History No family history on file.  Social History Social History   Tobacco Use  . Smoking status: Former Games developer  . Smokeless tobacco: Never Used  Substance Use Topics  . Alcohol use: No  . Drug use: No  Lives in a nursing home Nonambulatory due to right-sided paralysis   Allergies   No known allergies   Review of Systems Review of Systems  Unable to perform ROS: Other     Physical Exam Updated Vital Signs BP 130/68   Pulse 84   Temp 98 F (36.7 C) (Oral)   Resp 18   SpO2 99%   Physical Exam Vitals signs and nursing note reviewed.  HENT:     Head: Normocephalic.     Comments: Patient has a 3/4 cm superficial flap type abrasion on the top of his scalp, there is no active bleeding.    Right Ear: External ear normal.     Left Ear: External ear normal.     Nose: Nose normal.  Eyes:     Extraocular Movements: Extraocular movements intact.     Conjunctiva/sclera: Conjunctivae normal.     Pupils: Pupils are equal, round, and reactive to light.  Neck:     Musculoskeletal: Normal range of motion.  Cardiovascular:       Rate and Rhythm: Normal rate.  Pulmonary:     Effort: Pulmonary effort is normal. No respiratory distress.  Musculoskeletal:     Comments: Patient has no movement on his right upper and right lower extremity, he has padded booties on his right foot.  He is able to move his left upper extremity better than his left lower extremity.  Patient is noted to have a superficial linear abrasion around his left elbow with some faint bruising but there is no swelling and no joint effusion noted of the elbow.  Skin:    General: Skin is warm and dry.     Coloration: Skin is pale.  Neurological:     Mental Status: He is alert.     Comments: Patient has right sided hemiparesis and appears to have some dysarthria  Psychiatric:        Mood and Affect: Mood normal.        Behavior: Behavior normal.      ED Treatments / Results  Labs (all labs ordered are listed, but only abnormal results are displayed) Labs Reviewed - No data to display  EKG None  Radiology Ct Head Wo Contrast  Result Date: 05/20/2018 CLINICAL DATA:  Fall from bed EXAM: CT HEAD WITHOUT CONTRAST TECHNIQUE: Contiguous axial images were obtained from the base of the skull through the vertex without intravenous contrast. COMPARISON:  04/29/2018 FINDINGS: Brain: There is no mass effect, hemorrhage or extra-axial collection. There is a large, old left MCA territory infarct with associated encephalomalacia and ex vacuo dilatation of the left lateral ventricle. There is generalized atrophy that is greater than expected for age. Old right cerebellar infarct is unchanged. Vascular: No abnormal hyperdensity of the major intracranial arteries or dural venous sinuses. No intracranial atherosclerosis. Skull: The visualized skull base, calvarium and extracranial soft tissues are normal. Sinuses/Orbits: Frothy secretions within the right maxillary sinus. No mastoid or middle ear effusion. The orbits are normal. IMPRESSION: 1. No acute intracranial  abnormality. 2. Large, old left MCA territory infarct with associated  encephalomalacia and ex vacuo dilatation of the left lateral ventricle. 3. Old right cerebellar infarct Electronically Signed   By: Deatra Robinson M.D.   On: 05/20/2018 05:18    Procedures Procedures (including critical care time)  Medications Ordered in ED Medications - No data to display   Initial Impression / Assessment and Plan / ED Course  I have reviewed the triage vital signs and the nursing notes.  Pertinent labs & imaging results that were available during my care of the patient were reviewed by me and considered in my medical decision making (see chart for details).     CT of the head was done.  Wife was told about the CT results.  There is nothing new just the old stroke was seen.  She did not feel x-ray of his left elbow was indicated and I agree.  Patient was sent back to his facility.  They were given head injury precautions.  Final Clinical Impressions(s) / ED Diagnoses   Final diagnoses:  Fall at nursing home, initial encounter  Abrasion, scalp w/o infection  Contusion of left elbow, initial encounter    ED Discharge Orders    None     Plan discharge  Devoria Albe, MD, Concha Pyo, MD 05/20/18 [phone]

## 2018-07-13 ENCOUNTER — Ambulatory Visit: Payer: Self-pay | Admitting: Urology

## 2018-11-23 ENCOUNTER — Ambulatory Visit: Payer: Medicare Other | Admitting: Urology

## 2019-01-11 ENCOUNTER — Ambulatory Visit: Payer: Medicare Other | Admitting: Urology

## 2019-05-19 ENCOUNTER — Encounter (HOSPITAL_BASED_OUTPATIENT_CLINIC_OR_DEPARTMENT_OTHER): Payer: Medicare Other | Admitting: Internal Medicine

## 2019-05-19 ENCOUNTER — Other Ambulatory Visit: Payer: Self-pay

## 2019-05-19 DIAGNOSIS — G40909 Epilepsy, unspecified, not intractable, without status epilepticus: Secondary | ICD-10-CM | POA: Diagnosis not present

## 2019-05-19 DIAGNOSIS — E1122 Type 2 diabetes mellitus with diabetic chronic kidney disease: Secondary | ICD-10-CM | POA: Diagnosis not present

## 2019-05-19 DIAGNOSIS — K219 Gastro-esophageal reflux disease without esophagitis: Secondary | ICD-10-CM | POA: Diagnosis not present

## 2019-05-19 DIAGNOSIS — L97524 Non-pressure chronic ulcer of other part of left foot with necrosis of bone: Secondary | ICD-10-CM | POA: Insufficient documentation

## 2019-05-19 DIAGNOSIS — E1151 Type 2 diabetes mellitus with diabetic peripheral angiopathy without gangrene: Secondary | ICD-10-CM | POA: Diagnosis not present

## 2019-05-19 DIAGNOSIS — N182 Chronic kidney disease, stage 2 (mild): Secondary | ICD-10-CM | POA: Insufficient documentation

## 2019-05-19 DIAGNOSIS — I2510 Atherosclerotic heart disease of native coronary artery without angina pectoris: Secondary | ICD-10-CM | POA: Insufficient documentation

## 2019-05-19 DIAGNOSIS — Z86718 Personal history of other venous thrombosis and embolism: Secondary | ICD-10-CM | POA: Insufficient documentation

## 2019-05-19 DIAGNOSIS — I69351 Hemiplegia and hemiparesis following cerebral infarction affecting right dominant side: Secondary | ICD-10-CM | POA: Diagnosis not present

## 2019-05-19 DIAGNOSIS — E11621 Type 2 diabetes mellitus with foot ulcer: Secondary | ICD-10-CM | POA: Insufficient documentation

## 2019-05-19 DIAGNOSIS — E1140 Type 2 diabetes mellitus with diabetic neuropathy, unspecified: Secondary | ICD-10-CM | POA: Diagnosis not present

## 2019-05-19 DIAGNOSIS — I129 Hypertensive chronic kidney disease with stage 1 through stage 4 chronic kidney disease, or unspecified chronic kidney disease: Secondary | ICD-10-CM | POA: Insufficient documentation

## 2019-05-19 DIAGNOSIS — L97514 Non-pressure chronic ulcer of other part of right foot with necrosis of bone: Secondary | ICD-10-CM | POA: Insufficient documentation

## 2019-05-19 DIAGNOSIS — Z794 Long term (current) use of insulin: Secondary | ICD-10-CM | POA: Insufficient documentation

## 2019-05-19 DIAGNOSIS — H409 Unspecified glaucoma: Secondary | ICD-10-CM | POA: Insufficient documentation

## 2019-05-19 DIAGNOSIS — E785 Hyperlipidemia, unspecified: Secondary | ICD-10-CM | POA: Diagnosis not present

## 2019-05-19 DIAGNOSIS — F0390 Unspecified dementia without behavioral disturbance: Secondary | ICD-10-CM | POA: Insufficient documentation

## 2019-05-19 NOTE — Progress Notes (Signed)
[name], [name] ([id number]) Visit Report for 05/19/2019 Abuse/Suicide Risk Screen Details Patient Name: Date of Service: [name], [name] 05/19/2019 2:45 PM Medical Record [id number] Patient Account Number: [id number] Date of Birth/Sex: Treating RN: 12/25/1960 (59 y.o. [name] Primary Care Durand Wittmeyer: SYSTEM, PCP Other Clinician: Referring Shayann Garbutt: Treating Arnola Crittendon/Extender:Robson, Willia Craze, Angela Cox in Treatment: 0 Abuse/Suicide Risk Screen Items Answer ABUSE RISK SCREEN: Has anyone close to you tried to hurt or harm you recentlyo No Do you feel uncomfortable with anyone in your familyo No Has anyone forced you do things that you didnt want to doo No Electronic Signature(s) Signed: 05/19/2019 6:26:16 PM By: Zenaida Deed RN, BSN Entered By: Zenaida Deed on 05/19/2019 16:11:41 -------------------------------------------------------------------------------- Activities of Daily Living Details Patient Name: Date of Service: [name] 05/19/2019 2:45 PM Medical Record [id number] Patient Account Number: [id number] Date of Birth/Sex: Treating RN: 1960/05/06 (59 y.o. [name] Primary Care Makynleigh Breslin: SYSTEM, PCP Other Clinician: Referring Cher Egnor: Treating Chea Malan/Extender:Robson, Willia Craze, Angela Cox in Treatment: 0 Activities of Daily Living Items Answer Activities of Daily Living (Please select one for each item) Drive Automobile Not Able Take Medications Need Assistance Use Telephone Need Assistance Care for Appearance Not Able Use Toilet Need Assistance Bath / Shower Need Assistance Dress Self Need Assistance Feed Self Completely Able Walk Not Able Get In / Out Bed Not Able Housework Not Able Prepare Meals Not Able Handle Money Not Able Shop for Self Not Able Electronic Signature(s) Signed: 05/19/2019 6:26:16 PM By: Zenaida Deed RN, BSN Entered By: Zenaida Deed on 05/19/2019  16:12:17 -------------------------------------------------------------------------------- Education Screening Details Patient Name: Date of Service: [name] 05/19/2019 2:45 PM Medical Record [id number] Patient Account Number: [id number] Date of Birth/Sex: Treating RN: [birth year] (59 y.o. [name] Primary Care Kobi Mario: SYSTEM, PCP Other Clinician: Referring Ann-Marie Kluge: Treating Kamau Weatherall/Extender:Robson, Willia Craze, Angela Cox in Treatment: 0 Primary Learner Assessed: Patient Learning Preferences/Education Level/Primary Language Learning Preference: Explanation, Demonstration, Printed Material Highest Education Level: College or Above Preferred Language: English Cognitive Barrier Language Barrier: No Translator Needed: No Memory Deficit: Yes secondary to CVA Emotional Barrier: No Cultural/Religious Beliefs Affecting Medical Care: No Physical Barrier Impaired Vision: No Impaired Hearing: No Decreased Hand dexterity: Yes Limitations: right hand contracture Knowledge/Comprehension Knowledge Level: Medium Comprehension Level: Medium Ability to understand written Medium instructions: Ability to understand verbal Medium instructions: Motivation Anxiety Level: Calm Cooperation: Cooperative Education Importance: Acknowledges Need Interest in Health Problems: Asks Questions Perception: Coherent Willingness to Engage in Self- Medium Management Activities: Readiness to Engage in Self- Medium Management Activities: Electronic Signature(s) Signed: 05/19/2019 6:26:16 PM By: Zenaida Deed RN, BSN Entered By: Zenaida Deed on 05/19/2019 16:29:47 -------------------------------------------------------------------------------- Fall Risk Assessment Details Patient Name: Date of Service: [name] 05/19/2019 2:45 PM Medical Record [id number] Patient Account Number: [id number] Date of Birth/Sex: Treating RN: 04-20-1961 (60 y.o. [name] Primary Care Dona Walby: SYSTEM, PCP Other Clinician: Referring Kimberlea Schlag: Treating Fritz Cauthon/Extender:Robson, Willia Craze, Angela Cox in Treatment: 0 Fall Risk Assessment Items Have you had 2 or more falls in the last 12 monthso 0 No Have you had any fall that resulted in injury in the last 12 monthso 0 No FALLS RISK SCREEN History of falling - immediate or within 3 months 0 No Secondary diagnosis (Do you have 2 or more medical diagnoseso) 0 No Ambulatory aid None/bed rest/wheelchair/nurse 0 Yes Crutches/cane/walker 0 No Furniture 0 No Intravenous therapy Access/Saline/Heparin Lock 0 No Weak (short steps with or without shuffle, stooped but able to lift head 0 No while  walking, may seek support from furniture) Impaired (short steps with shuffle, may have difficulty arising from chair, 0 No head down, impaired balance) Mental Status Oriented to own ability 0 Yes Overestimates or forgets limitations 0 No Risk Level: Low Risk Score: 0 Electronic Signature(s) Signed: 05/19/2019 6:26:16 PM By: Baruch Gouty RN, BSN Entered By: Baruch Gouty on 05/19/2019 16:13:56 -------------------------------------------------------------------------------- Foot Assessment Details Patient Name: Date of Service: [name] 05/19/2019 2:45 PM Medical Record [id number] Patient Account Number: [id number] Date of Birth/Sex: Treating RN: 04-Apr-1961 (59 y.o. [name] Primary Care Yvonnie Schinke: SYSTEM, PCP Other Clinician: Referring Haidyn Kilburg: Treating Feiga Nadel/Extender:Robson, Ileene Rubens, Ermalene Searing in Treatment: 0 Foot Assessment Items [x]  Unable to perform due to altered mental status Site Locations + = Sensation present, - = Sensation absent, C = Callus, U = Ulcer R = Redness, W = Warmth, M = Maceration, PU = Pre-ulcerative lesion F = Fissure, S = Swelling, D = Dryness Assessment Right: Left: Other Deformity: No No Prior Foot Ulcer: Yes Yes Prior  Amputation: No No Charcot Joint: No No Ambulatory Status: Non-ambulatory Assistance Device: Wheelchair Gait: Electronic Signature(s) Signed: 05/19/2019 6:26:16 PM By: Baruch Gouty RN, BSN Entered By: Baruch Gouty on 05/19/2019 16:14:55 -------------------------------------------------------------------------------- Nutrition Risk Screening Details Patient Name: Date of Service: RJ, PEDROSA 05/19/2019 2:45 PM Medical Record [id number] Patient Account Number: [id number] Date of Birth/Sex: Treating RN: 05/24/1960 (59 y.o. [name] Primary Care Mathea Frieling: SYSTEM, PCP Other Clinician: Referring Dontel Harshberger: Treating Marzell Isakson/Extender:Robson, Ileene Rubens, Ermalene Searing in Treatment: 0 Height (in): 73 Weight (lbs): 207 Body Mass Index (BMI): 27.3 Nutrition Risk Screening Items Score Screening NUTRITION RISK SCREEN: I have an illness or condition that made me change the kind and/or 2 Yes amount of food I eat I eat fewer than two meals per day 3 Yes I eat few fruits and vegetables, or milk products 2 Yes I have three or more drinks of beer, liquor or wine almost every day 0 No I have tooth or mouth problems that make it hard for me to eat 2 Yes I don't always have enough money to buy the food I need 0 No I eat alone most of the time 0 No I take three or more different prescribed or over-the-counter drugs a day 1 Yes 0 No Without wanting to, I have lost or gained 10 pounds in the last six months I am not always physically able to shop, cook and/or feed myself 0 No Nutrition Protocols Good Risk Protocol Moderate Risk Protocol Provide education on elevated blood sugars and High Risk Proctocol 0 impact on wound healing, as applicable Risk Level: High Risk Score: 10 Electronic Signature(s) Signed: 05/19/2019 6:26:16 PM By: Baruch Gouty RN, BSN Entered By: Baruch Gouty on 05/19/2019 16:14:26

## 2019-05-19 NOTE — Progress Notes (Signed)
[name], [name] ([id number]) Visit Report for 05/19/2019 Chief Complaint Document Details Patient Name: Date of Service: [name], [name] 05/19/2019 2:45 PM Medical Record [id number] Patient Account Number: [id number] Date of Birth/Sex: Treating RN: 14-Oct-1960 (59 y.o. M) Primary Care Provider: SYSTEM, PCP Other Clinician: Referring Provider: Treating Provider/Extender:[name], [name], [name] in Treatment: 0 Information Obtained from: Patient Chief Complaint 05/19/2019; patient is here for review of wounds on his right dorsal second toe and his left lateral foot Electronic Signature(s) Signed: 05/19/2019 6:20:19 PM By: Frank Ham MD Entered By: [name] on 05/19/2019 17:43:28 -------------------------------------------------------------------------------- Debridement Details Patient Name: Date of Service: [name] 05/19/2019 2:45 PM Medical Record [id number] Patient Account Number: [id number] Date of Birth/Sex: Treating RN: 1961/02/01 (60 y.o. M) Primary Care Provider: SYSTEM, PCP Other Clinician: Referring Provider: Treating Provider/Extender:[name], [name], [name] in Treatment: 0 Debridement Performed for Wound #4 Right Toe Second Assessment: Performed By: Physician [name]., MD Debridement Type: Debridement Severity of Tissue Pre Bone involvement without necrosis Debridement: Level of Consciousness (Pre- Awake and Alert procedure): Pre-procedure Verification/Time Out Taken: Yes - 17:00 Start Time: 17:00 Pain Control: Other : benzocaine, 20% Total Area Debrided (L x W): 0.4 (cm) x 0.3 (cm) = 0.12 (cm) Tissue and other material Bone, Slough, Skin: Epidermis, Slough debrided: Level: Skin/Subcutaneous Tissue/Muscle/Bone Debridement Description: Excisional Instrument: Curette Bleeding: Minimum Hemostasis Achieved: Pressure End Time: 17:01 Procedural Pain: 0 Post Procedural Pain: 0 Response to  Treatment: Procedure was tolerated well Level of Consciousness Awake and Alert (Post-procedure): Post Debridement Measurements of Total Wound Length: (cm) 0.4 Width: (cm) 0.3 Depth: (cm) 0.1 Volume: (cm) 0.009 Character of Wound/Ulcer Post Improved Debridement: Bone involvement without Severity of Tissue Post Debridement: necrosis Post Procedure Diagnosis Same as Pre-procedure Electronic Signature(s) Signed: 05/19/2019 6:20:19 PM By: Frank Ham MD Entered By: [name] on 05/19/2019 17:37:16 -------------------------------------------------------------------------------- Debridement Details Patient Name: Date of Service: Frank Bears D. 05/19/2019 2:45 PM Medical Record [id number] Patient Account Number: [id number] Date of Birth/Sex: Treating RN: 03/12/1961 (59 y.o. M) Primary Care Provider: SYSTEM, PCP Other Clinician: Referring Provider: Treating Provider/Extender:[name], [name], [name] in Treatment: 0 Debridement Performed for Wound #3 Left,Lateral Foot Assessment: Performed By: Physician [name]., MD Debridement Type: Debridement Severity of Tissue Pre Bone involvement without necrosis Debridement: Level of Consciousness (Pre- Awake and Alert procedure): Pre-procedure Verification/Time Out Taken: Yes - 17:00 Start Time: 17:00 Pain Control: Other : benzocaine, 20% Total Area Debrided (L x W): 2.2 (cm) x 1.7 (cm) = 3.74 (cm) Tissue and other material Viable, Non-Viable, Bone, Slough, Skin: Dermis , Slough Viable, Non-Viable, Bone, Slough, Skin: Dermis , Slough debrided: Level: Skin/Subcutaneous Tissue/Muscle/Bone Debridement Description: Excisional Instrument: Blade, Rongeur Specimen: Tissue Culture Number of Specimens Taken: 2 Bleeding: Minimum Hemostasis Achieved: Pressure End Time: 17:01 Procedural Pain: 0 Post Procedural Pain: 0 Response to Treatment: Procedure was tolerated well Level of Consciousness Awake  and Alert (Post-procedure): Post Debridement Measurements of Total Wound Length: (cm) 2.2 Width: (cm) 1.7 Depth: (cm) 1.8 Volume: (cm) 5.287 Character of Wound/Ulcer Post Improved Debridement: Bone involvement without Severity of Tissue Post Debridement: necrosis Post Procedure Diagnosis Same as Pre-procedure Electronic Signature(s) Signed: 05/19/2019 6:20:19 PM By: Frank Ham MD Entered By: [name] on 05/19/2019 17:42:23 -------------------------------------------------------------------------------- HPI Details Patient Name: Date of Service: Frank Bears D. 05/19/2019 2:45 PM Medical Record [id number] Patient Account Number: [id number] Date of Birth/Sex: Treating RN: [birth year] (59 y.o. M) Primary Care Provider: SYSTEM, PCP Other Clinician: Referring Provider: Treating Provider/Extender:[name], [name], [name] in  Treatment: 0 History of Present Illness HPI Description: ADMISSION 05/19/2019 [name] is now a 59 year old man with type 2 diabetes. He was actually in this clinic in 2006 in 2008. I do not have these records. More recently I actually looked after him in primary care with [name] up until 2016. Notable for the fact that I treated him for osteomyelitis in the right foot for an MRSA infection in the facility in 2014 or thereabouts. Gave him a prolonged course of vancomycin in the facility. My notes at the time do not list him has having significant PAD. He has severe diabetic neuropathy. The patient apparently has had wounds on his feet since November. This includes the right second toe and the left foot at the mid part of the fifth metatarsal. They are using Santyl on the left foot I am not clear what they are using to the right toe. I do not know if there is been any imaging studies done. It does not appear that his vascular status is been rechecked. 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 M) Primary Care Provider: SYSTEM, PCP Other Clinician: Referring Provider: Treating Provider/Extender:Khyleigh Furney, Willia Craze, Angela Cox in Treatment: 0 Constitutional Sitting or standing Blood Pressure is within target range for patient.. Pulse regular and within target range for patient.Marland Kitchen Respirations regular, non-labored and within target range.. Temperature is normal and within the target range for the patient.Marland Kitchen Appears in no distress. Respiratory work of breathing is normal. Cardiovascular Heart rhythm and rate regular, without murmur or gallop.. Could not feel either one of his femoral pulses bilaterally. Pedal pulses absent bilaterally.. Integumentary (Hair, Skin) There is no erythema around either one of the wounds. Neurological Right hemiparesis is noted this is longstanding. Psychiatric No major abnormality. He was pleasant and cooperative. Notes Wound exam Right foot over the PIP of the right second toe. There is exposed bone sticking through this wound. This was debrided with a #3 curette. No overt infection around the wound. The major areas on the lateral left foot about the mid fifth metatarsal shaft. There are 2 areas of this wound held together by a bridge of skin and subcutaneous tissue I removed this with a #10 scalpel and  pickups. I then turned my attention to exposed bone. Using rongeurs I was able to obtain specimens for CandS and pathology. Wound bed was then vigorously washed with Anasept. Electronic Signature(s) Signed: 05/19/2019 6:20:19 PM By: Baltazar Najjar MD Entered By: Baltazar Najjar on 05/19/2019 17:49:17 -------------------------------------------------------------------------------- Physician Orders Details Patient Name: Date of Service: [name] 05/19/2019 2:45 PM Medical Record [id number] Patient Account Number: [id number] Date of Birth/Sex: Treating RN: May 09, 1960 (59 y.o. [name] Primary Care Provider: SYSTEM, PCP Other Clinician: Referring Provider: Treating Provider/Extender:Naomi Fitton, Willia Craze, Angela Cox in Treatment: 0 Verbal / Phone Orders: No Diagnosis Coding Follow-up Appointments Return Appointment in 2 weeks. Dressing Change Frequency Change dressing every day. Skin Barriers/Peri-Wound Care Skin Prep Wound Cleansing Clean wound with Wound Cleanser Primary Wound Dressing Wound #3 Left,Lateral Foot Calcium Alginate  with Silver Wound #4 Right Toe Second Calcium Alginate with Silver Secondary Dressing Wound #3 Left,Lateral Foot Kerlix/Rolled Gauze Dry Gauze Wound #4 Right Toe Second Kerlix/Rolled Gauze Dry Gauze Services and Therapies Arterial Studies- Bilateral - To be done at [facility]; ABI's, TBI's, and Arterial Dopplers related to wounds to wounds to left lateral foot and right 2nd toe. Y09.983 Electronic Signature(s) Signed: 05/19/2019 5:57:08 PM By: Cherylin Mylar Signed: 05/19/2019 6:20:19 PM By: Baltazar Najjar MD Entered By: Cherylin Mylar on 05/19/2019 17:14:11 -------------------------------------------------------------------------------- Prescription 05/19/2019 Patient Name: [name]. Provider: Baltazar Najjar MD Date of Birth: [birth year] NPI#: [id number] Sex: Judie Petit DEA#: [id number] Phone #:  [phone] License #: [id number] Patient Address: Eligha Bridegroom [facility] [address] [place] [address] [place], [place] [address] [place], [place] [address] [phone] Allergies No Known Allergies Provider's Orders Arterial Studies- Bilateral - To be done at [facility]; ABI's, TBI's, and Arterial Dopplers related to wounds to wounds to left lateral foot and right 2nd toe. Y81.448 Signature(s): Date(s): Electronic Signature(s) Signed: 05/19/2019 5:57:08 PM By: Cherylin Mylar Signed: 05/19/2019 6:20:19 PM By: Baltazar Najjar MD Entered By: Cherylin Mylar on 05/19/2019 17:14:11 --------------------------------------------------------------------------------  Problem List Details Patient Name: Date of Service: [name] 05/19/2019 2:45 PM Medical Record [id number] Patient Account Number: [id number] Date of Birth/Sex: Treating RN: 10-02-1960 (59 y.o. 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 L97.514 Non-pressure chronic ulcer of other part of right foot 05/19/2019 No Yes with necrosis of bone E11.51 Type 2 diabetes mellitus with diabetic peripheral 05/19/2019 No Yes angiopathy without gangrene Inactive Problems Resolved Problems Electronic Signature(s) Signed: 05/19/2019 6:20:19 PM By: Baltazar Najjar MD Entered By: Baltazar Najjar on 05/19/2019 17:36:22 -------------------------------------------------------------------------------- Progress Note Details Patient Name: Date of Service: [name] 05/19/2019 2:45 PM Medical  Record [id number] Patient Account Number: [id number] Date of Birth/Sex: Treating RN: November 03, 1960 (59 y.o. M) Primary Care Provider: SYSTEM, PCP Other Clinician: Referring Provider: Treating Provider/Extender:Jackquline Branca, Willia Craze, Angela Cox in Treatment: 0 Subjective Chief Complaint Information obtained from Patient 05/19/2019; patient is here for review of wounds on his right dorsal second toe and his left lateral foot History of Present Illness (HPI) ADMISSION 05/19/2019 Mr. Drumwright is now a 59 year old man with type 2 diabetes. He was actually in this clinic in 2006 in 2008. I do not have these records. More recently I actually looked after him in primary care with [facility] up until 2016. Notable for the fact that I treated him for osteomyelitis in the right foot for an MRSA infection in the facility in 2014 or thereabouts. Gave him a prolonged course of vancomycin in the facility. My notes at the time do not list him has having significant PAD. He has severe diabetic neuropathy. The patient apparently has had wounds on his feet since November. This includes the right second toe and the left foot at the mid part of the fifth metatarsal. They are using Santyl on the left foot I am not clear what they are using to the right toe. I do not know if there is been any imaging studies done. It does not appear that his vascular status is  been rechecked. 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 Social History Never smoker, Marital Status - Married, Alcohol Use - Never, Drug Use - No History, Caffeine Use - Daily - coffee. Medical  History Eyes Patient has history of Glaucoma Cardiovascular Patient has history of Coronary Artery Disease, Deep Vein Thrombosis, Hypertension, Peripheral Arterial Disease Endocrine Patient has history of Type II Diabetes Genitourinary Denies history of End Stage Renal Disease Integumentary (Skin) Denies history of History of Burn Neurologic Patient has history of Dementia, Neuropathy, Seizure Disorder - epilepsy Oncologic Denies history of Received Chemotherapy, Received Radiation Psychiatric Denies history of Anorexia/bulimia, Confinement Anxiety Patient is treated with Insulin. Blood sugar is tested. Hospitalization/Surgery History - teeth extractions. - suprapubic cath placement. - tracheostomy. Medical And Surgical History Notes Ear/Nose/Mouth/Throat dysphagia, Cardiovascular hyperlipidemia Gastrointestinal GERD, dysphagia Genitourinary neurogenic bladder, hydronephrosis, suprapubic cath, CKD stage 2 Neurologic right side hemiplegia, CVA Psychiatric depression Review of Systems (ROS) Constitutional Symptoms (General Health) Denies complaints or symptoms of Fatigue, Fever, Chills, Marked Weight Change. Ear/Nose/Mouth/Throat Denies complaints or symptoms of Chronic sinus problems or rhinitis. Respiratory Denies complaints or symptoms of Chronic or frequent coughs, Shortness of Breath. Integumentary (Skin) Complains or has symptoms of Wounds - right 2nd toe and left lateral foot. Musculoskeletal Complains or has symptoms of Muscle Weakness. Denies complaints or symptoms of Muscle Pain. Neurologic Complains or has symptoms of Numbness/parasthesias. Psychiatric Denies complaints or symptoms of Claustrophobia, Suicidal. Objective Constitutional Sitting or standing Blood Pressure is within target range for patient.. Pulse regular and within target range for patient.Marland Kitchen. Respirations regular, non-labored and within target range.. Temperature is normal and within the  target range for the patient.Marland Kitchen. Appears in no distress. Vitals Time Taken: 3:54 PM, Height: 73 in, Source: Stated, Weight: 207 lbs, Source: Stated, BMI: 27.3, Temperature: 98.1 F, Pulse: 81 bpm, Respiratory Rate: 18 breaths/min, Blood Pressure: 116/58 mmHg, Capillary Blood Glucose: 448 mg/dl. General Notes: glucose per facility documentation on 05/15/19 Respiratory work of breathing is normal. Cardiovascular Heart rhythm and rate regular, without murmur or gallop.. Could not feel either one of his femoral pulses bilaterally. Pedal pulses absent bilaterally.. Neurological Right hemiparesis is noted this is longstanding. Psychiatric No major abnormality. He was pleasant and cooperative. General Notes: Wound exam ooRight foot over the PIP of the right second toe. There is exposed bone sticking through this wound. This was debrided with a #3 curette. No overt infection around the wound. ooThe major areas on the lateral left foot about the mid fifth metatarsal shaft. There are 2 areas of this wound held together by a bridge of skin and subcutaneous tissue I removed this with a #10 scalpel and pickups. I then turned my attention to exposed bone. Using rongeurs I was able to obtain specimens for CandS and pathology. Wound bed was then vigorously washed with Anasept. Integumentary (Hair, Skin) There is no erythema around either one of the wounds. Wound #3 status is Open. Original cause of wound was Gradually Appeared. The wound is located on the Left,Lateral Foot. The wound measures 2.2cm length x 1.7cm width x 1.8cm depth; 2.937cm^2 area and 5.287cm^3 volume. There is bone, muscle, tendon, and Fat Layer (Subcutaneous Tissue) Exposed exposed. There is no tunneling noted, however, there is undermining starting at 7:00 and ending at 4:00  with a maximum distance of 1.2cm. There is a large amount of serosanguineous drainage noted. The wound margin is well defined and not attached to the wound base.  There is small (1-33%) red granulation within the wound bed. There is a large (67- 100%) amount of necrotic tissue within the wound bed including Adherent Slough and Necrosis of Muscle. Wound #4 status is Open. Original cause of wound was Gradually Appeared. The wound is located on the Right Toe Second. The wound measures 0.4cm length x 0.3cm width x 0.1cm depth; 0.094cm^2 area and 0.009cm^3 volume. There is bone and Fat Layer (Subcutaneous Tissue) Exposed exposed. There is no tunneling or undermining noted. There is a small amount of serosanguineous drainage noted. The wound margin is distinct with the outline attached to the wound base. There is medium (34-66%) red granulation within the wound bed. 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 There was a Excisional Skin/Subcutaneous Tissue/Muscle/Bone Debridement with a total area of 3.74 sq cm performed by Maxwell Caul., MD. With the following instrument(s): Blade, and Rongeur to remove Viable and Non-Viable tissue/material. Material removed includes Bone,Slough, and Skin: Dermis after achieving pain control using Other (benzocaine, 20%). 2 specimens were taken by a Tissue Culture and sent to the lab per facility protocol. A time out was conducted at 17:00, prior to the start of the procedure. A Minimum amount of bleeding was controlled with Pressure. The procedure was tolerated well with a pain level of 0 throughout and a pain level of 0 following the procedure. Post  Debridement Measurements: 2.2cm length x 1.7cm width x 1.8cm depth; 5.287cm^3 volume. Character of Wound/Ulcer Post Debridement is improved. Severity of Tissue Post Debridement is: Bone involvement without necrosis. Post procedure Diagnosis Wound #3: Same as Pre-Procedure Wound #4 Pre-procedure diagnosis of Wound #4 is a Diabetic Wound/Ulcer of the Lower Extremity located on the Right Toe Second .Severity of Tissue Pre Debridement is: Bone involvement without necrosis. There was a Excisional Skin/Subcutaneous Tissue/Muscle/Bone Debridement with a total area of 0.12 sq cm performed by Maxwell Caul., MD. With the following instrument(s): Curette Material removed includes Bone,Slough, and Skin: Epidermis after achieving pain control using Other (benzocaine, 20%). No specimens were taken. A time out was conducted at 17:00, prior to the start of the procedure. A Minimum amount of bleeding was controlled with Pressure. The procedure was tolerated well with a pain level of 0 throughout and a pain level of 0 following the procedure. Post Debridement Measurements: 0.4cm length x 0.3cm width x 0.1cm depth; 0.009cm^3 volume. Character of Wound/Ulcer Post Debridement is improved. Severity of Tissue Post Debridement is: Bone involvement without necrosis. Post procedure Diagnosis Wound #4: Same as Pre-Procedure Plan Follow-up Appointments: Return Appointment in 2 weeks. Dressing Change Frequency: Change dressing every day. Skin Barriers/Peri-Wound Care: Skin Prep Wound Cleansing: Clean wound with Wound Cleanser Primary Wound Dressing: Wound #3 Left,Lateral Foot: Calcium Alginate with Silver Wound #4 Right Toe Second: Calcium Alginate with Silver Secondary Dressing: Wound #3 Left,Lateral Foot: Kerlix/Rolled Gauze Dry Gauze Wound #4 Right Toe Second: Kerlix/Rolled Gauze Dry Gauze Services and Therapies ordered  were: Arterial Studies- Bilateral - [facility]; ABI's, TBI's, and  Arterial Dopplers related to wounds to wounds to left lateral foot and right 2nd toe. E11.621 1. We are using silver alginate to both wound areas 2. Await specimen of bone for pathology and culture before antibiotics. He has a history of MRSA but that was in the right foot and many years ago. 3. He will need noninvasive arterial studies including ABIs TBI's and arterial Dopplers 4. Have spoken to his wife on the phone and told her that the area on the left is a limb threatening situation. This will be especially true if as I suspect there is critical limb ischemia. 5. For now I am not going to do imaging studies. Await cultures and arterial studies first Electronic Signature(s) Signed: 05/19/2019 6:20:19 PM By: Baltazar Najjarobson, Tiesha Marich MD Entered By: Baltazar Najjarobson, Ariani Seier on 05/19/2019 17:51:26 -------------------------------------------------------------------------------- HxROS Details Patient Name: Date of Service: [name], Frank D. 05/19/2019 2:45 PM Medical Record [id number] Patient Account Number: [id number] Date of Birth/Sex: Treating RN: [birth year] (59 y.o. Damaris SchoonerM) Boehlein, Linda Primary Care Provider: SYSTEM, PCP Other Clinician: Referring Provider: Treating Provider/Extender:Davielle Lingelbach, Willia CrazeMichael Moore, Angela Coxonald Weeks in Treatment: 0 Constitutional Symptoms (General Health) Complaints and Symptoms: Negative for: Fatigue; Fever; Chills; Marked Weight Change Ear/Nose/Mouth/Throat Complaints and Symptoms: Negative for: Chronic sinus problems or rhinitis Medical History: Past Medical History Notes: dysphagia, Respiratory Complaints and Symptoms: Negative for: Chronic or frequent coughs; Shortness of Breath Integumentary (Skin) Complaints and Symptoms: Positive for: Wounds - right 2nd toe and left lateral foot Medical History: Negative for: History of Burn Musculoskeletal Complaints and Symptoms: Positive for: Muscle Weakness Negative for: Muscle Pain Neurologic Complaints and  Symptoms: Positive for: Numbness/parasthesias Medical History: Positive for: Dementia; Neuropathy; Seizure Disorder - epilepsy Past Medical History Notes: right side hemiplegia, CVA Psychiatric Complaints and Symptoms: Negative for: Claustrophobia; Suicidal Medical History: Negative for: Anorexia/bulimia; Confinement Anxiety Past Medical History Notes: depression Eyes Medical History: Positive for: Glaucoma Hematologic/Lymphatic Cardiovascular Medical History: Positive for: Coronary Artery Disease; Deep Vein Thrombosis; Hypertension; Peripheral Arterial Disease Past Medical History Notes: hyperlipidemia Gastrointestinal Medical History: Past Medical History Notes: GERD, dysphagia Endocrine Medical History: Positive for: Type II Diabetes Treated with: Insulin Blood sugar tested every day: Yes Tested : monitored at facility Genitourinary Medical History: Negative for: End Stage Renal Disease Past Medical History Notes: neurogenic bladder, hydronephrosis, suprapubic cath, CKD stage 2 Immunological Oncologic Medical History: Negative for: Received Chemotherapy; Received Radiation HBO Extended History Items Eyes: Glaucoma Immunizations Pneumococcal Vaccine: Received Pneumococcal Vaccination: Yes Implantable Devices No devices added Hospitalization / Surgery History Type of Hospitalization/Surgery teeth extractions suprapubic cath placement tracheostomy Family and Social History Unknown History: Yes; Never smoker; Marital Status - Married; Alcohol Use: Never; Drug Use: No History; Caffeine Use: Daily - coffee; Financial Concerns: No; Food, Clothing or Shelter Needs: No; Support System Lacking: No; Transportation Concerns: No Electronic Signature(s) Signed: 05/19/2019 6:20:19 PM By: Baltazar Najjarobson, Danya Spearman MD Signed: 05/19/2019 6:26:16 PM By: Zenaida DeedBoehlein, Linda RN, BSN Entered By: Zenaida DeedBoehlein, Linda on 05/19/2019  [id number] -------------------------------------------------------------------------------- SuperBill Details Patient Name: Date of Service: [name], Teshawn D. 05/19/2019 Medical Record [id number] Patient Account Number: [id number] Date of Birth/Sex: Treating RN: [birth year] (59 y.o. M) Primary Care Provider: SYSTEM, PCP Other Clinician: Referring Provider: Treating Provider/Extender:Eufemio Strahm, Willia CrazeMichael Moore, Angela Coxonald Weeks in Treatment: 0 Diagnosis Coding ICD-10 Codes Code Description E11.621 Type 2 diabetes mellitus with foot ulcer L97.524 Non-pressure chronic ulcer of other part of left foot with necrosis of bone L97.514 Non-pressure chronic ulcer of other part of  right foot with necrosis of bone E11.51 Type 2 diabetes mellitus with diabetic peripheral angiopathy without gangrene Facility Procedures CPT4 Code Description: 31540086 11044 - DEB BONE 20 SQ CM/< ICD-10 Diagnosis Description L97.524 Non-pressure chronic ulcer of other part of left foot L97.514 Non-pressure chronic ulcer of other part of right foot Modifier: with necrosis of bo with necrosis of b Quantity: 1 ne one Physician Procedures CPT4 Description: Code [id number] - WC PHYS LEVEL 4 - NEW PT ICD-10 Diagnosis Description E11.621 Type 2 diabetes mellitus with foot ulcer L97.524 Non-pressure chronic ulcer of other part of left foot with nec L97.514 Non-pressure chronic ulcer of  other part of right foot with ne E11.51 Type 2 diabetes mellitus with diabetic peripheral angiopathy w Modifier: 25 rosis of bone crosis of bone ithout gangren Quantity: 1 e CPT4 Description: 2458099 Debridement; bone (includes epidermis, dermis, subQ tissue, muscle and/or fascia, if performed) 1st 20 sqcm or less ICD-10 Diagnosis Description L97.524 Non-pressure chronic ulcer of other part of left foot with necros L97.514  Non-pressure chronic ulcer of other part of right foot with necro Modifier: is of bone sis of bone Quantity: 1 Electronic  Signature(s) Signed: 05/19/2019 6:20:19 PM By: Baltazar Najjar MD Entered By: Baltazar Najjar on 05/19/2019 17:51:58

## 2019-05-19 NOTE — Progress Notes (Addendum)
MOUSTAPHA, TOOKER ([id number]) Visit Report for 05/19/2019 Allergy List Details Patient Name: Date of Service: [name], [name] 05/19/2019 2:45 PM Medical Record [id number] Patient Account Number: [id number] Date of Birth/Sex: Treating RN: [birth year] (59 y.o. Male) Baruch Gouty Primary Care Delana Manganello: SYSTEM, PCP Other Clinician: Referring Lamira Borin: Treating Verta Riedlinger/Extender:Robson, Ileene Rubens, Ermalene Searing in Treatment: 0 Allergies Active Allergies No Known Allergies Allergy Notes Electronic Signature(s) Signed: 05/19/2019 6:26:16 PM By: Baruch Gouty RN, BSN Entered By: Baruch Gouty on 05/19/2019 16:01:36 -------------------------------------------------------------------------------- [place] Details Patient Name: Date of Service: Frank Mau. 05/19/2019 2:45 PM Medical Record [id number] Patient Account Number: [id number] Date of Birth/Sex: Treating RN: [birth year] (58 y.o. Male) Baruch Gouty Primary Care Lewis Keats: SYSTEM, PCP Other Clinician: Referring Gabreil Yon[place]: Treating Velva Molinari/Extender:Robson, Ileene Rubens, Ermalene Searing in Treatment: 0 Visit Information Patient Arrived: Wheel Chair Arrival Time: 15:35 Accompanied By: facility staff Transfer Assistance: Other Patient Identification Verified: Yes Secondary Verification Process Yes Completed: Patient Requires Transmission-Based No Precautions: Patient Has Alerts: Yes Patient Alerts: R and L ABIs N/C Notes stayed in facility chair Electronic Signature(s) Signed: 05/19/2019 6:26:16 PM By: Baruch Gouty RN, BSN Entered By: Rosemarie Ax History Since Last Visit Pain Present Now: No -------------------------------------------------------------------------------- Lower Extremity Assessment Details Patient Name: Date of Service: [name], [name] 05/19/2019 2:45 PM Medical Record [id number] Patient Account Number: [id number] Date of Birth/Sex: Treating RN: December 29, 1960 (58  y.o. Male) Baruch Gouty Primary Care Aggie Douse: SYSTEM, PCP Other Clinician: Referring Shakerria Parran: Treating Florita Nitsch/Extender:Robson, Ileene Rubens, Ermalene Searing in Treatment: 0 Edema Assessment Assessed: [Left: No] [Right: No] Edema: [Left: No] [Right: No] Calf Left: Right: Point of Measurement: cm From Medial Instep 29 cm 28.5 cm Ankle Left: Right: Point of Measurement: cm From Medial Instep 19.5 cm 19.6 cm Vascular Assessment Pulses: Dorsalis Pedis Palpable: [Left:Yes] [Right:Yes] Blood Pressure: Brachial: [Left:116] [Right:116] Notes bilateral DP and PT pulses noncompressible Electronic Signature(s) Signed: 05/19/2019 6:26:16 PM By: Baruch Gouty RN, BSN Entered By: Baruch Gouty on 05/19/2019 16:24:18 -------------------------------------------------------------------------------- Multi Wound Chart Details Patient Name: Date of Service: Frank Bears D. 05/19/2019 2:45 PM Medical Record [id number] Patient Account Number: [id number] Date of Birth/Sex: Treating RN: Sep 25, 1960 (59 y.o. Male) Primary Care Tito Ausmus: SYSTEM, PCP Other Clinician: Referring Kayah Hecker: Treating Nilson Tabora/Extender:Robson, Ileene Rubens, Ermalene Searing in Treatment: 0 Vital Signs Height(in): 34 Capillary Blood 448 Glucose(mg/dl): Weight(lbs): 207 Pulse(bpm): 40 Body Mass Index(BMI): 27 Blood Pressure(mmHg): 116/58 Temperature(F): 98.1 Respiratory 18 Rate(breaths/min): Photos: [3:No Photos] [4:No Photos] [N/A:N/A] Wound Location: [3:Left Foot - Lateral] [4:Right Toe Second] [N/A:N/A] Wounding Event: [3:Gradually Appeared] [4:Gradually Appeared] [N/A:N/A] Primary Etiology: [3:Diabetic Wound/Ulcer of the Diabetic Wound/Ulcer of the N/A Lower Extremity] [4:Lower Extremity] Comorbid History: [3:Glaucoma, Coronary Artery Glaucoma, Coronary Artery N/A Disease, Deep Vein Thrombosis, Hypertension, Thrombosis, Hypertension, Peripheral Arterial Disease, Peripheral Arterial Disease, Type II  Diabetes, Dementia, Type II Diabetes,  Dementia, Neuropathy, Seizure Disorder] [4:Disease, Deep Vein Neuropathy, Seizure Disorder] Date Acquired: [3:03/05/2019] [4:05/19/2019] [N/A:N/A] Weeks of Treatment: [3:0] [4:0] [N/A:N/A] Wound Status: [3:Open] [4:Open] [N/A:N/A] Measurements L x W x D 2.2x1.7x1.8 [4:0.4x0.3x0.1] [N/A:N/A] (cm) Area (cm) : [3:2.937] [4:0.094] [N/A:N/A] Volume (cm) : [3:5.287] [4:0.009] [N/A:N/A] Starting Position 1 7 (o'clock): Ending Position 1 [3:4] (o'clock): Maximum Distance 1 [3:1.2] (cm): Undermining: [3:Yes] [4:No] [N/A:N/A] Classification: [3:Grade 2] [4:Grade 2] [N/A:N/A] Exudate Amount: [3:Large] [4:Small] [N/A:N/A] Exudate Type: [3:Serosanguineous] [4:Serosanguineous] [N/A:N/A] Exudate Color: [3:red, brown] [4:red, brown] [N/A:N/A] Wound Margin: [3:Well defined, not attached Distinct, outline attached N/A] Granulation Amount: [3:Small (1-33%)] [4:Medium (34-66%)] [N/A:N/A] Granulation Quality: [3:Red] [4:Red] [N/A:N/A] Necrotic Amount: [3:Large (67-100%)] [4:N/A] [N/A:N/A] Exposed Structures: [3:Fat Layer (  Subcutaneous Fat Layer (Subcutaneous N/A Tissue) Exposed: Yes Tendon: Yes Muscle: Yes Bone: Yes Fascia: No Joint: No] [4:Tissue) Exposed: Yes Bone: Yes Fascia: No Tendon: No Muscle: No Joint: No] Epithelialization: [3:None] [4:None] [N/A:N/A] Debridement: [3:Debridement - Excisional Debridement - Excisional N/A] Pre-procedure [3:17:00] [4:17:00] Verification/Time Out Taken: Pain Control: [3:Other] [4:Other] Tissue Debrided: [3:Bone, Slough] [4:Bone, Slough] Level: [3:Skin/Subcutaneous Tissue/Muscle/Bone] [4:Skin/Subcutaneous Tissue/Muscle/Bone] Debridement Area (sq cm):3.74 [4:0.12] Instrument: [3:Blade, Rongeur] [4:Curette] Bleeding: [3:Minimum] [4:Minimum] Hemostasis Achieved: [3:Pressure] [4:Pressure] Procedural Pain: [3:0] [4:0] Post Procedural Pain: [3:0] [4:0] Debridement Treatment Procedure was tolerated [4:Procedure was  tolerated] Response: [3:well] [4:well] Post Debridement [3:2.2x1.7x1.8] [4:0.4x0.3x0.1] Measurements L x W x D (cm) Post Debridement [3:5.287] [4:0.009] Volume: (cm) Procedures Performed: Biopsy [3:Debridement] Treatment Notes Electronic Signature(s) Signed: 05/19/2019 6:20:19 PM By: Baltazar Najjar MD Entered By: Baltazar Najjar on 05/19/2019 17:42:02 -------------------------------------------------------------------------------- Multi-Disciplinary Care Plan Details Patient Name: Date of Service: Frank Fantasia D. 05/19/2019 2:45 PM Medical Record [id number] Patient Account Number: [id number] Date of Birth/Sex: Treating RN: 11-30-1960 (58 y.o. Male) Cherylin Mylar Primary Care Sumiye Hirth: SYSTEM, PCP Other Clinician: Referring Freddy Kinne: Treating Khoen Genet/Extender:Robson, Willia Craze, Angela Cox in Treatment: 0 Active Inactive Nutrition Nursing Diagnoses: Imbalanced nutrition Goals: Patient/caregiver will maintain therapeutic glucose control Date Initiated: 05/19/2019 Target Resolution Date: 06/09/2019 Goal Status: Active Interventions: Provide education on elevated blood sugars and impact on wound healing Notes: Wound/Skin Impairment Nursing Diagnoses: Impaired tissue integrity Goals: Ulcer/skin breakdown will have a volume reduction of 50% by week 8 Date Initiated: 05/19/2019 Target Resolution Date: 06/09/2019 Goal Status: Active Interventions: Provide education on ulcer and skin care Notes: Electronic Signature(s) Signed: 05/19/2019 5:57:08 PM By: Cherylin Mylar Entered By: Cherylin Mylar on 05/19/2019 16:56:57 -------------------------------------------------------------------------------- Pain Assessment Details Patient Name: Date of Service: [name], [name] 05/19/2019 2:45 PM Medical Record [id number] Patient Account Number: [id number] Date of Birth/Sex: Treating RN: [birth year] (58 y.o. Male) Zenaida Deed Primary Care Van Ehlert: SYSTEM,  PCP Other Clinician: Referring Lynn Recendiz: Treating Edvin Albus/Extender:Robson, Willia Craze, Angela Cox in Treatment: 0 Active Problems Location of Pain Severity and Description of Pain Patient Has Paino No Site Locations Rate the pain. Current Pain Level: 0 Pain Management and Medication Current Pain Management: Electronic Signature(s) Signed: 05/19/2019 6:26:16 PM By: Zenaida Deed RN, BSN Entered By: Zenaida Deed on 05/19/2019 16:28:52 -------------------------------------------------------------------------------- Patient/Caregiver Education Details Patient Name: [name] 1/15/2021andnbsp2:45 Date of Service: PM Medical Record [id number] Number: Patient Account Number: [id number] Treating RN: April 09, 1961 (58 y.o. Cherylin Mylar Date of Birth/Gender: Male) Other Clinician: Primary Care Physician: SYSTEM, PCP Treating Baltazar Najjar Referring Physician: Physician/Extender: Pablo Lawrence in Treatment: 0 Education Assessment Education Provided To: Patient Education Topics Provided Elevated Blood Sugar/ Impact on Healing: Methods: Printed Responses: State content correctly Wound/Skin Impairment: Methods: Explain/Verbal Responses: State content correctly Electronic Signature(s) Signed: 05/19/2019 5:57:08 PM By: Cherylin Mylar Entered By: Cherylin Mylar on 05/19/2019 16:57:11 -------------------------------------------------------------------------------- Wound Assessment Details Patient Name: Date of Service: [name], [name] 05/19/2019 2:45 PM Medical Record [id number] Patient Account Number: [id number] Date of Birth/Sex: Treating RN: 14-Jul-1960 (58 y.o. Male) Zenaida Deed Primary Care Neziah Vogelgesang: SYSTEM, PCP Other Clinician: Referring Macguire Holsinger: Treating Fabiola Mudgett/Extender:Robson, Willia Craze, Angela Cox in Treatment: 0 Wound Status Wound Number: 3 Primary Diabetic Wound/Ulcer of the Lower Extremity Etiology: Wound Location:  Left Foot - Lateral Wound Open Wounding Event: Gradually Appeared Status: Date Acquired: 03/05/2019 Comorbid Glaucoma, Coronary Artery Disease, Deep Weeks Of Treatment: 0 History: Vein Thrombosis, Hypertension, Peripheral Clustered Wound: No Arterial Disease, Type II Diabetes, Dementia, Neuropathy, Seizure Disorder Photos Wound Measurements Length: (cm) 2.2 % Reductio Width: (cm)  1.7 % Reductio Depth: (cm) 1.8 Epithelial Area: (cm) 2.937 Tunneling Volume: (cm) 5.287 Undermini Startin Ending Maximum n in Area: 0% n in Volume: 0% ization: None : No ng: Yes g Position (o'clock): 7 Position (o'clock): 4 Distance: (cm) 1.2 Wound Description Classification: Grade 2 Foul Odor Wound Margin: Well defined, not attached Slough/Fi Exudate Amount: Large Exudate Type: Serosanguineous Exudate Color: red, brown Wound Bed Granulation Amount: Small (1-33%) Granulation Quality: Red Fascia Ex Necrotic Amount: Large (67-100%) Fat Layer Necrotic Quality: Adherent Slough Tendon Ex Muscle Ex Necr Joint Exp Bone Expo After Cleansing: No brino Yes Exposed Structure posed: No (Subcutaneous Tissue) Exposed: Yes posed: Yes posed: Yes osis of Muscle: Yes osed: No sed: Yes Electronic Signature(s) Signed: 05/22/2019 5:35:37 PM By: Zenaida Deed RN, BSN Signed: 05/23/2019 4:37:08 PM By: Benjaman Kindler EMT/HBOT Previous Signature: 05/19/2019 6:26:16 PM Version By: Zenaida Deed RN, BSN Entered By: Benjaman Kindler on 05/22/2019 14:34:28 -------------------------------------------------------------------------------- Wound Assessment Details Patient Name: Date of Service: [name] 05/19/2019 2:45 PM Medical Record [id number] Patient Account Number: [id number] Date of Birth/Sex: Treating RN: Dec 03, 1960 (58 y.o. Male) Zenaida Deed Primary Care Megen Madewell: SYSTEM, PCP Other Clinician: Referring Darrin Apodaca: Treating Rosetta Rupnow/Extender:Robson, Willia Craze, Angela Cox in  Treatment: 0 Wound Status Wound Number: 4 Primary Diabetic Wound/Ulcer of the Lower Extremity Etiology: Wound Location: Right Toe Second Wound Open Wounding Event: Gradually Appeared Status: Date Acquired: 05/19/2019 Comorbid Glaucoma, Coronary Artery Disease, Deep Weeks Of Treatment: 0 History: Vein Thrombosis, Hypertension, Peripheral Clustered Wound: No Arterial Disease, Type II Diabetes, Dementia, Neuropathy, Seizure Disorder Photos Wound Measurements Length: (cm) 0.4 % Reduction i Width: (cm) 0.3 % Reduction i Depth: (cm) 0.1 Epithelializa Area: (cm) 0.094 Tunneling: Volume: (cm) 0.009 Undermining: Wound Description Classification: Grade 2 Foul Odor Aft Wound Margin: Distinct, outline attached Slough/Fibrin Exudate Amount: Small Exudate Type: Serosanguineous Exudate Color: red, brown Wound Bed Granulation Amount: Medium (34-66%) Granulation Quality: Red Fascia Expose Fat Layer (Su Tendon Expose Muscle Expose Joint Exposed Bone Exposed: er Cleansing: No o No Exposed Structure d: No bcutaneous Tissue) Exposed: Yes d: No d: No : No Yes n Area: 0% n Volume: 0% tion: None No No Electronic Signature(s) Signed: 05/22/2019 5:35:37 PM By: Zenaida Deed RN, BSN Signed: 05/23/2019 4:37:08 PM By: Benjaman Kindler EMT/HBOT Previous Signature: 05/19/2019 6:26:16 PM Version By: Zenaida Deed RN, BSN Entered By: Benjaman Kindler on 05/22/2019 14:34:03 -------------------------------------------------------------------------------- Vitals Details Patient Name: Date of Service: Frank Fantasia D. 05/19/2019 2:45 PM Medical Record [id number] Patient Account Number: [id number] Date of Birth/Sex: Treating RN: December 13, 1960 (58 y.o. Male) Zenaida Deed Primary Care Jolinda Pinkstaff: SYSTEM, PCP Other Clinician: Referring Temple Sporer: Treating Kayl Stogdill/Extender:Robson, Willia Craze, Angela Cox in Treatment: 0 Vital Signs Time Taken: 15:54 Temperature (F): 98.1 Height  (in): 73 Pulse (bpm): 81 Source: Stated Respiratory Rate (breaths/min): 18 Weight (lbs): 207 Blood Pressure (mmHg): 116/58 Source: Stated Capillary Blood Glucose (mg/dl): 882 Body Mass Index (BMI): 27.3 Reference Range: 80 - 120 mg / dl Notes glucose per facility documentation on 05/15/19 Electronic Signature(s) Signed: 05/19/2019 6:26:16 PM By: Zenaida Deed RN, BSN Entered By: Zenaida Deed on 05/19/2019 15:58:22

## 2019-05-22 ENCOUNTER — Other Ambulatory Visit (HOSPITAL_COMMUNITY)
Admission: RE | Admit: 2019-05-22 | Discharge: 2019-05-22 | Disposition: A | Discharge status: Home / Self Care | Payer: Medicare Other | Source: Other Acute Inpatient Hospital | Attending: Internal Medicine | Admitting: Internal Medicine

## 2019-05-22 DIAGNOSIS — E11621 Type 2 diabetes mellitus with foot ulcer: Secondary | ICD-10-CM | POA: Insufficient documentation

## 2019-05-23 ENCOUNTER — Other Ambulatory Visit: Payer: Self-pay | Admitting: Internal Medicine

## 2019-05-23 ENCOUNTER — Other Ambulatory Visit (HOSPITAL_COMMUNITY): Payer: Self-pay | Admitting: Internal Medicine

## 2019-05-26 ENCOUNTER — Other Ambulatory Visit (HOSPITAL_COMMUNITY): Payer: Self-pay | Admitting: Internal Medicine

## 2019-05-26 ENCOUNTER — Other Ambulatory Visit: Payer: Self-pay | Admitting: Internal Medicine

## 2019-05-26 DIAGNOSIS — I998 Other disorder of circulatory system: Secondary | ICD-10-CM

## 2019-05-26 DIAGNOSIS — M869 Osteomyelitis, unspecified: Secondary | ICD-10-CM

## 2019-05-26 LAB — AEROBIC CULTURE W GRAM STAIN (SUPERFICIAL SPECIMEN)

## 2019-05-26 LAB — AEROBIC CULTURE? (SUPERFICIAL SPECIMEN): Gram Stain: NONE SEEN

## 2019-05-31 ENCOUNTER — Other Ambulatory Visit (HOSPITAL_COMMUNITY): Payer: Self-pay | Admitting: Internal Medicine

## 2019-05-31 DIAGNOSIS — I998 Other disorder of circulatory system: Secondary | ICD-10-CM

## 2019-05-31 DIAGNOSIS — M869 Osteomyelitis, unspecified: Secondary | ICD-10-CM

## 2019-06-01 ENCOUNTER — Other Ambulatory Visit: Payer: Self-pay

## 2019-06-01 ENCOUNTER — Ambulatory Visit (HOSPITAL_COMMUNITY)
Admission: RE | Admit: 2019-06-01 | Discharge: 2019-06-01 | Disposition: A | Discharge status: Home / Self Care | Payer: Medicare Other | Source: Ambulatory Visit | Attending: Internal Medicine | Admitting: Internal Medicine

## 2019-06-01 DIAGNOSIS — M869 Osteomyelitis, unspecified: Secondary | ICD-10-CM | POA: Diagnosis present

## 2019-06-01 DIAGNOSIS — I998 Other disorder of circulatory system: Secondary | ICD-10-CM

## 2019-06-02 ENCOUNTER — Encounter (HOSPITAL_BASED_OUTPATIENT_CLINIC_OR_DEPARTMENT_OTHER): Payer: Medicare Other | Attending: Internal Medicine | Admitting: Internal Medicine

## 2019-06-02 DIAGNOSIS — E11621 Type 2 diabetes mellitus with foot ulcer: Secondary | ICD-10-CM | POA: Diagnosis not present

## 2019-06-02 NOTE — Progress Notes (Signed)
[name], FRIDMAN ([id number]) Visit Report for 06/02/2019 Debridement Details Patient Name: Date of Service: Frank, [name] 06/02/2019 12:30 PM Medical Record [name] Patient Account Number: [id number] Date of Birth/Sex: October 24, 1960 (59 y.o. M) Treating RN: Primary Care Provider: SYSTEM, PCP Other Clinician: Referring Provider: Treating Provider/Extender:Nerida Boivin, Willia Craze, Angela Cox in Treatment: 2 Debridement Performed for Wound #3 Left,Lateral Foot Assessment: Performed By: Physician Maxwell Caul., MD Debridement Type: Debridement Severity of Tissue Pre Fat layer exposed Debridement: Level of Consciousness (Pre- Awake and Alert procedure): Pre-procedure Verification/Time Out Taken: Yes - 13:30 Start Time: 13:30 Pain Control: Other : benzocaine, 20% Total Area Debrided (L x W): 1 (cm) x 1 (cm) = 1 (cm) Tissue and other material Viable, Non-Viable, Slough, Subcutaneous, Slough debrided: Level: Skin/Subcutaneous Tissue Debridement Description: Excisional Instrument: Blade, Forceps Bleeding: Minimum Hemostasis Achieved: Pressure End Time: 13:31 Procedural Pain: 0 Post Procedural Pain: 0 Response to Treatment: Procedure was tolerated well Level of Consciousness Awake and Alert (Post-procedure): Post Debridement Measurements of Total Wound Length: (cm) 2 Width: (cm) 1.9 Depth: (cm) 1.5 Volume: (cm) 4.477 Character of Wound/Ulcer Post Improved Debridement: Severity of Tissue Post Debridement: Fat layer exposed Post Procedure Diagnosis Same as Pre-procedure Electronic Signature(s) Signed: 06/02/2019 5:55:42 PM By: Baltazar Najjar MD Entered By: Baltazar Najjar on 06/02/2019 14:05:29 -------------------------------------------------------------------------------- HPI Details Patient Name: Date of Service: Frank Fantasia D. 06/02/2019 12:30 PM Medical Record [id number] Patient Account Number: [id number] Date of Birth/Sex: Treating  RN: [birth year] (59 y.o. M) Primary Care Provider: SYSTEM, PCP Other Clinician: Referring Provider: Treating Provider/Extender:Zauria Dombek, Willia Craze, Angela Cox in Treatment: 2 History of Present Illness HPI Description: ADMISSION 05/19/2019 [name] is now a 59 year old man with type 2 diabetes. He was actually in this clinic in 2006 in 2008. I do not have these records. More recently I actually looked after him in primary care with [name] up until 2016. Notable for the fact that I treated him for osteomyelitis in the right foot for an MRSA infection in the facility in 2014 or thereabouts. Gave him a prolonged course of vancomycin in the facility. My notes at the time do not list him has having significant PAD. He has severe diabetic neuropathy. The patient apparently has had wounds on his feet since November. This includes the right second toe and the left foot at the mid part of the fifth metatarsal. They are using Santyl on the left foot I am not clear what they are using to the right toe. I do not know if there is been any imaging studies done. It does not appear that his vascular status is been rechecked. 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 The patient is on a combination of Augmentin and Cipro both for 2 weeks. The patient had his arterial studies done at [facility] yesterday I will need to research these results. We are using silver alginate on the left lateral foot and on the right second toe Electronic Signature(s) Signed: 06/02/2019 5:55:42 PM By:  Baltazar Najjar MD Entered By: Baltazar Najjar on 06/02/2019 14:06:29 -------------------------------------------------------------------------------- Physical Exam Details Patient Name: Date of Service: Frank Fantasia D. 06/02/2019 12:30 PM Medical Record [id number] Patient Account Number: [id number] Date of  Birth/Sex: Treating RN: [birth year] (59 y.o. M) Primary Care Provider: Other Clinician: SYSTEM, PCP Referring Provider: Treating Provider/Extender:Marianny Goris, Willia Craze, Angela Cox in Treatment: 2 Constitutional Sitting or standing Blood Pressure is within target range for patient.. Pulse regular and within target range for patient.Marland Kitchen Respirations regular, non-labored and within target range.. Temperature is normal and within the target range for the patient.Marland Kitchen Appears in no distress. Notes Wound exam Right foot over the PIP of the second toe I not back the exposed bone last week and this seems to have allowed for granulation over the wound margin. The major area on the left lateral foot fifth metatarsal shaft is better there is no exposed bone. Still requiring debridement with a #10 scalpel and pickups of necrotic tissue although the wound surface is cleaned up quite nicely Electronic Signature(s) Signed: 06/02/2019 5:55:42 PM By: Baltazar Najjar MD Entered By: Baltazar Najjar on 06/02/2019 14:07:33 -------------------------------------------------------------------------------- Physician Orders Details Patient Name: Date of Service: Frank Fantasia D. 06/02/2019 12:30 PM Medical Record [id number] Patient Account Number: [id number] Date of Birth/Sex: Treating RN: 06-Jun-1960 (59 y.o. [name] Primary Care Provider: SYSTEM, PCP Other Clinician: Referring Provider: Treating Provider/Extender:Zsofia Prout, Willia Craze, Angela Cox in Treatment: 2 Verbal / Phone Orders: No Diagnosis Coding ICD-10 Coding Code Description E11.621 Type 2 diabetes mellitus with  foot ulcer L97.524 Non-pressure chronic ulcer of other part of left foot with necrosis of bone L97.514 Non-pressure chronic ulcer of other part of right foot with necrosis of bone E11.51 Type 2 diabetes mellitus with diabetic peripheral angiopathy without gangrene Follow-up Appointments Return Appointment in 2 weeks. Dressing Change Frequency Change dressing every day. Skin Barriers/Peri-Wound Care Skin Prep Wound Cleansing Clean wound with Wound Cleanser Primary Wound Dressing Wound #3 Left,Lateral Foot Calcium Alginate with Silver Wound #4 Right Toe Second Calcium Alginate with Silver Secondary Dressing Wound #3 Left,Lateral Foot Kerlix/Rolled Gauze Dry Gauze Wound #4 Right Toe Second Kerlix/Rolled Gauze Dry Gauze Electronic Signature(s) Signed: 06/02/2019 5:49:40 PM By: Cherylin Mylar Signed: 06/02/2019 5:55:42 PM By: Baltazar Najjar MD Entered By: Cherylin Mylar on 06/02/2019 12:52:41 -------------------------------------------------------------------------------- Problem List Details Patient Name: Date of Service: Frank Fantasia D. 06/02/2019 12:30 PM Medical Record [id number] Patient Account Number: [id number] Date of Birth/Sex: Treating RN: 1960/06/21 (59 y.o. [name] Primary Care Provider: SYSTEM, PCP Other Clinician: Referring Provider: Treating Provider/Extender:Abdi Husak, Willia Craze, Angela Cox in Treatment: 2 Active Problems ICD-10 Evaluated Encounter Code Description Active Date Today Diagnosis E11.621 Type 2 diabetes mellitus with foot ulcer 05/19/2019 No Yes L97.524 Non-pressure chronic ulcer of other part of left foot 05/19/2019 No Yes with necrosis of bone L97.514 Non-pressure chronic ulcer of other part of right foot 05/19/2019 No Yes with necrosis of bone E11.51 Type 2 diabetes mellitus with diabetic peripheral 05/19/2019 No Yes angiopathy without gangrene Inactive Problems Resolved Problems Electronic Signature(s) Signed:  06/02/2019 5:55:42 PM By: Baltazar Najjar MD Entered By: Baltazar Najjar on 06/02/2019 14:05:00 -------------------------------------------------------------------------------- Progress Note Details Patient Name: Date of Service: Frank Fantasia D. 06/02/2019 12:30 PM Medical Record [id number] Patient Account Number: [id number] Date of Birth/Sex: Treating RN: 20-Sep-1960 (59 y.o. M) Primary Care Provider: SYSTEM, PCP Other Clinician: Referring Provider: Treating Provider/Extender:Ashna Dorough, Willia Craze, Angela Cox in Treatment: 2 Subjective History of Present Illness (HPI) ADMISSION 05/19/2019 Mr. Borgmeyer is now a 59 year old man with type 2 diabetes. He was actually in this clinic in 2006 in 2008. I do not have these records. More recently I actually looked after him in primary care with [facility] up until 2016. Notable for  the fact that I treated him for osteomyelitis in the right foot for an MRSA infection in the facility in 2014 or thereabouts. Gave him a prolonged course of vancomycin in the facility. My notes at the time do not list him has having significant PAD. He has severe diabetic neuropathy. The patient apparently has had wounds on his feet since November. This includes the right second toe and the left foot at the mid part of the fifth metatarsal. They are using Santyl on the left foot I am not clear what they are using to the right toe. I do not know if there is been any imaging studies done. It does not appear that his vascular status is been rechecked. 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 The patient is on a combination of Augmentin and Cipro both for 2 weeks. The patient had his arterial studies done at [facility] yesterday I will need to research these results. We are using silver alginate on the left lateral foot and on the right second toe Objective Constitutional Sitting or standing Blood Pressure is within target range for patient.. Pulse regular and within target range for patient.Marland Kitchen Respirations regular, non-labored and within target range.. Temperature is normal and within the target range for the patient.Marland Kitchen Appears in no distress. Vitals Time Taken: 12:51 PM, Height: 73 in, Weight: 207 lbs, BMI: 27.3, Temperature: 99 F, Pulse: 81 bpm, Respiratory Rate: 18 breaths/min, Blood Pressure: 120/52 mmHg. General Notes: Wound exam ooRight foot over the PIP of the second toe I not back the exposed bone last week and this seems to have allowed for granulation over the wound margin. ooThe major area on the left lateral foot fifth metatarsal shaft is better there is no exposed bone. Still requiring debridement with a #10 scalpel and pickups of necrotic tissue although the wound surface is cleaned up quite nicely Integumentary (Hair, Skin) Wound #3 status is Open. Original cause of wound was Gradually Appeared. The wound is located on the Left,Lateral Foot. The wound measures 2cm length x 1.9cm width x 1.5cm depth; 2.985cm^2 area and 4.477cm^3 volume. There is bone, muscle, tendon, and Fat Layer (Subcutaneous Tissue) Exposed exposed. There is no tunneling noted, however, there is undermining starting at 12:00 and ending at 12:00 with a maximum distance of 1.8cm. There is a large amount of serosanguineous drainage noted. The wound margin is epibole. There is small (1- 33%) red granulation within the wound bed. There is a large (67-100%) amount of necrotic tissue within the wound bed including Adherent Slough and Necrosis of  Muscle. Wound #4 status is Open. Original cause of wound was Gradually Appeared. The wound is located on the Right Toe Second. The wound measures 0.6cm length x 0.5cm width x 0.1cm depth; 0.236cm^2 area and 0.024cm^3 volume. There is bone and Fat Layer (Subcutaneous Tissue) Exposed exposed. There is no tunneling or undermining noted. There is a medium amount of serosanguineous drainage noted. The wound margin is distinct with the outline attached to the wound base. There is large (67-100%) red, hyper - granulation within the wound bed. There is a small (1-33%) amount of necrotic tissue within the wound bed including Adherent Slough. 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 There was a Excisional Skin/Subcutaneous Tissue Debridement with a total area of 1 sq cm performed by Maxwell Caul., MD. With the following instrument(s): Blade, and Forceps to remove Viable and Non-Viable tissue/material. Material removed includes Subcutaneous Tissue and Slough and after achieving pain control using Other (benzocaine, 20%). No specimens were taken. A time out was conducted at 13:30, prior to the start of the procedure. A Minimum amount of bleeding was controlled with Pressure. The procedure was tolerated well with a pain level of 0 throughout and a pain level of 0 following the procedure. Post Debridement Measurements: 2cm length x 1.9cm width x 1.5cm depth; 4.477cm^3 volume. Character of Wound/Ulcer Post Debridement is improved. Severity of Tissue  Post Debridement is: Fat layer exposed. Post procedure Diagnosis Wound #3: Same as Pre-Procedure Plan Follow-up Appointments: Return Appointment in 2 weeks. Dressing Change Frequency: Change dressing every day. Skin Barriers/Peri-Wound Care: Skin Prep Wound Cleansing: Clean wound with Wound Cleanser Primary Wound Dressing: Wound #3 Left,Lateral Foot: Calcium Alginate with Silver Wound #4 Right Toe Second: Calcium Alginate with Silver Secondary Dressing: Wound #3 Left,Lateral Foot: Kerlix/Rolled Gauze Dry Gauze Wound #4 Right Toe Second: Kerlix/Rolled Gauze Dry Gauze #1 continue with silver alginate to the left lateral foot and the right second toe. 2. Both wounds seem quite a bit better on Augmentin and Cipro. 3. The bone biopsy for pathology that I did last week did not show osteomyelitis but it did culture. For now I am going to see how the wound does before I consider additional imaging studies the such as an MRI 4. The patient had his vascular studies yesterday we will need to look at these results. These were done at [facility]. Electronic Signature(s) Signed: 06/02/2019 5:55:42 PM By: Baltazar Najjar MD Entered By: Baltazar Najjar on 06/02/2019 14:09:34 -------------------------------------------------------------------------------- SuperBill Details Patient Name: Date of Service: [name] 06/02/2019 Medical Record [id number] Patient Account Number: [id number] Date of Birth/Sex: Treating RN: 1960/06/04 (59 y.o. [name] Primary Care Provider: SYSTEM, PCP Other Clinician: Referring Provider: Treating Provider/Extender:Shahzad Thomann, Willia Craze, Angela Cox in Treatment: 2 Diagnosis Coding ICD-10 Codes Code Description E11.621 Type 2 diabetes mellitus with foot ulcer L97.524 Non-pressure chronic ulcer of other part of left foot with necrosis of bone L97.514 Non-pressure chronic ulcer of other part of right foot with necrosis of  bone E11.51 Type 2 diabetes mellitus with diabetic peripheral angiopathy without gangrene Facility Procedures CPT4 Code Description: [id number] 11042 - DEB SUBQ TISSUE 20 SQ CM/< ICD-10 Diagnosis Description L97.524 Non-pressure chronic ulcer of other part of left foot wi Modifier: th necrosis of Quantity: 1 bone Physician Procedures CPT4 Code Description: [id number] 11042 - WC PHYS SUBQ TISS 20 SQ CM ICD-10 Diagnosis Description L97.524 Non-pressure chronic ulcer of other part of left foot wi Modifier: th necrosis of Quantity: 1 bone Electronic Signature(s) Signed: 06/02/2019 5:55:42 PM By: Baltazar Najjar MD Entered By: Baltazar Najjar on 06/02/2019 14:09:46

## 2019-06-02 NOTE — Progress Notes (Addendum)
REHMAN, LEVINSON ([id number]) Visit Report for 06/02/2019 Arrival Information Details Patient Name: Date of Service: NOBEL, BRAR 06/02/2019 12:30 PM Medical Record [id number] Patient Account Number: [id number] Date of Birth/Sex: Treating RN: June 06, 1960 (59 y.o. [name] Primary Care Namine Beahm: SYSTEM, PCP Other Clinician: Referring Al Bracewell: Treating Avi Archuleta/Extender:Robson, Willia Craze, Angela Cox in Treatment: 2 Visit Information History Since Last Visit All ordered tests and consults were completed: Yes Patient Arrived: Wheel Chair Added or deleted any medications: Yes Arrival Time: 12:50 Any new allergies or adverse reactions: No Accompanied By: caregiver Had a fall or experienced change in No Transfer Assistance: Other activities of daily living that may affect Patient Identification Verified: Yes risk of falls: Secondary Verification Process Yes Signs or symptoms of abuse/neglect since last No Completed: visito Patient Requires Transmission-Based No Hospitalized since last visit: No Precautions: Implantable device outside of the clinic excluding No Patient Has Alerts: Yes cellular tissue based products placed in the center Patient Alerts: R and L ABIs since last visit: N/C Has Dressing in Place as Prescribed: Yes Pain Present Now: No Notes stays in chair. Electronic Signature(s) Signed: 06/02/2019 6:00:37 PM By: Shawn Stall Entered By: Shawn Stall on 06/02/2019 13:04:23 -------------------------------------------------------------------------------- Encounter Discharge Information Details Patient Name: Date of Service: [name] 06/02/2019 12:30 PM Medical Record [id number] Patient Account Number: [id number] Date of Birth/Sex: Treating RN: 1961-04-25 (59 y.o. [name] Primary Care Gabby Rackers: SYSTEM, PCP Other Clinician: Referring Jashanti Clinkscale: Treating Maniah Nading/Extender:Robson, Willia Craze, Angela Cox in  Treatment: 2 Encounter Discharge Information Items Post Procedure Vitals Discharge Condition: Stable Temperature (F): 99 Ambulatory Status: Wheelchair Pulse (bpm): 81 Discharge Destination: Skilled Nursing Facility Respiratory Rate (breaths/min): 18 Telephoned: No Blood Pressure (mmHg): 120/52 Orders Sent: Yes Transportation: Private Auto Accompanied By: caregiver Schedule Follow-up Appointment: Yes Clinical Summary of Care: Electronic Signature(s) Signed: 06/02/2019 6:00:37 PM By: Shawn Stall Entered By: Shawn Stall on 06/02/2019 13:35:35 -------------------------------------------------------------------------------- Lower Extremity Assessment Details Patient Name: Date of Service: DAVEN, MONTZ 06/02/2019 12:30 PM Medical Record [id number] Patient Account Number: [id number] Date of Birth/Sex: Treating RN: [birth year] (59 y.o. [name] Primary Care Demonie Kassa: SYSTEM, PCP Other Clinician: Referring Marquese Burkland: Treating Jamileth Putzier/Extender:Robson, Willia Craze, Angela Cox in Treatment: 2 Edema Assessment Assessed: [Left: Yes] [Right: Yes] Edema: [Left: No] [Right: No] Calf Left: Right: Point of Measurement: cm From Medial Instep 29 cm 28 cm Ankle Left: Right: Point of Measurement: cm From Medial Instep 20 cm 19 cm Electronic Signature(s) Signed: 06/02/2019 6:00:37 PM By: Shawn Stall Entered By: Shawn Stall on 06/02/2019 13:05:02 -------------------------------------------------------------------------------- Multi Wound Chart Details Patient Name: Date of Service: [name] 06/02/2019 12:30 PM Medical Record [id number] Patient Account Number: [id number] Date of Birth/Sex: Treating RN: 07-25-1960 (59 y.o. 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] [4:Lower Extremity] Comorbid History: [3:Glaucoma, Coronary Artery Glaucoma, Coronary Artery N/A Disease, Deep Vein Thrombosis, Hypertension, Thrombosis, Hypertension, Peripheral Arterial Disease, Peripheral Arterial Disease, Type II Diabetes, Dementia, Type II Diabetes,  Dementia, Neuropathy, Seizure Disorder] [4:Disease, Deep Vein Neuropathy, Seizure Disorder] Date Acquired: [3:03/05/2019] [4:05/19/2019] [N/A:N/A] Weeks of Treatment: [3:2] [4:2] [N/A:N/A] Wound Status: [3:Open] [4:Open] [N/A:N/A] Measurements L  x W x D 2x1.9x1.5 [4:0.6x0.5x0.1] [N/A:N/A] (cm) Area (cm) : [3:2.985] [4:0.236] [N/A:N/A] Volume (cm) : [3:4.477] [4:0.024] [N/A:N/A] % Reduction in Area: [3:-1.60%] [4:-151.10%] [N/A:N/A] % Reduction in Volume: 15.30% [4:-166.70%] [N/A:N/A] Starting Position 1 12 (o'clock): Ending Position 1 [3:12] (o'clock): Maximum Distance 1 [3:1.8] (cm): Undermining: [3:Yes] [4:No] [N/A:N/A] Classification: [3:Grade 2] [4:Grade 2] [N/A:N/A] Exudate Amount: [3:Large] [4:Medium] [N/A:N/A] Exudate Type: [3:Serosanguineous] [4:Serosanguineous] [N/A:N/A] Exudate Color: [3:red, brown] [4:red, brown] [N/A:N/A] Wound Margin: [3:Epibole] [4:Distinct, outline attached N/A] Granulation Amount: [3:Small (1-33%)] [4:Large (67-100%)] [N/A:N/A] Granulation Quality: [3:Red] [4:Red, Hyper-granulation] [N/A:N/A] Necrotic Amount: [3:Large (67-100%)] [4:Small (1-33%)] [N/A:N/A] Exposed Structures: [3:Fat Layer (Subcutaneous Fat Layer (Subcutaneous N/A Tissue) Exposed: Yes Tendon: Yes Muscle: Yes Bone: Yes Fascia: No  Joint: No] [4:Tissue) Exposed: Yes Bone: Yes Fascia: No Tendon: No Muscle: No Joint: No] Epithelialization: [3:None] [4:None] [N/A:N/A] Debridement: [3:Debridement - Excisional N/A] [N/A:N/A] Pre-procedure [3:13:30] [4:N/A] [N/A:N/A] Verification/Time Out Taken: Pain Control: [3:Other] [4:N/A] [N/A:N/A] Tissue Debrided: [3:Subcutaneous, Slough] [4:N/A] [N/A:N/A] Level: [3:Skin/Subcutaneous Tissue] [4:N/A] [N/A:N/A] Debridement Area (sq cm):1 [4:N/A] [N/A:N/A] Instrument: [3:Blade, Forceps] [4:N/A] [N/A:N/A] Bleeding: [3:Minimum] [4:N/A] [N/A:N/A] Hemostasis Achieved: [3:Pressure] [4:N/A] [N/A:N/A] Procedural Pain: [3:0] [4:N/A] [N/A:N/A] Post Procedural Pain: [3:0] [4:N/A] [N/A:N/A] Debridement Treatment Procedure was tolerated [4:N/A] [N/A:N/A] Response: [3:well] Post Debridement [3:2x1.9x1.5] [4:N/A] [N/A:N/A] Measurements L x W x D (cm) Post Debridement [3:4.477] [4:N/A] [N/A:N/A] Volume: (cm) Procedures Performed: Debridement [4:N/A] [N/A:N/A] Treatment Notes Wound #3 (Left, Lateral Foot) 1. Cleanse With Wound Cleanser 3. Primary Dressing Applied Calcium Alginate Ag 4. Secondary Dressing Dry Gauze Roll Gauze 5. Secured With Medipore tape Notes bunny boot to Bilateral feet. Wound #4 (Right Toe Second) 1. Cleanse With Wound Cleanser 3. Primary Dressing Applied Calcium Alginate Ag 4. Secondary Dressing Dry Gauze Roll Gauze 5. Secured With Medipore tape Notes bunny boot to Bilateral feet. Electronic Signature(s) Signed: 06/02/2019 5:55:42 PM By: Baltazar Najjar MD Entered By: Baltazar Najjar on 06/02/2019 14:05:19 -------------------------------------------------------------------------------- Multi-Disciplinary Care Plan Details Patient Name: Date of Service: [name] 06/02/2019 12:30 PM Medical Record [id number] Patient Account Number: [id number] Date of Birth/Sex: Treating RN: 09-21-1960 (59 y.o. [name] Primary Care Galena Logie:  SYSTEM, PCP Other Clinician: Referring Yona Kosek: Treating Cintya Daughety/Extender:Robson, Willia Craze, Angela Cox in Treatment: 2 Active Inactive Nutrition Nursing Diagnoses: Imbalanced nutrition Goals: Patient/caregiver will maintain therapeutic glucose control Date Initiated: 05/19/2019 Target Resolution Date: 06/09/2019 Goal Status: Active Interventions: Provide education on elevated blood sugars and impact on wound healing Notes: Wound/Skin Impairment Nursing Diagnoses: Impaired tissue integrity Goals: Ulcer/skin breakdown will have a volume reduction of 50% by week 8 Date Initiated: 05/19/2019 Target Resolution Date: 06/09/2019 Goal Status: Active Interventions: Provide education on ulcer and skin care Notes: Electronic Signature(s) Signed: 06/02/2019 5:49:40 PM By: Cherylin Mylar Entered By: Cherylin Mylar on 06/02/2019 12:52:51 -------------------------------------------------------------------------------- Pain Assessment Details Patient Name: Date of Service: AYINDE, SWIM 06/02/2019 12:30 PM Medical Record [id number] Patient Account Number: [id number] Date of Birth/Sex: Treating RN: 05/13/1960 (59 y.o. [name] Primary Care Darrielle Pflieger: SYSTEM, PCP Other Clinician: Referring Laresa Oshiro: Treating Janard Culp/Extender:Robson, Willia Craze, Angela Cox in Treatment: 2 Active Problems Location of Pain Severity and Description of Pain Patient Has Paino No Site Locations Rate the pain. Current Pain Level: 0 Pain Management and Medication Current Pain Management: Medication: No Cold Application: No Rest: No Massage: No Activity: No T.E.N.S.: No Heat Application: No Leg drop or elevation: No Is the Current Pain Management Adequate: Adequate How does your wound impact your activities of daily livingo Sleep: No Bathing: No Appetite: No Relationship With Others: No Bladder  Continence: No Emotions: No Bowel Continence: No Work: No Toileting:  No Drive: No Dressing: No Hobbies: No Electronic Signature(s) Signed: 06/02/2019 6:00:37 PM By: Shawn Stall Entered By: Shawn Stall on 06/02/2019 13:04:47 -------------------------------------------------------------------------------- Patient/Caregiver Education Details Patient Name: Date of Service: [name] 1/29/2021andnbsp12:30 PM Medical Record [id number] Patient Account Number: [id number] Date of Birth/Gender: 1960-08-31 (58 y.o. M) Treating RN: Cherylin Mylar Primary Care Physician: SYSTEM, PCP Other Clinician: Referring Physician: Treating Physician/Extender:Robson, Willia Craze, Angela Cox in Treatment: 2 Education Assessment Education Provided To: Patient Education Topics Provided Elevated Blood Sugar/ Impact on Healing: Methods: Explain/Verbal Responses: State content correctly Wound/Skin Impairment: Methods: Explain/Verbal Responses: State content correctly Electronic Signature(s) Signed: 06/02/2019 5:49:40 PM By: Cherylin Mylar Entered By: Cherylin Mylar on 06/02/2019 12:53:09 -------------------------------------------------------------------------------- Wound Assessment Details Patient Name: Date of Service: MARICE, ANGELINO 06/02/2019 12:30 PM Medical Record [id number] Patient Account Number: [id number] Date of Birth/Sex: Treating RN: 1960/07/07 (59 y.o. M) Primary Care Caine Barfield: SYSTEM, PCP Other Clinician: Referring Tayla Panozzo: Treating Anjannette Gauger/Extender:Robson, Willia Craze, Angela Cox in Treatment: 2 Wound Status Wound Number: 3 Primary Diabetic Wound/Ulcer of the Lower Extremity Etiology: Wound Location: Left Foot - Lateral Wound Open Wounding Event: Gradually Appeared Status: Date Acquired: 03/05/2019 Comorbid Glaucoma, Coronary Artery Disease, Deep Weeks Of Treatment: 2 History: Vein Thrombosis, Hypertension, Peripheral Clustered Wound: No Arterial Disease, Type II Diabetes, Dementia, Neuropathy, Seizure  Disorder Photos Wound Measurements Length: (cm) 2 Width: (cm) 1.9 Depth: (cm) 1.5 Area: (cm) 2.985 Volume: (cm) 4.477 % Reduction in Area: -1.6% % Reduction in Volume: 15.3% Epithelialization: None Tunneling: No Undermining: Yes Starting Position (o'clock): 12 Ending Position (o'clock): 12 Maximum Distance: (cm) 1.8 Wound Description Classification: Grade 2 Wound Margin: Epibole Exudate Amount: Large Exudate Type: Serosanguineous Exudate Color: red, brown Wound Bed Granulation Amount: Small (1-33%) Granulation Quality: Red Necrotic Amount: Large (67-100%) Necrotic Quality: Adherent Slough Foul Odor After Cleansing: No Slough/Fibrino Yes Exposed Structure Fascia Exposed: No Fat Layer (Subcutaneous Tissue) Exposed: Yes Tendon Exposed: Yes Muscle Exposed: Yes Necrosis of Muscle: Yes Joint Exposed: No Bone Exposed: Yes Treatment Notes Wound #3 (Left, Lateral Foot) 1. Cleanse With Wound Cleanser 3. Primary Dressing Applied Calcium Alginate Ag 4. Secondary Dressing Dry Gauze Roll Gauze 5. Secured With Medipore tape Notes bunny boot to Bilateral feet. Electronic Signature(s) Signed: 06/06/2019 5:19:10 PM By: Benjaman Kindler EMT/HBOT Previous Signature: 06/02/2019 6:00:37 PM Version By: Shawn Stall Entered By: Benjaman Kindler on 06/06/2019 14:30:02 -------------------------------------------------------------------------------- Wound Assessment Details Patient Name: Date of Service: ALLYN, BERTONI 06/02/2019 12:30 PM Medical Record [id number] Patient Account Number: [id number] Date of Birth/Sex: Treating RN: 1961/03/18 (59 y.o. M) Primary Care Jaivon Vanbeek: SYSTEM, PCP Other Clinician: Referring Amirrah Quigley: Treating Jameia Makris/Extender:Robson, Willia Craze, Angela Cox in Treatment: 2 Wound Status Wound Number: 4 Primary Diabetic Wound/Ulcer of the Lower Extremity Etiology: Wound Location: Right Toe Second Wound Open Wounding Event: Gradually  Appeared Status: Date Acquired: 05/19/2019 Comorbid Glaucoma, Coronary Artery Disease, Deep Weeks Of Treatment: 2 History: Vein Thrombosis, Hypertension, Peripheral Clustered Wound: No Arterial Disease, Type II Diabetes, Dementia, Neuropathy, Seizure Disorder Photos Wound Measurements Length: (cm) 0.6 % Reduction i Width: (cm) 0.5 % Reduction i Depth: (cm) 0.1 Epithelializa Area: (cm) 0.236 Tunneling: Volume: (cm) 0.024 Undermining: Wound Description Classification: Grade 2 Foul Odor Af Wound Margin: Distinct, outline attached Slough/Fibri Exudate Amount: Medium Exudate Type: Serosanguineous Exudate Color: red, brown Wound Bed Granulation Amount: Large (67-100%) Granulation Quality: Red, Hyper-granulation Fascia Expos Necrotic Amount: Small (1-33%) Fat Layer (S Necrotic Quality: Adherent Slough Tendon Expos Muscle Expos Joint Expose Bone Exposed  ter Cleansing: No no Yes Exposed Structure ed: No ubcutaneous Tissue) Exposed: Yes ed: No ed: No d: No : Yes n Area: -151.1% n Volume: -166.7% tion: None No No Treatment Notes Wound #4 (Right Toe Second) 1. Cleanse With Wound Cleanser 3. Primary Dressing Applied Calcium Alginate Ag 4. Secondary Dressing Dry Gauze Roll Gauze 5. Secured With Medipore tape Notes bunny boot to Bilateral feet. Electronic Signature(s) Signed: 06/06/2019 5:19:10 PM By: Mikeal Hawthorne EMT/HBOT Previous Signature: 06/02/2019 6:00:37 PM Version By: Deon Pilling Entered By: Mikeal Hawthorne on 06/06/2019 14:29:32 -------------------------------------------------------------------------------- Vitals Details Patient Name: Date of Service: Curt Bears D. 06/02/2019 12:30 PM Medical Record [id number] Patient Account Number: [id number] Date of Birth/Sex: Treating RN: Jan 28, 1961 (59 y.o. Hessie Diener Primary Care Charley Lafrance: SYSTEM, PCP Other Clinician: Referring Shreeya Recendiz: Treating Macarius Ruark/Extender:Robson, Ileene Rubens,  Ermalene Searing in Treatment: 2 Vital Signs Time Taken: 12:51 Temperature (F): 99 Height (in): 73 Pulse (bpm): 81 Weight (lbs): 207 Respiratory Rate (breaths/min): 18 Body Mass Index (BMI): 27.3 Blood Pressure (mmHg): 120/52 Reference Range: 80 - 120 mg / dl Electronic Signature(s) Signed: 06/02/2019 6:00:37 PM By: Deon Pilling Entered By: Deon Pilling on 06/02/2019 13:51:29

## 2019-06-16 ENCOUNTER — Other Ambulatory Visit: Payer: Self-pay

## 2019-06-16 ENCOUNTER — Encounter (HOSPITAL_BASED_OUTPATIENT_CLINIC_OR_DEPARTMENT_OTHER): Payer: Medicare Other | Attending: Internal Medicine | Admitting: Internal Medicine

## 2019-06-16 DIAGNOSIS — E1140 Type 2 diabetes mellitus with diabetic neuropathy, unspecified: Secondary | ICD-10-CM | POA: Insufficient documentation

## 2019-06-16 DIAGNOSIS — L97516 Non-pressure chronic ulcer of other part of right foot with bone involvement without evidence of necrosis: Secondary | ICD-10-CM | POA: Insufficient documentation

## 2019-06-16 DIAGNOSIS — I10 Essential (primary) hypertension: Secondary | ICD-10-CM | POA: Insufficient documentation

## 2019-06-16 DIAGNOSIS — Z794 Long term (current) use of insulin: Secondary | ICD-10-CM | POA: Insufficient documentation

## 2019-06-16 DIAGNOSIS — E11621 Type 2 diabetes mellitus with foot ulcer: Secondary | ICD-10-CM | POA: Insufficient documentation

## 2019-06-16 DIAGNOSIS — L97526 Non-pressure chronic ulcer of other part of left foot with bone involvement without evidence of necrosis: Secondary | ICD-10-CM | POA: Diagnosis not present

## 2019-06-19 NOTE — Progress Notes (Signed)
IRL, BODIE ([id number]) Visit Report for 06/16/2019 Arrival Information Details Patient Name: Date of Service: [name], [name] 06/16/2019 12:45 PM Medical Record [id number] Patient Account Number: [id number] Date of Birth/Sex: Treating RN: [birth year] (58 y.o. Judie Petit) Yevonne Pax Primary Care Chrisha Vogel: SYSTEM, PCP Other Clinician: Referring Doll Frazee: Treating Lamount Bankson/Extender:Robson, Willia Craze, Angela Cox in Treatment: 4 Visit Information History Since Last Visit All ordered tests and consults were completed: No Patient Arrived: Wheel Chair Added or deleted any medications: No Arrival Time: 12:56 Any new allergies or adverse reactions: No Accompanied By: caregiver Had a fall or experienced change in No Transfer Assistance: None activities of daily living that may affect Patient Identification Verified: Yes risk of falls: Secondary Verification Process Yes Signs or symptoms of abuse/neglect since last No Completed: visito Patient Requires Transmission-Based No Hospitalized since last visit: No Precautions: Implantable device outside of the clinic excluding No Patient Has Alerts: Yes cellular tissue based products placed in the center Patient Alerts: R and L ABIs since last visit: N/C Has Dressing in Place as Prescribed: Yes Pain Present Now: No Electronic Signature(s) Signed: 06/16/2019 5:18:45 PM By: Yevonne Pax RN Entered By: Yevonne Pax on 06/16/2019 12:57:09 -------------------------------------------------------------------------------- Clinic Level of Care Assessment Details Patient Name: Date of Service: [name], [name] 06/16/2019 12:45 PM Medical Record [id number] Patient Account Number: [id number] Date of Birth/Sex: Treating RN: 08-Aug-1960 (59 y.o. [name] Primary Care Jericho Alcorn: SYSTEM, PCP Other Clinician: Referring Kassiah Mccrory: Treating Raizy Auzenne/Extender:Robson, Willia Craze, Angela Cox in Treatment: 4 Clinic Level of  Care Assessment Items TOOL 4 Quantity Score X - Use when only an EandM is performed on FOLLOW-UP visit 1 0 ASSESSMENTS - Nursing Assessment / Reassessment X - Reassessment of Co-morbidities (includes updates in patient status) 1 10 X - Reassessment of Adherence to Treatment Plan 1 5 ASSESSMENTS - Wound and Skin Assessment / Reassessment []  - Simple Wound Assessment / Reassessment - one wound 0 X - Complex Wound Assessment / Reassessment - multiple wounds 2 5 []  - Dermatologic / Skin Assessment (not related to wound area) 0 ASSESSMENTS - Focused Assessment X - Circumferential Edema Measurements - multi extremities 1 5 []  - Nutritional Assessment / Counseling / Intervention 0 []  - Lower Extremity Assessment (monofilament, tuning fork, pulses) 0 []  - Peripheral Arterial Disease Assessment (using hand held doppler) 0 ASSESSMENTS - Ostomy and/or Continence Assessment and Care []  - Incontinence Assessment and Management 0 []  - Ostomy Care Assessment and Management (repouching, etc.) 0 PROCESS - Coordination of Care X - Simple Patient / Family Education for ongoing care 1 15 []  - Complex (extensive) Patient / Family Education for ongoing care 0 X - Staff obtains , Records, Test Results / Process Orders 1 10 X - Staff telephones HHA, Nursing Homes / Clarify orders / etc 1 10 []  - Routine Transfer to another Facility (non-emergent condition) 0 []  - Routine Hospital Admission (non-emergent condition) 0 []  - New Admissions / / Ordering NPWT, Apligraf, etc. 0 []  - Emergency Hospital Admission (emergent condition) 0 X - Simple Discharge Coordination 1 10 []  - Complex (extensive) Discharge Coordination 0 PROCESS - Special Needs []  - Pediatric / Minor Patient Management 0 []  - Isolation Patient Management 0 []  - Hearing / Language / Visual special needs 0 []  - Assessment of Community assistance (transportation, D/C planning, etc.) 0 []  - Additional assistance /  Altered mentation 0 []  - Support Surface(s) Assessment (bed, cushion, seat, etc.) 0 INTERVENTIONS - Wound Cleansing / Measurement []  - Simple Wound Cleansing -  one wound 0 X - Complex Wound Cleansing - multiple wounds 2 5 X - Wound Imaging (photographs - any number of wounds) 1 5 []  - Wound Tracing (instead of photographs) 0 X - Simple Wound Measurement - one wound 1 5 []  - Complex Wound Measurement - multiple wounds 0 INTERVENTIONS - Wound Dressings X - Small Wound Dressing one or multiple wounds 2 10 []  - Medium Wound Dressing one or multiple wounds 0 []  - Large Wound Dressing one or multiple wounds 0 X - Application of Medications - topical 1 5 []  - Application of Medications - injection 0 INTERVENTIONS - Miscellaneous []  - External ear exam 0 []  - Specimen Collection (cultures, biopsies, blood, body fluids, etc.) 0 []  - Specimen(s) / Culture(s) sent or taken to Lab for analysis 0 []  - Patient Transfer (multiple staff / Civil Service fast streamer / Similar devices) 0 []  - Simple Staple / Suture removal (25 or less) 0 []  - Complex Staple / Suture removal (26 or more) 0 []  - Hypo / Hyperglycemic Management (close monitor of Blood Glucose) 0 []  - Ankle / Brachial Index (ABI) - do not check if billed separately 0 X - Vital Signs 1 5 Has the patient been seen at the hospital within the last three years: Yes Total Score: 125 Level Of Care: New/Established - Level 4 Electronic Signature(s) Signed: 06/19/2019 6:12:41 PM By: Kela Millin Entered By: Kela Millin on 06/16/2019 13:16:09 -------------------------------------------------------------------------------- Lower Extremity Assessment Details Patient Name: Date of Service: [name], [name] 06/16/2019 12:45 PM Medical Record [id number] Patient Account Number: [id number] Date of Birth/Sex: Treating RN: 12-Nov-1960 (58 y.o. Jerilynn Mages) Carlene Coria Primary Care Johnhenry Tippin: SYSTEM, PCP Other Clinician: Referring Rilan Eiland: Treating  Okie Jansson/Extender:Robson, Ileene Rubens, Ermalene Searing in Treatment: 4 Edema Assessment Assessed: [Left: No] [Right: No] Edema: [Left: No] [Right: No] Calf Left: Right: Point of Measurement: cm From Medial Instep 29 cm 28 cm Ankle Left: Right: Point of Measurement: cm From Medial Instep 20 cm 19 cm Electronic Signature(s) Signed: 06/16/2019 5:18:45 PM By: Carlene Coria RN Entered By: Carlene Coria on 06/16/2019 12:58:59 -------------------------------------------------------------------------------- Multi Wound Chart Details Patient Name: Date of Service: Frank Bears D. 06/16/2019 12:45 PM Medical Record [id number] Patient Account Number: [id number] Date of Birth/Sex: Treating RN: 09-Apr-1961 (59 y.o. 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047] [N/A:N/A] Volume (cm) : [3:0.24] [4:0.005] [N/A:N/A] % Reduction in Area: [3:18.20%] [4:50.00%] [N/A:N/A] % Reduction in Volume: 95.50% [4:44.40%] [N/A:N/A] Classification: [3:Grade 2] [4:Grade 2] [N/A:N/A] Exudate Amount: [3:Large] [4:Medium] [N/A:N/A] Exudate Type: [3:Serosanguineous] [4:Serosanguineous] [N/A:N/A] Exudate Color: [3:red, brown] [4:red, brown] [N/A:N/A] Wound Margin: [3:Epibole] [4:Distinct, outline attached N/A] Granulation Amount: [3:Small (1-33%)] [4:Large (67-100%)] [N/A:N/A] Granulation Quality: [3:Red] [4:Red, Hyper-granulation] [  N/A:N/A] Necrotic Amount: [3:Large (67-100%)] [4:Small (1-33%)] [N/A:N/A] Exposed Structures: [3:Fat Layer (Subcutaneous Fat Layer (Subcutaneous N/A Tissue) Exposed: Yes Tendon: Yes Muscle: Yes Bone: Yes Fascia: No Joint: No None] [4:Tissue) Exposed: Yes Bone: Yes Fascia: No Tendon: No Muscle: No Joint: No None] [N/A:N/A] Treatment Notes Electronic Signature(s) Signed: 06/16/2019 5:45:04 PM By: Baltazar Najjar MD Signed: 06/19/2019 6:12:41 PM By: Cherylin Mylar Entered By: Baltazar [name] on 06/16/2019 14:34:39 -------------------------------------------------------------------------------- Multi-Disciplinary Care Plan Details Patient Name: Date of Service: Frank Fantasia D. 06/16/2019 12:45 PM Medical Record [id number] Patient Account Number: [id number] Date of Birth/Sex: Treating RN: [birth year] (59 y.o. [name] Primary Care Daymion Nazaire: SYSTEM, PCP Other Clinician: Referring Nevaya Nagele: Treating Shanley Furlough/Extender:Robson, Willia Craze, Angela Cox in Treatment: 4 Active Inactive Nutrition Nursing Diagnoses: Imbalanced nutrition Goals: Patient/caregiver will maintain therapeutic glucose control Date Initiated: 05/19/2019 Target Resolution Date: 06/09/2019 Goal Status: Active Interventions: Provide education on elevated blood sugars and impact on wound  healing Notes: Wound/Skin Impairment Nursing Diagnoses: Impaired tissue integrity Goals: Ulcer/skin breakdown will have a volume reduction of 50% by week 8 Date Initiated: 05/19/2019 Target Resolution Date: 06/09/2019 Goal Status: Active Interventions: Provide education on ulcer and skin care Notes: Electronic Signature(s) Signed: 06/19/2019 6:12:41 PM By: Cherylin Mylar Entered By: Cherylin Mylar on 06/16/2019 13:05:55 -------------------------------------------------------------------------------- Pain Assessment Details Patient Name: Date of Service: [name], [name] 06/16/2019 12:45 PM Medical Record [id number] Patient Account Number: [id number] Date of Birth/Sex: Treating RN: 1960-05-09 (58 y.o. Judie Petit) Yevonne Pax Primary Care Niaya Hickok: SYSTEM, PCP Other Clinician: Referring Jonne Rote: Treating Elandra Powell/Extender:Robson, Willia Craze, Angela Cox in Treatment: 4 Active Problems Location of Pain Severity and Description of Pain Patient Has Paino No Site Locations Pain Management and Medication Current Pain Management: Electronic Signature(s) Signed: 06/16/2019 5:18:45 PM By: Yevonne Pax RN Entered By: Yevonne Pax on 06/16/2019 12:57:37 -------------------------------------------------------------------------------- Patient/Caregiver Education Details Patient Name: Date of Service: [name] 2/12/2021andnbsp12:45 PM Medical Record [id number] Patient Account Number: [id number] Date of Birth/Gender: 02-09-1961 (58 y.o. M) Treating RN: Cherylin Mylar Primary Care Physician: SYSTEM, PCP Other Clinician: Referring Physician: Treating Physician/Extender:Robson, Willia Craze, Angela Cox in Treatment: 4 Education Assessment Education Provided To: Patient Education Topics Provided Elevated Blood Sugar/ Impact on Healing: Methods: Explain/Verbal Responses: State content correctly Wound/Skin Impairment: Methods: Explain/Verbal Responses: State  content correctly Electronic Signature(s) Signed: 06/19/2019 6:12:41 PM By: Cherylin Mylar Entered By: Cherylin Mylar on 06/16/2019 13:06:13 -------------------------------------------------------------------------------- Wound Assessment Details Patient Name: Date of Service: [name], [name] 06/16/2019 12:45 PM Medical Record [id number] Patient Account Number: [id number] Date of Birth/Sex: Treating RN: [birth year] (59 y.o. [name] Primary Care Laikyn Gewirtz: SYSTEM, PCP Other Clinician: Referring Faige Seely: Treating Fares Ramthun/Extender:Robson, Willia Craze, Angela Cox in Treatment: 4 Wound Status Wound Number: 3 Primary Diabetic Wound/Ulcer of the Lower Extremity Etiology: Wound Location: Left Foot - Lateral Wound Open Wounding Event: Gradually Appeared Status: Date Acquired: 03/05/2019 Comorbid Glaucoma, Coronary Artery Disease, Deep Weeks Of Treatment: 4 History: Vein Thrombosis, Hypertension, Peripheral Clustered Wound: No Arterial Disease, Type II Diabetes, Dementia, Neuropathy, Seizure Disorder Photos Wound Measurements Length: (cm) 1.8 % Reduction in Are Width: (cm) 1.7 % Reduction in Vol Depth: (cm) 0.1 Epithelialization: Area: (cm) 2.403 Tunneling: Volume: (cm) 0.24 Undermining: Wound Description Classification: Grade 2 Foul Odor After C Wound Margin: Epibole Slough/Fibrino Exudate Amount: Large Exudate Type: Serosanguineous Exudate Color: red, brown Wound Bed Granulation Amount: Small (1-33%) Granulation Quality: Red Fascia Exposed: Necrotic Amount: Large (67-100%) Fat Layer (Subcut Necrotic Quality: Adherent Slough Tendon Exposed: Muscle Exposed: Necrosis of Joint Exposed: Bone Exposed: Electronic Signature(s) Signed: 06/19/2019 4:24:03  PM By: Benjaman Kindler EMT/HBOT Signed: 06/19/2019 6:12:41 PM By: Cherylin Mylar Previous Signature: 06/16/2019 5:18:45 PM Version By: Yevonne Pax Entered By: Benjaman Kindler on 02/15 leansing:  No Yes Exposed Structure No aneous Tissue) Exposed: Yes Yes Yes Muscle: Yes No Yes RN /2021 14:24:47 a: 18.2% ume: 95.5% None No No -------------------------------------------------------------------------------- Wound Assessment Details Patient Name: Date of Service: [name], [name] 06/16/2019 12:45 PM Medical Record [id number] Patient Account Number: [id number] Date of Birth/Sex: Treating RN: 04-26-1961 (59 y.o. [name] Primary Care Chesley Veasey: SYSTEM, PCP Other Clinician: Referring Jamae Tison: Treating Aeriel Boulay/Extender:Robson, Willia Craze, Angela Cox in Treatment: 4 Wound Status Wound Number: 4 Primary Diabetic Wound/Ulcer of the Lower Extremity Etiology: Wound Location: Right Toe Second Wound Open Wounding Event: Gradually Appeared Status: Date Acquired: 05/19/2019 Comorbid Glaucoma, Coronary Artery Disease, Deep Weeks Of Treatment: 4 History: Vein Thrombosis, Hypertension, Peripheral Clustered Wound: No Arterial Disease, Type II Diabetes, Dementia, Neuropathy, Seizure Disorder Photos Wound Measurements Length: (cm) 0.3 Width: (cm) 0.2 Depth: (cm) 0.1 Area: (cm) 0.047 Volume: (cm) 0.005 Wound Description Classification: Grade 2 Wound Margin: Distinct, outline attached Exudate Amount: Medium Exudate Type: Serosanguineous Exudate Color: red, brown Wound Bed Granulation Amount: Large (67-100%) Granulation Quality: Red, Hyper-granulation Necrotic Amount: Small (1-33%) Necrotic Quality: Adherent Slough After Cleansing: No brino Yes Exposed Structure posed: No (Subcutaneous Tissue) Exposed: Yes posed: No posed: No osed: No sed: Yes % Reduction in Area: 50% % Reduction in Volume: 44.4% Epithelialization: None Tunneling: No Undermining: No Foul Odor Slough/Fi Fascia Ex Fat Layer Tendon Ex Muscle Ex Joint Exp Bone Expo Electronic Signature(s) Signed: 06/19/2019 4:24:03 PM By: Benjaman Kindler EMT/HBOT Signed: 06/19/2019  6:12:41 PM By: Cherylin Mylar Previous Signature: 06/16/2019 5:18:45 PM Version By: Yevonne Pax RN Entered By: Benjaman Kindler on 06/19/2019 14:22:16 -------------------------------------------------------------------------------- Vitals Details Patient Name: Date of Service: Frank Fantasia D. 06/16/2019 12:45 PM Medical Record [id number] Patient Account Number: [id number] Date of Birth/Sex: Treating RN: Dec 14, 1960 (58 y.o. Judie Petit) Jettie Pagan, Lyla Son Primary Care Scherrie Seneca: SYSTEM, PCP Other Clinician: Referring Jahmya Onofrio: Treating Tonnie Stillman/Extender:Robson, Willia Craze, Angela Cox in Treatment: 4 Vital Signs Time Taken: 12:57 Temperature (F): 98 Height (in): 73 Pulse (bpm): 86 Weight (lbs): 207 Respiratory Rate (breaths/min): 18 Body Mass Index (BMI): 27.3 Blood Pressure (mmHg): 126/46 Reference Range: 80 - 120 mg / dl Electronic Signature(s) Signed: 06/16/2019 5:18:45 PM By: Yevonne Pax RN Entered By: Yevonne Pax on 06/16/2019 12:57:30

## 2019-06-19 NOTE — Progress Notes (Signed)
[name], [name] ([id number]) Visit Report for 06/16/2019 HPI Details Patient Name: Date of Service: Frank, [name] 06/16/2019 12:45 PM Medical Record [id number] Patient Account Number: [id number] Date of Birth/Sex: Treating RN: 1961/02/25 (59 y.o. [name] Primary Care Provider: SYSTEM, PCP Other Clinician: Referring Provider: Treating Provider/Extender:[name], [name], [name] in Treatment: 4 History of Present Illness HPI Description: ADMISSION 05/19/2019 [name] is now a 59 year old man with type 2 diabetes. He was actually in this clinic in 2006 in 2008. I do not have these records. More recently I actually looked after him in primary care with [name] up until 2016. Notable for the fact that I treated him for osteomyelitis in the [name] foot for an MRSA infection in the facility in 2014 or thereabouts. Gave him a prolonged course of vancomycin in the facility. My notes at the time do not list him has having significant PAD. He has severe diabetic neuropathy. The patient apparently has had wounds on his feet since November. This includes the [name] second toe and the left foot at the mid part of the fifth metatarsal. They are using Santyl on the left foot I am not clear what they are using to the [name] toe. I do not know if there is been any imaging studies done. It does not appear that his vascular status is been rechecked. 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 The patient is on a combination of Augmentin and Cipro both for 2  weeks. The patient had his arterial studies done at [name] yesterday I will need to research these results. We are using silver alginate on the left lateral foot and on the [name] second toe 2/12; he should be completing his antibiotics Augmentin and Cipro. Unfortunately had his arterial studies done at radiology at [name] they did not do [name] ABI or left ABIs or TBI's for that matter even though all of these were ordered. Nevertheless on the [name] there was triphasic waveforms of the common femoral artery, profunda femoris SFA, popliteal artery. Triphasic posterior tibial and anterior tibial as well as peroneal arteries. On the left there was a definite drop off of the distal SFA and popliteal were monophasic. Comment made that they may need to correlate with an ABI on the left even though this was ordered. We will call up and see what the problem was there. He has wounds on the left lateral fifth metatarsal head and the [name] second toe. Paradoxically both of the wounds look somewhat better Electronic Signature(s) Signed: 06/16/2019 5:45:04 PM By: Frank Najjar MD Entered By: [name] on 06/16/2019 14:36:49 -------------------------------------------------------------------------------- Physical Exam Details Patient Name: Date of Service: [name] 06/16/2019 12:45 PM Medical Record [id number] Patient Account Number: [id number] Date of Birth/Sex: Treating RN: [birth year] (59 y.o. [name] Primary Care Provider: SYSTEM, PCP Other Clinician: Referring Provider: Treating Provider/Extender:[name], [name], [name] in Treatment: 4 Constitutional Sitting or standing Blood Pressure is within target range for patient.. Pulse regular and within target range for patient.Marland Kitchen Respirations regular, non-labored and within target range.Marland Kitchen Appears in no distress. Cardiovascular Popliteal and femoral pulses are palpable. Posterior tibial pulse on the left is  palpable  I had trouble with the dorsalis pedis. Integumentary (Hair, Skin) There is no erythema around the wound. Notes Wound exam; [name] foot over the PIP of the second toe. Last time he was here I removed the exposed bone and this seems to be closing over. The major area on the left lateral foot still I think probes to bone although the tissue looks better. No mechanical debridement is required. Electronic Signature(s) Signed: 06/16/2019 5:45:04 PM By: Frank Najjar MD Entered By: [name] on 06/16/2019 14:41:40 -------------------------------------------------------------------------------- Physician Orders Details Patient Name: Date of Service: Frank, [name] 06/16/2019 12:45 PM Medical Record [id number] Patient Account Number: [id number] Date of Birth/Sex: Treating RN: [birth year] (59 y.o. [name] Primary Care Provider: SYSTEM, PCP Other Clinician: Referring Provider: Treating Provider/Extender:[name], [name], [name] in Treatment: 4 Verbal / Phone Orders: No Diagnosis Coding ICD-10 Coding Code Description E11.621 Type 2 diabetes mellitus with foot ulcer L97.524 Non-pressure chronic ulcer of other part of left foot with necrosis of bone L97.514 Non-pressure chronic ulcer of other part of [name] foot with necrosis of bone E11.51 Type 2 diabetes mellitus with diabetic peripheral angiopathy without gangrene Follow-up Appointments Return Appointment in 2 weeks. Dressing Change Frequency Change dressing every day. Skin Barriers/Peri-Wound Care Skin Prep Wound Cleansing Clean wound with Wound Cleanser Primary Wound Dressing Wound #3 Left,Lateral Foot Calcium Alginate with Silver Wound #4 [name] Toe Second Calcium Alginate with Silver Secondary Dressing Wound #3 Left,Lateral Foot Kerlix/Rolled Gauze Dry Gauze Wound #4 [name] Toe Second Kerlix/Rolled Gauze Dry Gauze Electronic Signature(s) Signed: 06/16/2019 5:45:04 PM By: Frank Najjar MD Signed: 06/19/2019 6:12:41 PM By: [name] Entered By: [name] on 06/16/2019 13:05:47 -------------------------------------------------------------------------------- Problem List Details Patient Name: Date of Service: [name] 06/16/2019 12:45 PM Medical Record [id number] Patient Account Number: [id number] Date of Birth/Sex: Treating RN: March 17, 1961 (59 y.o. [name] Primary Care Provider: SYSTEM, PCP Other Clinician: Referring Provider: Treating Provider/Extender:Cane Dubray, [name], [name] in Treatment: 4 Active Problems ICD-10 Evaluated Encounter Code Description Active Date Today Diagnosis E11.621 Type 2 diabetes mellitus with foot ulcer 05/19/2019 No Yes L97.524 Non-pressure chronic ulcer of other part of left foot 05/19/2019 No Yes with necrosis of bone L97.514 Non-pressure chronic ulcer of other part of [name] foot 05/19/2019 No Yes with necrosis of bone E11.51 Type 2 diabetes mellitus with diabetic peripheral 05/19/2019 No Yes angiopathy without gangrene Inactive Problems Resolved Problems Electronic Signature(s) Signed: 06/16/2019 5:45:04 PM By: Frank Najjar MD Entered By: [name] on 06/16/2019 14:34:33 -------------------------------------------------------------------------------- Progress Note Details Patient Name: Date of Service: [name] 06/16/2019 12:45 PM Medical Record [id number] Patient Account Number: [id number] Date of Birth/Sex: Treating RN: 1961/03/11 (59 y.o. [name] Primary Care Provider: SYSTEM, PCP Other Clinician: Referring Provider: Treating Provider/Extender:Shearon Clonch, [name], [name] in Treatment: 4 Subjective History of Present Illness (HPI) ADMISSION 05/19/2019 Mr. Dudash is now a 59 year old man with type 2 diabetes. He was actually in this clinic in 2006 in 2008. I do not have these records. More recently I actually looked after him in  primary care with [facility] up until 2016. Notable for the fact that I treated him for osteomyelitis in the [name] foot for an MRSA infection in the facility in 2014 or thereabouts. Gave him a prolonged course of vancomycin in the facility. My notes at the time do not list him has having significant PAD. He has severe diabetic neuropathy. The patient apparently has had wounds on his feet since November. This includes the [name]  second toe and the left foot at the mid part of the fifth metatarsal. They are using Santyl on the left foot I am not clear what they are using to the [name] toe. I do not know if there is been any imaging studies done. It does not appear that his vascular status is been rechecked. 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 The patient is on a combination of Augmentin and Cipro both for 2 weeks. The patient had his arterial studies done at [name] yesterday I will need to research these results. We are using silver alginate on the left lateral foot and on the [name] second toe 2/12; he should be completing his antibiotics Augmentin and Cipro. Unfortunately had his arterial studies done at radiology at [name] they did not do [name] ABI or left ABIs or TBI's for that matter even though all of these were ordered. Nevertheless on the [name] there was triphasic waveforms of the common femoral artery, profunda femoris SFA, popliteal artery. Triphasic posterior tibial and anterior tibial as well as peroneal arteries. On the left there was a definite drop off of the  distal SFA and popliteal were monophasic. Comment made that they may need to correlate with an ABI on the left even though this was ordered. We will call up and see what the problem was there. He has wounds on the left lateral fifth metatarsal head and the [name] second toe. Paradoxically both of the wounds look somewhat better Objective Constitutional Sitting or standing Blood Pressure is within target range for patient.. Pulse regular and within target range for patient.Marland Kitchen Respirations regular, non-labored and within target range.Marland Kitchen Appears in no distress. Vitals Time Taken: 12:57 PM, Height: 73 in, Weight: 207 lbs, BMI: 27.3, Temperature: 98 F, Pulse: 86 bpm, Respiratory Rate: 18 breaths/min, Blood Pressure: 126/46 mmHg. Cardiovascular Popliteal and femoral pulses are palpable. Posterior tibial pulse on the left is palpable I had trouble with the dorsalis pedis. General Notes: Wound exam; [name] foot over the PIP of the second toe. Last time he was here I removed the exposed bone and this seems to be closing over. ooThe major area on the left lateral foot still I think probes to bone although the tissue looks better. No mechanical debridement is required. Integumentary (Hair, Skin) There is no erythema around the wound. Wound #3 status is Open. Original cause of wound was Gradually Appeared. The wound is located on the Left,Lateral Foot. The wound measures 1.8cm length x 1.7cm width x 0.1cm depth; 2.403cm^2 area and 0.24cm^3 volume. There is bone, muscle, tendon, and Fat Layer (Subcutaneous Tissue) Exposed exposed. There is no tunneling or undermining noted. There is a large amount of serosanguineous drainage noted. The wound margin is epibole. There is small (1-33%) red granulation within the wound bed. There is a large (67-100%) amount of necrotic tissue within the wound bed including Adherent Slough and Necrosis of Muscle. Wound #4 status is Open. Original cause of wound was Gradually  Appeared. The wound is located on the [name] Toe Second. The wound measures 0.3cm length x 0.2cm width x 0.1cm depth; 0.047cm^2 area and 0.005cm^3 volume. There is bone and Fat Layer (Subcutaneous Tissue) Exposed exposed.  There is no tunneling or undermining noted. There is a medium amount of serosanguineous drainage noted. The wound margin is distinct with the outline attached to the wound base. There is large (67-100%) red, hyper - granulation within the wound bed. There is a small (1-33%) amount of necrotic tissue within the wound bed including Adherent Slough. 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 Dressing Change Frequency: Change dressing every day. Skin Barriers/Peri-Wound Care: Skin Prep Wound Cleansing: Clean wound with Wound Cleanser Primary Wound Dressing: Wound #3 Left,Lateral Foot: Calcium Alginate with Silver Wound #4 [name] Toe Second: Calcium Alginate with Silver Secondary Dressing: Wound #3 Left,Lateral Foot: Kerlix/Rolled Gauze Dry Gauze Wound #4 [name] Toe Second: Kerlix/Rolled Gauze Dry Gauze 1. I am not sure why the arterial studies were not completed as ordered. We will try to call up to [name]ology to clarify. The ABIs should have been done. Perhaps they do not have a toe cuff. 2. Nevertheless the study suggest there is a problem in the left distal SFA. 3. In spite of #2 the wound on the left lateral foot actually looks some better. He is completing 2 weeks of antibiotics 4. The second toe on the [name] also looks better. 5. Given the improvement in the wounds I will not rush into referring him for angiography unless these  things deteriorate. 6. I did not reorder antibiotics Electronic Signature(s) Signed: 06/16/2019 5:45:04 PM By: Linton Ham MD Entered By: Linton Ham on 06/16/2019 14:43:27 -------------------------------------------------------------------------------- SuperBill Details Patient Name: Date of Service: [name] 06/16/2019 Medical Record [id number] Patient Account Number: [id number] Date of Birth/Sex: Treating RN: 1960-12-02 (59 y.o. 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 E11.51 Type 2 diabetes mellitus with diabetic peripheral angiopathy without gangrene Facility Procedures CPT4 Code: [id number] Description: 99214 - WOUND CARE VISIT-LEV 4 EST PT Modifier: Quantity: 1 Physician Procedures CPT4 Code Description: [id number] - WC PHYS LEVEL 4 - EST PT ICD-10 Diagnosis Description E11.621 Type 2 diabetes mellitus with foot ulcer L97.524 Non-pressure chronic ulcer of other part of left foot L97.514 Non-pressure chronic ulcer of other part  of [name] foo E11.51 Type 2 diabetes mellitus with diabetic peripheral ang Modifier: with necrosis t with necrosis iopathy without Quantity: 1 of bone of bone gangrene Electronic Signature(s) Signed: 06/16/2019 5:45:04 PM By: Linton Ham MD Entered By: Linton Ham on 06/16/2019 14:43:48

## 2019-06-20 ENCOUNTER — Other Ambulatory Visit (HOSPITAL_COMMUNITY): Payer: Self-pay | Admitting: Internal Medicine

## 2019-06-20 ENCOUNTER — Other Ambulatory Visit: Payer: Self-pay | Admitting: Internal Medicine

## 2019-06-20 DIAGNOSIS — L97514 Non-pressure chronic ulcer of other part of right foot with necrosis of bone: Secondary | ICD-10-CM

## 2019-06-20 DIAGNOSIS — L97524 Non-pressure chronic ulcer of other part of left foot with necrosis of bone: Secondary | ICD-10-CM

## 2019-06-29 ENCOUNTER — Ambulatory Visit (HOSPITAL_COMMUNITY)
Admission: RE | Admit: 2019-06-29 | Discharge: 2019-06-29 | Disposition: A | Discharge status: Home / Self Care | Payer: Medicare Other | Source: Ambulatory Visit | Attending: Internal Medicine | Admitting: Internal Medicine

## 2019-06-29 ENCOUNTER — Other Ambulatory Visit: Payer: Self-pay

## 2019-06-29 DIAGNOSIS — L97524 Non-pressure chronic ulcer of other part of left foot with necrosis of bone: Secondary | ICD-10-CM | POA: Diagnosis present

## 2019-06-29 DIAGNOSIS — L97514 Non-pressure chronic ulcer of other part of right foot with necrosis of bone: Secondary | ICD-10-CM | POA: Diagnosis present

## 2019-06-30 ENCOUNTER — Encounter (HOSPITAL_BASED_OUTPATIENT_CLINIC_OR_DEPARTMENT_OTHER): Payer: Medicare Other | Admitting: Internal Medicine

## 2019-06-30 DIAGNOSIS — E11621 Type 2 diabetes mellitus with foot ulcer: Secondary | ICD-10-CM | POA: Diagnosis not present

## 2019-06-30 NOTE — Progress Notes (Signed)
[name], [name] ([id number]) Visit Report for 06/30/2019 HPI Details Patient Name: Date of Service: [name], [name] 06/30/2019 1:15 PM Medical Record [id number] Patient Account Number: [id number] Date of Birth/Sex: Treating RN: Jan 02, 1961 (59 y.o. [name] Primary Care Provider: SYSTEM, PCP Other Clinician: Referring Provider: Treating Provider/Extender:Dakwan Pridgen, Willia Craze, Angela Cox in Treatment: 6 History of Present Illness HPI Description: ADMISSION 05/19/2019 [name] is now a 59 year old man with type 2 diabetes. He was actually in this clinic in 2006 in 2008. I do not have these records. More recently I actually looked after him in primary care with [facility] up until 2016. Notable for the fact that I treated him for osteomyelitis in the [name] foot for an MRSA infection in the facility in 2014 or thereabouts. Gave him a prolonged course of vancomycin in the facility. My notes at the time do not list him has having significant PAD. He has severe diabetic neuropathy. The patient apparently has had wounds on his feet since November. This includes the [name] second toe and the left foot at the mid part of the fifth metatarsal. They are using Santyl on the left foot I am not clear what they are using to the [name] toe. I do not know if there is been any imaging studies done. It does not appear that his vascular status is been rechecked. 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 The patient is on a combination of Augmentin and Cipro both for 2 weeks.  The patient had his arterial studies done at [facility] yesterday I will need to research these results. We are using silver alginate on the left lateral foot and on the [name] second toe 2/12; he should be completing his antibiotics Augmentin and Cipro. Unfortunately had his arterial studies done at radiology at [facility] they did not do [name] ABI or left ABIs or TBI's for that matter even though all of these were ordered. Nevertheless on the [name] there was triphasic waveforms of the common femoral artery, profunda femoris SFA, popliteal artery. Triphasic posterior tibial and anterior tibial as well as peroneal arteries. On the left there was a definite drop off of the distal SFA and popliteal were monophasic. Comment made that they may need to correlate with an ABI on the left even though this was ordered. We will call up and see what the problem was there. He has wounds on the left lateral fifth metatarsal head and the [name] second toe. Paradoxically both of the wounds look somewhat better 2/26; patient had his arterial studies yesterday at [facility]. These showed an ABI of 0.97 at the dorsalis pedis on the [name] with a TBI of 0.54. Biphasic waveforms. On the left ABI at 0.94 at the dorsalis pedis TBI at 0.43. The area on the left lateral foot appears to be better. [name] second toe was closed. At discharge he was noted to have wounds on the left medial fifth and the left lateral fourth and the webspace. Both toes were tender and erythematous Electronic Signature(s) Signed: 06/30/2019 5:26:28 PM By: Baltazar Najjar MD Entered By: Baltazar Najjar on 06/30/2019 15:01:26 -------------------------------------------------------------------------------- Physical Exam Details Patient Name: Date of Service: [name].  06/30/2019 1:15 PM Medical Record [id number] Patient Account Number: [id number] Date of Birth/Sex: Treating RN: 1961/02/10 (59 y.o. [name] Primary Care Provider:  SYSTEM, PCP Other Clinician: Referring Provider: Treating Provider/Extender:Caid Radin, Willia Craze, Angela Cox in Treatment: 6 Constitutional Sitting or standing Blood Pressure is within target range for patient.. Pulse regular and within target range for patient.Marland Kitchen Respirations regular, non-labored and within target range.. Temperature is normal and within the target range for the patient.Marland Kitchen Appears in no distress. Notes Wound exam; the [name] second toe is healed. The left lateral foot has a divot in the middle the but it does not probe to bone. The tissue looks healthy. No mechanical debridement In the webspace of the left fourth and fifth toes there was an area on the medial part of the fifth toe at the level of the DIP and more proximally in the fourth toe laterally. Both toes are swollen red and tender Electronic Signature(s) Signed: 06/30/2019 5:26:28 PM By: Baltazar Najjar MD Entered By: Baltazar Najjar on 06/30/2019 15:02:20 -------------------------------------------------------------------------------- Physician Orders Details Patient Name: Date of Service: [name], [name] 06/30/2019 1:15 PM Medical Record [id number] Patient Account Number: [id number] Date of Birth/Sex: Treating RN: 06-22-1960 (59 y.o. [name] Primary Care Provider: SYSTEM, PCP Other Clinician: Referring Provider: Treating Provider/Extender:Sahana Boyland, Willia Craze, Angela Cox in Treatment: 6 Verbal / Phone Orders: No Diagnosis Coding ICD-10 Coding Code Description E11.621 Type 2 diabetes mellitus with foot ulcer L97.524 Non-pressure chronic ulcer of other part of left foot with necrosis of bone L97.514 Non-pressure chronic ulcer of other part of [name] foot with necrosis of bone E11.51 Type 2 diabetes mellitus with diabetic peripheral angiopathy without gangrene Follow-up Appointments Return Appointment in 2 weeks. Dressing Change Frequency Wound #3 Left,Lateral Foot Change dressing  every day. Wound #5 Left Toe Fourth Change dressing every day. Wound #6 Left Toe Third Change dressing every day. Skin Barriers/Peri-Wound Care Skin Prep Wound Cleansing Wound #3 Left,Lateral Foot Clean wound with Wound Cleanser Wound #5 Left Toe Fourth Clean wound with Wound Cleanser Wound #6 Left Toe Third Clean wound with Wound Cleanser Primary Wound Dressing Wound #3 Left,Lateral Foot Calcium Alginate with Silver Wound #5 Left Toe Fourth Calcium Alginate with Silver Wound #6 Left Toe Third Calcium Alginate with Silver Secondary Dressing Wound #3 Left,Lateral Foot Kerlix/Rolled Gauze Dry Gauze Wound #5 Left Toe Fourth Kerlix/Rolled Gauze Dry Gauze Wound #6 Left Toe Third Kerlix/Rolled Gauze Dry Gauze Off-Loading Multipodus Splint to: - both feet Radiology X-ray, foot complete view left - left foot, left 4th toe and left 3rd toe diabetic foot ulcers Patient Medications Allergies: No Known Allergies Notifications Medication Indication Start End doxycycline hyclate infection 06/30/2019 DOSE 1 - oral 100 mg tablet - 1 tablet oral 2 times per day Electronic Signature(s) Signed: 06/30/2019 5:01:55 PM By: Zenaida Deed RN, BSN Signed: 06/30/2019 5:26:28 PM By: Baltazar Najjar MD Entered By: Zenaida Deed on 06/30/2019 14:37:17 -------------------------------------------------------------------------------- Prescription 06/30/2019 Patient Name: [name]. Provider: Baltazar Najjar MD Date of Birth: [birth year] NPI#: [id number] Sex: Judie Petit DEA#: [id number] Phone #: [phone] License #: [id number] Patient Address: Eligha Bridegroom [facility] [address] [place] [name] [place], [place] [address] [place], [place] [address] [phone] Allergies No Known Allergies Medication Medication: Route: Strength: Form: doxycycline hyclate 100 mg tablet oral 100 mg tablet Class: TETRACYCLINE  ANTIBIOTICS Dose: Frequency / Time: Indication: 1 1 tablet oral 2 times per day infection Number of Refills: Number of Units:  0 Fourteen (14) Tablet(s) Generic Substitution: Start Date: End Date: One Time Use: Substitution Permitted 01/16/7828 No Note to Pharmacy: Signature(s): Date(s): Electronic Signature(s) Signed: 06/30/2019 5:01:55 PM By: Baruch Gouty RN, BSN Signed: 06/30/2019 5:26:28 PM By: Linton Ham MD Entered By: Baruch Gouty on 06/30/2019 14:37:17 --------------------------------------------------------------------------------  Problem List Details Patient Name: Date of Service: [name], [name] 06/30/2019 1:15 PM Medical Record [id number] Patient Account Number: [id number] Date of Birth/Sex: Treating RN: 10/05/1960 (59 y.o. [name] Primary Care Provider: SYSTEM, PCP Other Clinician: Referring Provider: Treating Provider/Extender:Mariavictoria Nottingham, Ileene Rubens, Ermalene Searing in Treatment: 6 Active Problems ICD-10 Evaluated Encounter Code Description Active Date Today Diagnosis E11.621 Type 2 diabetes mellitus with foot ulcer 05/19/2019 No Yes L97.524 Non-pressure chronic ulcer of other part of left foot 05/19/2019 No Yes with necrosis of bone E11.51 Type 2 diabetes mellitus with diabetic peripheral 05/19/2019 No Yes angiopathy without gangrene L97.521 Non-pressure chronic ulcer of other part of left foot 06/30/2019 No Yes limited to breakdown of skin Inactive Problems ICD-10 Code Description Active Date Inactive Date L97.514 Non-pressure chronic ulcer of other part of [name] foot with 05/19/2019 05/19/2019 necrosis of bone Resolved Problems Electronic Signature(s) Signed: 06/30/2019 5:26:28 PM By: Linton Ham MD Entered By: Linton Ham on 06/30/2019 14:58:59 -------------------------------------------------------------------------------- Progress Note Details Patient Name: Date of Service: [name] 06/30/2019 1:15 PM Medical  Record [id number] Patient Account Number: [id number] Date of Birth/Sex: Treating RN: [birth year] (59 y.o. [name] Primary Care Provider: SYSTEM, PCP Other Clinician: Referring Provider: Treating Provider/Extender:Criselda Starke, Ileene Rubens, Ermalene Searing in Treatment: 6 Subjective History of Present Illness (HPI) ADMISSION 05/19/2019 Mr. Zuercher is now a 59 year old man with type 2 diabetes. He was actually in this clinic in 2006 in 2008. I do not have these records. More recently I actually looked after him in primary care with [facility] up until 2016. Notable for the fact that I treated him for osteomyelitis in the [name] foot for an MRSA infection in the facility in 2014 or thereabouts. Gave him a prolonged course of vancomycin in the facility. My notes at the time do not list him has having significant PAD. He has severe diabetic neuropathy. The patient apparently has had wounds on his feet since November. This includes the [name] second toe and the left foot at the mid part of the fifth metatarsal. They are using Santyl on the left foot I am not clear what they are using to the [name] toe. I do not know if there is been any imaging studies done. It does not appear that his vascular status is been rechecked. 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 The patient is on a combination of Augmentin and Cipro both for 2 weeks. The patient had his arterial studies done at [facility] yesterday I will need to research these results. We are using silver alginate on the left  lateral foot and on the [name] second toe 2/12; he should be completing his antibiotics Augmentin and Cipro. Unfortunately had his arterial studies done at radiology at [facility] they did not do [name] ABI or left ABIs or TBI's for that matter even though all of these were ordered. Nevertheless on the [name]  there was triphasic waveforms of the common femoral artery, profunda femoris SFA, popliteal artery. Triphasic posterior tibial and anterior tibial as well as peroneal arteries. On the left there was a definite drop off of the distal SFA and popliteal were monophasic. Comment made that they may need to correlate with an ABI on the left even though this was ordered. We will call up and see what the problem was there. He has wounds on the left lateral fifth metatarsal head and the [name] second toe. Paradoxically both of the wounds look somewhat better 2/26; patient had his arterial studies yesterday at [facility]. These showed an ABI of 0.97 at the dorsalis pedis on the [name] with a TBI of 0.54. Biphasic waveforms. On the left ABI at 0.94 at the dorsalis pedis TBI at 0.43. The area on the left lateral foot appears to be better. [name] second toe was closed. At discharge he was noted to have wounds on the left medial fifth and the left lateral fourth and the webspace. Both toes were tender and erythematous Objective Constitutional Sitting or standing Blood Pressure is within target range for patient.. Pulse regular and within target range for patient.Marland Kitchen. Respirations regular, non-labored and within target range.. Temperature is normal and within the target range for the patient.Marland Kitchen. Appears in no distress. Vitals Time Taken: 1:25 PM, Height: 73 in, Weight: 207 lbs, BMI: 27.3, Temperature: 98.7 F, Pulse: 88 bpm, Respiratory Rate: 18 breaths/min, Blood Pressure: 133/52 mmHg. General Notes: Wound exam; the [name] second toe is healed. ooThe left lateral foot has a divot in the middle the but it does not  probe to bone. The tissue looks healthy. No mechanical debridement ooIn the webspace of the left fourth and fifth toes there was an area on the medial part of the fifth toe at the level of the DIP and more proximally in the fourth toe laterally. Both toes are swollen red and tender Integumentary (Hair, Skin) Wound #3 status is Open. Original cause of wound was Gradually Appeared. The wound is located on the Left,Lateral Foot. The wound measures 1.5cm length x 0.8cm width x 0.8cm depth; 0.942cm^2 area and 0.754cm^3 volume. There is bone, muscle, tendon, and Fat Layer (Subcutaneous Tissue) Exposed exposed. There is no tunneling noted, however, there is undermining starting at 4:00 and ending at 2:00 with a maximum distance of 1cm. There is a large amount of serosanguineous drainage noted. The wound margin is epibole. There is large (67-100%) red granulation within the wound bed. There is no necrotic tissue within the wound bed. Wound #4 status is Healed - Epithelialized. Original cause of wound was Gradually Appeared. The wound is located on the [name] Toe Second. The wound measures 0cm length x 0cm width x 0cm depth; 0cm^2 area and 0cm^3 volume. Wound #5 status is Open. Original cause of wound was Gradually Appeared. The wound is located on the Left Toe Fourth. The wound measures 0.4cm length x 0.3cm width x 0.1cm depth; 0.094cm^2 area and 0.009cm^3 volume. There is Fat Layer (Subcutaneous Tissue) Exposed exposed. There is no tunneling or undermining noted. There is a small amount of serous drainage noted. The wound margin is flat and intact. There is small (1-33%) pink granulation within the wound bed. There is a large (67-100%) amount of necrotic tissue within the wound bed including Adherent Slough. Wound #6 status is Open. Original cause of wound was Gradually Appeared. The wound is located on the Left Toe Third. The wound measures 0.5cm length x 0.4cm width x 0.1cm  depth; 0.157cm^2 area and  0.016cm^3 volume. There is Fat Layer (Subcutaneous Tissue) Exposed exposed. There is no tunneling or undermining noted. There is a small amount of serous drainage noted. The wound margin is flat and intact. There is no granulation within the wound bed. There is a large (67-100%) amount of necrotic tissue within the wound bed including Adherent Slough. Assessment Active Problems ICD-10 Type 2 diabetes mellitus with foot ulcer Non-pressure chronic ulcer of other part of left foot with necrosis of bone Type 2 diabetes mellitus with diabetic peripheral angiopathy without gangrene Non-pressure chronic ulcer of other part of left foot limited to breakdown of skin Plan Follow-up Appointments: Return Appointment in 2 weeks. Dressing Change Frequency: Wound #3 Left,Lateral Foot: Change dressing every day. Wound #5 Left Toe Fourth: Change dressing every day. Wound #6 Left Toe Third: Change dressing every day. Skin Barriers/Peri-Wound Care: Skin Prep Wound Cleansing: Wound #3 Left,Lateral Foot: Clean wound with Wound Cleanser Wound #5 Left Toe Fourth: Clean wound with Wound Cleanser Wound #6 Left Toe Third: Clean wound with Wound Cleanser Primary Wound Dressing: Wound #3 Left,Lateral Foot: Calcium Alginate with Silver Wound #5 Left Toe Fourth: Calcium Alginate with Silver Wound #6 Left Toe Third: Calcium Alginate with Silver Secondary Dressing: Wound #3 Left,Lateral Foot: Kerlix/Rolled Gauze Dry Gauze Wound #5 Left Toe Fourth: Kerlix/Rolled Gauze Dry Gauze Wound #6 Left Toe Third: Kerlix/Rolled Gauze Dry Gauze Off-Loading: Multipodus Splint to: - both feet Radiology ordered were: X-ray, foot complete view left - left foot, left 4th toe and left 3rd toe diabetic foot ulcers The following medication(s) was prescribed: doxycycline hyclate oral 100 mg tablet 1 1 tablet oral 2 times per day for infection starting 06/30/2019 1. I think the patient probably has arterial disease and  the normal ABIs are falsely elevated nevertheless his major wounds appear to be improving on the left lateral although he has new wounds on the fourth and fifth toes as described 2. Probable cellulitis of the left foot and the fourth and fifth toes I prescribed doxycycline 100 twice daily for 7 days as well as an x-ray. 3. His [name] second toe is healed. 4. I still cannot rule out having to send him to vascular although for now I think things are stable as long as the wounds continue to contract. Electronic Signature(s) Signed: 06/30/2019 5:26:28 PM By: Baltazar Najjar MD Entered By: Baltazar Najjar on 06/30/2019 15:03:38 -------------------------------------------------------------------------------- SuperBill Details Patient Name: Date of Service: [name] 06/30/2019 Medical Record [id number] Patient Account Number: [id number] Date of Birth/Sex: Treating RN: Dec 14, 1960 (59 y.o. [name] Primary Care Provider: SYSTEM, PCP Other Clinician: Referring Provider: Treating Provider/Extender:Darnita Woodrum, Willia Craze, Angela Cox in Treatment: 6 Diagnosis Coding ICD-10 Codes Code Description E11.621 Type 2 diabetes mellitus with foot ulcer L97.524 Non-pressure chronic ulcer of other part of left foot with necrosis of bone L97.514 Non-pressure chronic ulcer of other part of [name] foot with necrosis of bone E11.51 Type 2 diabetes mellitus with diabetic peripheral angiopathy without gangrene Facility Procedures CPT4 Code: [id number] Description: 99214 - WOUND CARE VISIT-LEV 4 EST PT Modifier: Quantity: 1 Physician Procedures CPT4 Code Description: [id number] 99213 - WC PHYS LEVEL 3 - EST PT ICD-10 Diagnosis Description E11.621 Type 2 diabetes mellitus with foot ulcer L97.524 Non-pressure chronic ulcer of other part of left foot wi L97.514 Non-pressure chronic ulcer of other  part of [name] foot w E11.51 Type 2 diabetes mellitus with diabetic peripheral angiop Modifier: th  necrosis of bo ith necrosis of b athy without  gang Quantity: 1 ne one TEFL teacher) Signed: 06/30/2019 5:26:28 PM By: Baltazar Najjar MD Entered By: Baltazar Najjar on 06/30/2019 15:03:57

## 2019-07-14 ENCOUNTER — Other Ambulatory Visit: Payer: Self-pay

## 2019-07-14 ENCOUNTER — Encounter (HOSPITAL_BASED_OUTPATIENT_CLINIC_OR_DEPARTMENT_OTHER): Payer: Medicare Other | Attending: Internal Medicine | Admitting: Internal Medicine

## 2019-07-14 DIAGNOSIS — E1140 Type 2 diabetes mellitus with diabetic neuropathy, unspecified: Secondary | ICD-10-CM | POA: Diagnosis not present

## 2019-07-14 DIAGNOSIS — E1151 Type 2 diabetes mellitus with diabetic peripheral angiopathy without gangrene: Secondary | ICD-10-CM | POA: Insufficient documentation

## 2019-07-14 DIAGNOSIS — I2510 Atherosclerotic heart disease of native coronary artery without angina pectoris: Secondary | ICD-10-CM | POA: Insufficient documentation

## 2019-07-14 DIAGNOSIS — H409 Unspecified glaucoma: Secondary | ICD-10-CM | POA: Insufficient documentation

## 2019-07-14 DIAGNOSIS — L97521 Non-pressure chronic ulcer of other part of left foot limited to breakdown of skin: Secondary | ICD-10-CM | POA: Insufficient documentation

## 2019-07-14 DIAGNOSIS — G40909 Epilepsy, unspecified, not intractable, without status epilepticus: Secondary | ICD-10-CM | POA: Insufficient documentation

## 2019-07-14 DIAGNOSIS — Z794 Long term (current) use of insulin: Secondary | ICD-10-CM | POA: Diagnosis not present

## 2019-07-14 DIAGNOSIS — L97514 Non-pressure chronic ulcer of other part of right foot with necrosis of bone: Secondary | ICD-10-CM | POA: Insufficient documentation

## 2019-07-14 DIAGNOSIS — L97524 Non-pressure chronic ulcer of other part of left foot with necrosis of bone: Secondary | ICD-10-CM | POA: Insufficient documentation

## 2019-07-14 DIAGNOSIS — E785 Hyperlipidemia, unspecified: Secondary | ICD-10-CM | POA: Diagnosis not present

## 2019-07-14 DIAGNOSIS — F0390 Unspecified dementia without behavioral disturbance: Secondary | ICD-10-CM | POA: Insufficient documentation

## 2019-07-14 DIAGNOSIS — L97529 Non-pressure chronic ulcer of other part of left foot with unspecified severity: Secondary | ICD-10-CM | POA: Diagnosis present

## 2019-07-14 DIAGNOSIS — I10 Essential (primary) hypertension: Secondary | ICD-10-CM | POA: Insufficient documentation

## 2019-07-14 DIAGNOSIS — E11621 Type 2 diabetes mellitus with foot ulcer: Secondary | ICD-10-CM | POA: Insufficient documentation

## 2019-07-17 NOTE — Progress Notes (Signed)
AZUL, BRUMETT ([id number]) Visit Report for 07/14/2019 Arrival Information Details Patient Name: Date of Service: [name], [name] 07/14/2019 1:15 PM Medical Record [id number] Patient Account Number: [id number] Date of Birth/Sex: Treating RN: [birth year] (58 y.o. [name]) [name] Primary Care [name]: SYSTEM, PCP Other Clinician: Referring [name]: Treating [name]/Extender:[name], [name] in Treatment: 8 Visit Information History Since Last Visit All ordered tests and consults were completed: No Patient Arrived: Wheel Chair Added or deleted any medications: No Arrival Time: 13:17 Any new allergies or adverse reactions: No Accompanied By: self Had a fall or experienced change in No Transfer Assistance: Nurse, adult activities of daily living that may affect Patient Identification Verified: Yes risk of falls: Secondary Verification Process Yes Signs or symptoms of abuse/neglect since last No Completed: visito Patient Requires Transmission-Based No Hospitalized since last visit: No Precautions: Implantable device outside of the clinic excluding No Patient Has Alerts: Yes cellular tissue based products placed in the center Patient Alerts: R and L ABIs since last visit: N/C Has Dressing in Place as Prescribed: Yes Pain Present Now: No Electronic Signature(s) Signed: 07/14/2019 5:30:50 PM By: Frank Pax RN Entered By: [name] on 07/14/2019 13:18:55 -------------------------------------------------------------------------------- Encounter Discharge Information Details Patient Name: Date of Service: Frank Fantasia D. 07/14/2019 1:15 PM Medical Record [id number] Patient Account Number: [id number] Date of Birth/Sex: Treating RN: Oct 11, 1960 (59 y.o. [name] Primary Care Deane Wattenbarger: SYSTEM, PCP Other Clinician: Referring Lavinia Mcneely: Treating Sargun Rummell/Extender:[name], [name] in Treatment: 8 Encounter Discharge  Information Items Post Procedure Vitals Discharge Condition: Stable Temperature (F): 98.3 Ambulatory Status: Wheelchair Pulse (bpm): 85 Discharge Destination: Home Respiratory Rate (breaths/min): 18 Transportation: Private Auto Blood Pressure (mmHg): 124/60 Accompanied By: self Schedule Follow-up Appointment: Yes Clinical Summary of Care: Electronic Signature(s) Signed: 07/14/2019 5:45:00 PM By: [name] Entered By: [name] on 07/14/2019 14:20:45 -------------------------------------------------------------------------------- Lower Extremity Assessment Details Patient Name: Date of Service: [name] 07/14/2019 1:15 PM Medical Record [id number] Patient Account Number: [id number] Date of Birth/Sex: Treating RN: 10/04/1960 (58 y.o. [name]) [name] Primary Care Mildreth Reek: SYSTEM, PCP Other Clinician: Referring Lataria Courser: Treating Jakavion Bilodeau/Extender:[name], [name] in Treatment: 8 Edema Assessment Assessed: [Left: No] [Right: No] Edema: [Left: No] [Right: No] Calf Left: Right: Point of Measurement: cm From Medial Instep 29 cm 28 cm Ankle Left: Right: Point of Measurement: cm From Medial Instep 20 cm 19 cm Electronic Signature(s) Signed: 07/14/2019 5:30:50 PM By: Frank Pax RN Entered By: [name] on 07/14/2019 13:20:54 -------------------------------------------------------------------------------- Multi Wound Chart Details Patient Name: Date of Service: Frank Fantasia D. 07/14/2019 1:15 PM Medical Record [id number] Patient Account Number: [id number] Date of Birth/Sex: Treating RN: March 30, 1961 (59 y.o. 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] [5:Lower Extremity] [6:Lower Extremity] Comorbid History: [3:Glaucoma, Coronary Artery Glaucoma, Coronary Artery Glaucoma, Coronary Artery Disease, Deep Vein Thrombosis, Hypertension, Thrombosis, Hypertension, Thrombosis, Hypertension, Peripheral Arterial Disease, Peripheral Arterial Disease,  Peripheral Arterial Disease, Type II Diabetes, Dementia, Type II Diabetes, Dementia, Type II Diabetes, Dementia, Neuropathy, Seizure Disorder] [5:Disease, Deep Vein Neuropathy, Seizure Disorder] [6:Disease, Deep Vein Neuropathy,  Seizure Disorder] Date Acquired: [3:03/05/2019] [5:06/30/2019] [6:06/30/2019] Weeks of Treatment: [3:8] [5:2] [6:2] Wound Status: [3:Open] [5:Open] [6:Open] Measurements L x W x D 1.8x1.2x0.9 [5:0.7x0.6x0.1] [6:0.7x1x0.1] (cm) Area (cm) : [3:1.696] [5:0.33] [6:0.55] Volume (cm) : [3:1.527] [5:0.033] [6:0.055] % Reduction in Area: [3:42.30%] [5:-251.10%] [6:-250.30%] % Reduction in Volume: 71.10% [5:-266.70%] [6:-243.70%] Classification: [3:Grade 2] [5:Grade 1] [6:Grade 1] Exudate Amount: [3:Large] [5:Small] [6:Small] Exudate Type: [3:Serosanguineous] [5:Serous] [6:Serous] Exudate Color: [3:red, brown] [5:amber] [6:amber] Wound Margin: [3:Epibole] [5:Flat and Intact] [6:Flat and Intact] Granulation Amount: [3:Large (67-100%)] [5:Small (1-33%)] [6:Medium (34-66%)] Granulation Quality: [3:Red] [5:Pink] [6:N/A] Necrotic Amount: [3:None Present (0%)] [5:Large (67-100%)] [6:Medium (34-66%)] Exposed Structures: [3:Fat Layer (Subcutaneous Fat Layer (Subcutaneous Fat Layer (Subcutaneous Tissue) Exposed: Yes Tendon:  Yes Muscle: Yes Bone: Yes Fascia: No Joint: No] [5:Tissue) Exposed: Yes Fascia: No Tendon: No Muscle: No Joint: No Bone: No] [6:Tissue) Exposed: Yes  Fascia: No Tendon: No Muscle: No Joint: No Bone: No] Epithelialization: [3:Small (1-33%)] [5:Small (1-33%)] [6:Small (1-33%)] Debridement: [3:N/A] [5:Debridement - Excisional Debridement - Excisional] Pre-procedure [3:N/A] [5:14:00] [6:14:00] Verification/Time Out Taken: Pain Control: [3:N/A] [5:Other] [6:Other] Tissue Debrided: [3:N/A] [5:Necrotic/Eschar, Subcutaneous] [6:Necrotic/Eschar, Subcutaneous] Level: [3:N/A] [5:Skin/Subcutaneous Tissue Skin/Subcutaneous Tissue] Debridement Area (sq cm):N/A [5:0.42] [6:0.7] Instrument: [3:N/A] [5:Curette] [6:Curette] Bleeding: [3:N/A] [5:Minimum] [6:Minimum] Hemostasis Achieved: [3:N/A] [5:Pressure] [6:Pressure] Procedural Pain: [3:N/A] [5:0] [6:0] Post Procedural Pain: [3:N/A] [5:0] [6:0] Debridement Treatment [3:N/A] [5:Procedure was tolerated] [6:Procedure was tolerated] Response: [5:well] [6:well] Post Debridement [3:N/A] [5:0.7x0.6x0.1] [6:0.7x1x0.1] Measurements L x W x D (cm) Post Debridement [3:N/A] [5:0.033] [6:0.055] Volume: (cm) Procedures Performed: [3:N/A] [5:Debridement] [6:Debridement] Treatment Notes Electronic Signature(s) Signed: 07/14/2019 5:31:36 PM By: Cherylin Mylar Signed: 07/17/2019 8:43:01 AM By: Baltazar Najjar MD Entered By: Baltazar Najjar on 07/14/2019 14:12:54 -------------------------------------------------------------------------------- Multi-Disciplinary Care Plan Details Patient Name: Date of Service: Frank Fantasia D. 07/14/2019 1:15 PM Medical Record [id number] Patient Account Number: [id number] Date of Birth/Sex: Treating RN: 1961-05-04 (58 y.o. [name] Primary Care Oneida Mckamey: SYSTEM, PCP Other Clinician: Referring Jeiden Daughtridge: Treating Maud Rubendall/Extender:[name], [name] in Treatment: 8 Active  Inactive Nutrition Nursing Diagnoses: Imbalanced nutrition Goals: Patient/caregiver will maintain therapeutic glucose control Date Initiated: 05/19/2019 Target Resolution Date: 07/28/2019 Goal Status: Active Interventions: Provide education on elevated blood sugars and impact on wound healing Notes: Wound/Skin Impairment Nursing Diagnoses: Impaired tissue integrity Goals: Ulcer/skin breakdown will have a volume reduction of 50% by week 8 Date Initiated: 05/19/2019 Target Resolution Date: 07/28/2019 Goal Status: Active Interventions: Provide education on ulcer and skin care Notes: Electronic Signature(s) Signed: 07/14/2019 5:31:36 PM By: Cherylin Mylar Entered By: Cherylin Mylar on 07/14/2019 13:09:44 -------------------------------------------------------------------------------- Pain Assessment Details Patient Name: Date of Service: BRANDIN, STETZER 07/14/2019 1:15 PM Medical Record [id number] Patient Account Number: [id number] Date of Birth/Sex: Treating RN: March 13, 1961 (58 y.o. Melonie [place] Primary Care Brok Stocking: SYSTEM, PCP Other Clinician: Referring Lincoln Kleiner: Treating Breslin Hemann/Extender:[name], [name] in Treatment: 8 Active Problems Location of Pain Severity and Description of Pain Patient Has Paino No Site Locations Pain Management and Medication Current Pain Management: Electronic Signature(s) Signed: 07/14/2019 5:30:50 PM By: Frank Pax RN Entered By: [name] on 07/14/2019 13:20:40 -------------------------------------------------------------------------------- Patient/Caregiver Education Details Patient Name: Date of Service: Wisniewski, Cleave D. 3/12/2021andnbsp1:15 PM Medical Record [id number] Patient Account Number: [id number] Date of Birth/Gender: 04/19/1961 (58 y.o. M) Treating RN: Cherylin Mylar Primary Care Physician: SYSTEM, PCP Other Clinician: Referring Physician: Treating Physician/Extender:Robson,  Willia [name], [name] in Treatment: 8 Education Assessment Education Provided To: Patient Education Topics Provided Elevated Blood Sugar/ Impact on Healing: Methods: Explain/Verbal Responses: State content correctly  Wound/Skin Impairment: Methods: Explain/Verbal Responses: State content correctly Electronic Signature(s) Signed: 07/14/2019 5:31:36 PM By: Cherylin Mylar Entered By: Cherylin Mylar on 07/14/2019 13:10:00 -------------------------------------------------------------------------------- Wound Assessment Details Patient Name: Date of Service: IGNATIUS, KLOOS 07/14/2019 1:15 PM Medical Record [id number] Patient Account Number: [id number] Date of Birth/Sex: Treating RN: [birth year] (58 y.o. [name]) [name] Primary Care Javen Hinderliter: SYSTEM, PCP Other Clinician: Referring Gracielynn Birkel: Treating Hermina Barnard/Extender:[name], [name] in Treatment: 8 Wound Status Wound Number: 3 Primary Diabetic Wound/Ulcer of the Lower Extremity Etiology: Wound Location: Left Foot - Lateral Wound Open Wounding Event: Gradually Appeared Status: Date Acquired: 03/05/2019 Comorbid Glaucoma, Coronary Artery Disease, Deep Weeks Of Treatment: 8 History: Vein Thrombosis, Hypertension, Peripheral Clustered Wound: No Arterial Disease, Type II Diabetes, Dementia, Neuropathy, Seizure Disorder Wound Measurements Length: (cm) 1.8 Width: (cm) 1.2 Depth: (cm) 0.9 Area: (cm) 1.696 Volume: (cm) 1.527 Wound Description Classification: Grade 2 Wound Margin: Epibole Exudate Amount: Large Exudate Type: Serosanguineous Exudate Color: red, brown Wound Bed Granulation Amount: Large (67-100%) Granulation Quality: Red Necrotic Amount: None Present (0%) Foul Odor After Cleansing: No Slough/Fibrino No Exposed Structure Fascia Exposed: No Fat Layer (Subcutaneous Tissue) Exposed: Rosita Fire Tendon Exposed: Rosita Fire Muscle Exposed: Ye Necrosis of Muscle: Rosita Fire Joint Exposed: No Bone  Exposed: Ye % Reduction in Area: 42.3% % Reduction in Volume: 71.1% Epithelialization: Small (1-33%) Tunneling: No Undermining: No s s s s s Treatment Notes Wound #3 (Left, Lateral Foot) 1. Cleanse With Wound Cleanser 3. Primary Dressing Applied Calcium Alginate Ag 4. Secondary Dressing Dry Gauze Roll Gauze Foam 5. Secured With [employer] tape Notes Librarian, academic) Signed: 07/14/2019 5:30:50 PM By: Frank Pax RN Entered By: [name] on 07/14/2019 13:24:36 -------------------------------------------------------------------------------- Wound Assessment Details Patient Name: Date of Service: TUDOR, CHANDLEY 07/14/2019 1:15 PM Medical Record [id number] Patient Account Number: [id number] Date of Birth/Sex: Treating RN: June 18, 1960 (58 y.o. [name]) [name] Primary Care Demareon Coldwell: SYSTEM, PCP Other Clinician: Referring Corneilus Heggie: Treating Arlin Sass/Extender:[name], [name] in Treatment: 8 Wound Status Wound Number: 5 Primary Diabetic Wound/Ulcer of the Lower Extremity Etiology: Wound Location: Left Toe Fourth Wound Open Wounding Event: Gradually Appeared Status: Status: Date Acquired: 06/30/2019 Comorbid Glaucoma, Coronary Artery Disease, Deep Weeks Of Treatment: 2 History: Vein Thrombosis, Hypertension, Peripheral Clustered Wound: No Arterial Disease, Type II Diabetes, Dementia, Neuropathy, Seizure Disorder Wound Measurements Length: (cm) 0.7 Width: (cm) 0.6 Depth: (cm) 0.1 Area: (cm) 0.33 Volume: (cm) 0.033 Wound Description Classification: Grade 1 Wound Margin: Flat and Intact Exudate Amount: Small Exudate Type: Serous Exudate Color: amber Wound Bed Granulation Amount: Small (1-33%) Granulation Quality: Pink Necrotic Amount: Large (67-100%) Necrotic Quality: Adherent Slough After Cleansing: No brino Yes Exposed Structure posed: No (Subcutaneous Tissue) Exposed: Yes posed: No posed: No osed:  No sed: No % Reduction in Area: -251.1% % Reduction in Volume: -266.7% Epithelialization: Small (1-33%) Tunneling: No Undermining: No Foul Odor Slough/Fi Fascia Ex Fat Layer Tendon Ex Muscle Ex Joint Exp Bone Expo Treatment Notes Wound #5 (Left Toe Fourth) 1. Cleanse With Wound Cleanser 3. Primary Dressing Applied Calcium Alginate Ag 4. Secondary Dressing Dry Gauze Roll Gauze Foam 5. Secured With [employer] tape Notes Librarian, academic) Signed: 07/14/2019 5:30:50 PM By: Frank Pax RN Entered By: [name] on 07/14/2019 13:24:49 -------------------------------------------------------------------------------- Wound Assessment Details Patient Name: Date of Service: LAZARO, ISENHOWER 07/14/2019 1:15 PM Medical Record [id number] Patient Account Number: [id number] Date of Birth/Sex: Treating RN: December 29, 1960 (58 y.o. [name]) [name] Primary Care Senta Kantor: Other Clinician: SYSTEM, PCP Referring Stormee Duda: Treating Sunaina Ferrando/Extender:Robson, Casimiro Needle  Dayton Bailiff in Treatment: 8 Wound Status Wound Number: 6 Primary Diabetic Wound/Ulcer of the Lower Extremity Etiology: Wound Location: Left Toe Third Wound Open Wounding Event: Gradually Appeared Status: Date Acquired: 06/30/2019 Comorbid Glaucoma, Coronary Artery Disease, Deep Weeks Of Treatment: 2 History: Vein Thrombosis, Hypertension, Peripheral Clustered Wound: No Arterial Disease, Type II Diabetes, Dementia, Neuropathy, Seizure Disorder Wound Measurements Length: (cm) 0.7 Width: (cm) 1 Depth: (cm) 0.1 Area: (cm) 0.55 Volume: (cm) 0.055 Wound Description Classification: Grade 1 Wound Margin: Flat and Intact Exudate Amount: Small Exudate Type: Serous Exudate Color: amber Wound Bed Granulation Amount: Medium (34-66%) Necrotic Amount: Medium (34-66%) Necrotic Quality: Adherent Slough fter Cleansing: No ino Yes Exposed Structure sed: No Subcutaneous Tissue) Exposed:  Yes sed: No sed: No ed: No d: No % Reduction in Area: -250.3% % Reduction in Volume: -243.7% Epithelialization: Small (1-33%) Tunneling: No Undermining: No Foul Odor A Slough/Fibr Fascia Expo Fat Layer ( Tendon Expo Muscle Expo Joint Expos Bone Expose Treatment Notes Wound #6 (Left Toe Third) 1. Cleanse With Wound Cleanser 3. Primary Dressing Applied Calcium Alginate Ag 4. Secondary Dressing Dry Gauze Roll Gauze Foam 5. Secured With [employer] tape Notes Environmental consultant) Signed: 07/14/2019 5:30:50 PM By: Carlene Coria RN Entered By: Carlene Coria on 07/14/2019 13:25:01 -------------------------------------------------------------------------------- Vitals Details Patient Name: Date of Service: Curt Bears D. 07/14/2019 1:15 PM Medical Record [id number] Patient Account Number: [id number] Date of Birth/Sex: Treating RN: [birth year] (58 y.o. Jerilynn Mages) Carlene Coria Primary Care Meaghann Choo: SYSTEM, PCP Other Clinician: Referring Lemya Greenwell: Treating Rahsaan Weakland/Extender:Robson, Ileene Rubens, Ermalene Searing in Treatment: 8 Vital Signs Time Taken: 13:19 Temperature (F): 98.3 Height (in): 73 Pulse (bpm): 85 Weight (lbs): 207 Respiratory Rate (breaths/min): 18 Body Mass Index (BMI): 27.3 Blood Pressure (mmHg): 124/60 Reference Range: 80 - 120 mg / dl Electronic Signature(s) Signed: 07/14/2019 5:30:50 PM By: Carlene Coria RN Entered By: Carlene Coria on 07/14/2019 13:20:27

## 2019-07-17 NOTE — Progress Notes (Signed)
[name], [name] ([id number]) Visit Report for 07/14/2019 Debridement Details Patient Name: Date of Service: [name], [name] 07/14/2019 1:15 PM Medical Record [id number] Patient Account Number: [id number] Date of Birth/Sex: Treating RN: 09/12/1960 (59 y.o. [name] Primary Care Provider: SYSTEM, PCP Other Clinician: Referring Provider: Treating Provider/Extender:Flecia Shutter, Willia Craze, Angela Cox in Treatment: 8 Debridement Performed for Wound #5 Left Toe Fourth Assessment: Performed By: Physician Maxwell Caul., MD Debridement Type: Debridement Severity of Tissue Pre Fat layer exposed Debridement: Level of Consciousness (Pre- Awake and Alert procedure): Pre-procedure Verification/Time Out Taken: Yes - 14:00 Start Time: 14:00 Pain Control: Other : benzocaine, 20% Total Area Debrided (L x W): 0.7 (cm) x 0.6 (cm) = 0.42 (cm) Tissue and other material Viable, Non-Viable, Eschar, Subcutaneous debrided: Level: Skin/Subcutaneous Tissue Debridement Description: Excisional Instrument: Curette Bleeding: Minimum Hemostasis Achieved: Pressure End Time: 14:01 Procedural Pain: 0 Post Procedural Pain: 0 Response to Treatment: Procedure was tolerated well Level of Consciousness Awake and Alert (Post-procedure): Post Debridement Measurements of Total Wound Length: (cm) 0.7 Width: (cm) 0.6 Depth: (cm) 0.1 Volume: (cm) 0.033 Character of Wound/Ulcer Post Improved Debridement: Severity of Tissue Post Debridement: Fat layer exposed Post Procedure Diagnosis Same as Pre-procedure Electronic Signature(s) Signed: 07/14/2019 5:31:36 PM By: Cherylin Mylar Signed: 07/17/2019 8:43:01 AM By: Baltazar Najjar MD Entered By: Baltazar Najjar on 07/14/2019 14:13:04 -------------------------------------------------------------------------------- Debridement Details Patient Name: Date of Service: Frank Fantasia D. 07/14/2019 1:15 PM Medical Record [id number]  Patient Account Number: [id number] Date of Birth/Sex: Treating RN: [birth year] (59 y.o. [name] Primary Care Provider: SYSTEM, PCP Other Clinician: Referring Provider: Treating Provider/Extender:Phoua Hoadley, Willia Craze, Angela Cox in Treatment: 8 Debridement Performed for Wound #6 Left Toe Third Assessment: Performed By: Physician Maxwell Caul., MD Debridement Type: Debridement Severity of Tissue Pre Fat layer exposed Debridement: Level of Consciousness (Pre- Awake and Alert procedure): Pre-procedure Yes - 14:00 Verification/Time Out Taken: Start Time: 14:00 Pain Control: Other : benzocaine, 20% Total Area Debrided (L x W): 0.7 (cm) x 1 (cm) = 0.7 (cm) Tissue and other material Viable, Non-Viable, Eschar, Subcutaneous debrided: Level: Skin/Subcutaneous Tissue Debridement Description: Excisional Instrument: Curette Bleeding: Minimum Hemostasis Achieved: Pressure End Time: 14:01 Procedural Pain: 0 Post Procedural Pain: 0 Response to Treatment: Procedure was tolerated well Level of Consciousness Awake and Alert (Post-procedure): Post Debridement Measurements of Total Wound Length: (cm) 0.7 Width: (cm) 1 Depth: (cm) 0.1 Volume: (cm) 0.055 Character of Wound/Ulcer Post Improved Debridement: Severity of Tissue Post Debridement: Fat layer exposed Post Procedure Diagnosis Same as Pre-procedure Electronic Signature(s) Signed: 07/14/2019 5:31:36 PM By: Cherylin Mylar Signed: 07/17/2019 8:43:01 AM By: Baltazar Najjar MD Entered By: Baltazar Najjar on 07/14/2019 14:13:11 -------------------------------------------------------------------------------- HPI Details Patient Name: Date of Service: Frank Fantasia D. 07/14/2019 1:15 PM Medical Record [id number] Patient Account Number: [id number] Date of Birth/Sex: Treating RN: 1960-05-27 (59 y.o. [name] Primary Care Provider: SYSTEM, PCP Other Clinician: Referring Provider: Treating  Provider/Extender:Selah Klang, Willia Craze, Angela Cox in Treatment: 8 History of Present Illness HPI Description: ADMISSION 05/19/2019 Mr. Kerlin is now a 59 year old man with type 2 diabetes. He was actually in this clinic in 2006 in 2008. I do not have these records. More recently I actually looked after him in primary care with [facility] up until 2016. Notable for the fact that I treated him for osteomyelitis in the right foot for an MRSA infection in the facility in 2014 or thereabouts. Gave him a prolonged course of vancomycin in the facility. My notes at the time do not list  him has having significant PAD. He has severe diabetic neuropathy. The patient apparently has had wounds on his feet since November. This includes the right second toe and the left foot at the mid part of the fifth metatarsal. They are using Santyl on the left foot I am not clear what they are using to the right toe. I do not know if there is been any imaging studies done. It does not appear that his vascular status is been rechecked. 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 The patient is on a combination of Augmentin and Cipro both for 2 weeks. The patient had his arterial studies done at [facility] yesterday I will need to research these results. We are using silver alginate on the left lateral foot and on the right second toe 2/12; he should be completing his antibiotics Augmentin and Cipro. Unfortunately had his arterial studies done at radiology at [facility] they did not do right ABI or left ABIs or  TBI's for that matter even though all of these were ordered. Nevertheless on the right there was triphasic waveforms of the common femoral artery, profunda femoris SFA, popliteal artery. Triphasic posterior tibial and anterior tibial as well as peroneal arteries. On the left there was a definite drop off of the distal SFA and popliteal were monophasic. Comment made that they may need to correlate with an ABI on the left even though this was ordered. We will call up and see what the problem was there. He has wounds on the left lateral fifth metatarsal head and the right second toe. Paradoxically both of the wounds look somewhat better 2/26; patient had his arterial studies yesterday at [facility]. These showed an ABI of 0.97 at the dorsalis pedis on the right with a TBI of 0.54. Biphasic waveforms. On the left ABI at 0.94 at the dorsalis pedis TBI at 0.43. The area on the left lateral foot appears to be better. Right second toe was closed. At discharge he was noted to have wounds on the left medial fifth and the left lateral fourth and the webspace. Both toes were tender and erythematous 3/12. The area on the left lateral foot appears to be contracting. He has areas on the left medial third and the lateral fourth. These may be friction areas from the toes pressing against each other. X-rays of the left foot did not comment osteomyelitis done at the facility Electronic Signature(s) Signed: 07/17/2019 8:43:01 AM By: Linton Ham MD Entered By: Linton Ham on 07/14/2019 14:14:43 -------------------------------------------------------------------------------- Physical Exam Details Patient Name: Date of Service: [name] 07/14/2019 1:15 PM Medical Record [id number] Patient Account Number: [id number] Date of Birth/Sex: Treating RN: [birth year] (59 y.o. [name] Primary Care Provider: SYSTEM, PCP Other Clinician: Referring Provider: Treating Provider/Extender:Taiten Brawn,  Ileene Rubens, Ermalene Searing in Treatment: 8 Constitutional Sitting or standing Blood Pressure is within target range for patient.. Pulse regular and within target range for patient.Marland Kitchen Respirations regular, non-labored and within target range.. Temperature is normal and within the target range for the patient.Marland Kitchen Appears in no distress. Notes Wound exam; everything is closed on the right. The left lateral foot wound which was the major wound he had  when he came in here is also closed. He has 2 necrotic wounds on the medial fourth and lateral third toes. Both of these require debridement with a #3 curette to clean up the surfaces. They look like indentation injuries although I am not sure if that is true Electronic Signature(s) Signed: 07/17/2019 8:43:01 AM By: Baltazar Najjar MD Entered By: Baltazar Najjar on 07/14/2019 14:17:51 -------------------------------------------------------------------------------- Physician Orders Details Patient Name: Date of Service: [name] 07/14/2019 1:15 PM Medical Record [id number] Patient Account Number: [id number] Date of Birth/Sex: Treating RN: 1960/08/27 (59 y.o. [name] Primary Care Provider: SYSTEM, PCP Other Clinician: Referring Provider: Treating Provider/Extender:Clarice Zulauf, Willia Craze, Angela Cox in Treatment: 8 Verbal / Phone Orders: No Diagnosis Coding ICD-10 Coding Code Description E11.621 Type 2 diabetes mellitus with foot ulcer L97.524 Non-pressure chronic ulcer of other part of left foot with necrosis of bone E11.51 Type 2 diabetes mellitus with diabetic peripheral angiopathy without gangrene L97.521 Non-pressure chronic ulcer of other part of left foot limited to breakdown of skin Follow-up Appointments Return Appointment in 2 weeks. Dressing Change Frequency Wound #3 Left,Lateral Foot Change dressing every day. Wound #5 Left Toe Fourth Change dressing every day. Wound #6 Left Toe Third Change  dressing every day. Skin Barriers/Peri-Wound Care Skin Prep Wound Cleansing Wound #3 Left,Lateral Foot Clean wound with Wound Cleanser Wound #5 Left Toe Fourth Clean wound with Wound Cleanser Wound #6 Left Toe Third Clean wound with Wound Cleanser Primary Wound Dressing Wound #3 Left,Lateral Foot Calcium Alginate with Silver Wound #5 Left Toe Fourth Calcium Alginate with Silver Foam - use to separate 3rd and 4th toes Wound #6 Left Toe Third Calcium Alginate with Silver Foam - use to separate 3rd and 4th toes Secondary Dressing Wound #3 Left,Lateral Foot Kerlix/Rolled Gauze Dry Gauze Wound #5 Left Toe Fourth Kerlix/Rolled Gauze Dry Gauze Wound #6 Left Toe Third Kerlix/Rolled Gauze Dry Gauze Off-Loading Multipodus Splint to: - both feet Electronic Signature(s) Signed: 07/14/2019 5:31:36 PM By: Cherylin Mylar Signed: 07/17/2019 8:43:01 AM By: Baltazar Najjar MD Entered By: Cherylin Mylar on 07/14/2019 14:04:00 -------------------------------------------------------------------------------- Problem List Details Patient Name: Date of Service: Frank Fantasia D. 07/14/2019 1:15 PM Medical Record [id number] Patient Account Number: [id number] Date of Birth/Sex: Treating RN: 1961-03-06 (59 y.o. [name] Primary Care Provider: SYSTEM, PCP Other Clinician: Referring Provider: Treating Provider/Extender:Cire Deyarmin, Willia Craze, Angela Cox in Treatment: 8 Active Problems ICD-10 Evaluated Encounter Code Description Active Date Today Diagnosis E11.621 Type 2 diabetes mellitus with foot ulcer 05/19/2019 No Yes L97.524 Non-pressure chronic ulcer of other part of left foot 05/19/2019 No Yes with necrosis of bone E11.51 Type 2 diabetes mellitus with diabetic peripheral 05/19/2019 No Yes angiopathy without gangrene L97.521 Non-pressure chronic ulcer of other part of left foot 06/30/2019 No Yes limited to breakdown of skin Inactive Problems ICD-10 Code  Description Active Date Inactive Date L97.514 Non-pressure chronic ulcer of other part of right foot with 05/19/2019 05/19/2019 necrosis of bone Resolved Problems Electronic Signature(s) Signed: 07/17/2019 8:43:01 AM By: Baltazar Najjar MD Entered By: Baltazar Najjar on 07/14/2019 14:12:47 -------------------------------------------------------------------------------- Progress Note Details Patient Name: Date of Service: Frank Fantasia D. 07/14/2019 1:15 PM Medical Record [id number] Patient Account Number: [id number] Date of Birth/Sex: Treating RN: [birth year] (59 y.o. [name] Primary Care Provider: SYSTEM, PCP Other Clinician: Referring Provider: Treating Provider/Extender:Demya Scruggs, Willia Craze, Angela Cox in Treatment: 8 Subjective History of Present Illness (HPI) ADMISSION 05/19/2019 [name] is now a 59 year old man with type 2 diabetes. He was actually in this clinic in 2006  in 2008. I do not have these records. More recently I actually looked after him in primary care with [facility] up until 2016. Notable for the fact that I treated him for osteomyelitis in the right foot for an MRSA infection in the facility in 2014 or thereabouts. Gave him a prolonged course of vancomycin in the facility. My notes at the time do not list him has having significant PAD. He has severe diabetic neuropathy. The patient apparently has had wounds on his feet since November. This includes the right second toe and the left foot at the mid part of the fifth metatarsal. They are using Santyl on the left foot I am not clear what they are using to the right toe. I do not know if there is been any imaging studies done. It does not appear that his vascular status is been rechecked. 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 The patient is on a combination of Augmentin and Cipro both for 2 weeks. The patient had his arterial studies done at [facility] yesterday I will need to research these results. We are using silver alginate on the left lateral foot and on the right second toe 2/12; he should be completing his antibiotics Augmentin and Cipro. Unfortunately had his arterial [facility] they did not do right ABI or left ABIs or TBI's for that matter even though all of these were ordered. Nevertheless on the right there was triphasic waveforms of the common femoral artery, profunda femoris SFA, popliteal artery. Triphasic posterior tibial and anterior tibial as well as peroneal arteries. On the left there was a definite drop off of the distal SFA and popliteal were monophasic. Comment made that they may need to correlate with an ABI on the left even though this was ordered. We will call up and see what the problem was there. He has wounds on the left lateral fifth metatarsal head and the right second toe. Paradoxically both of the wounds look somewhat better 2/26; patient [facility]. These showed an ABI of 0.97 at the dorsalis pedis on the right with a TBI of 0.54. Biphasic waveforms. On the left ABI at 0.94 at the dorsalis pedis TBI at 0.43. The area on the left lateral foot appears to be better. Right second toe was closed. At discharge he was noted to have wounds on the left medial fifth and the left lateral fourth and the webspace. Both toes were tender and erythematous 3/12. The area on the left lateral foot appears to be contracting. He has areas on the left medial third and the lateral fourth.  These may be friction areas from the toes pressing against each other. X-rays of the left foot did not comment osteomyelitis done at the facility Objective Constitutional Sitting or standing Blood Pressure is within target range for patient.. Pulse regular and within target range for patient.Marland Kitchen. Respirations regular, non-labored and within target range.. Temperature is normal and within the target range for the patient.Marland Kitchen. Appears in no distress. Vitals Time Taken: 1:19 PM, Height: 73 in, Weight: 207 lbs, BMI: 27.3, Temperature: 98.3 F, Pulse: 85 bpm, Respiratory  Rate: 18 breaths/min, Blood Pressure: 124/60 mmHg. General Notes: Wound exam; everything is closed on the right. The left lateral foot wound which was the major wound he had when he came in here is also closed. He has 2 necrotic wounds on the medial fourth and lateral third toes. Both of these require debridement with a #3 curette to clean up the surfaces. They look like indentation injuries although I am not sure if that is true Integumentary (Hair, Skin) Wound #3 status is Open. Original cause of wound was Gradually Appeared. The wound is located on the Left,Lateral Foot. The wound measures 1.8cm length x 1.2cm width x 0.9cm depth; 1.696cm^2 area and 1.527cm^3 volume. There is bone, muscle, tendon, and Fat Layer (Subcutaneous Tissue) Exposed exposed. There is no tunneling or undermining noted. There is a large amount of serosanguineous drainage noted. The wound margin is epibole. There is large (67-100%) red granulation within the wound bed. There is no necrotic tissue within the wound bed. Wound #5 status is Open. Original cause of wound was Gradually Appeared. The wound is located on the Left Toe Fourth. The wound measures 0.7cm length x 0.6cm width x 0.1cm depth; 0.33cm^2 area and 0.033cm^3 volume. There is Fat Layer (Subcutaneous Tissue) Exposed exposed. There is no tunneling or undermining noted. There is a small amount of serous  drainage noted. The wound margin is flat and intact. There is small (1-33%) pink granulation within the wound bed. There is a large (67-100%) amount of necrotic tissue within the wound bed including Adherent Slough. Wound #6 status is Open. Original cause of wound was Gradually Appeared. The wound is located on the Left Toe Third. The wound measures 0.7cm length x 1cm width x 0.1cm depth; 0.55cm^2 area and 0.055cm^3 volume. There is Fat Layer (Subcutaneous Tissue) Exposed exposed. There is no tunneling or undermining noted. There is a small amount of serous drainage noted. The wound margin is flat and intact. There is medium (34-66%) granulation within the wound bed. There is a medium (34-66%) amount of necrotic tissue within the wound bed including Adherent Slough. Assessment Active Problems ICD-10 Type 2 diabetes mellitus with foot ulcer Non-pressure chronic ulcer of other part of left foot with necrosis of bone Type 2 diabetes mellitus with diabetic peripheral angiopathy without gangrene Non-pressure chronic ulcer of other part of left foot limited to breakdown of skin Procedures Wound #5 Pre-procedure diagnosis of Wound #5 is a Diabetic Wound/Ulcer of the Lower Extremity located on the Left Toe Fourth .Severity of Tissue Pre Debridement is: Fat layer exposed. There was a Excisional Skin/Subcutaneous Tissue Debridement with a total area of 0.42 sq cm performed by Maxwell Caul., MD. With the following instrument(s): Curette to remove Viable and Non-Viable tissue/material. Material removed includes Eschar and Subcutaneous Tissue and after achieving pain control using Other (benzocaine, 20%). No specimens were taken. A time out was conducted at 14:00, prior to the start of the procedure. A Minimum amount of bleeding was controlled with Pressure. The procedure was tolerated well with a pain level of 0 throughout and a pain level of 0 following the procedure. Post Debridement  Measurements: 0.7cm length x 0.6cm width x 0.1cm depth; 0.033cm^3 volume. Character of Wound/Ulcer Post Debridement is improved. Severity of Tissue Post Debridement is: Fat layer exposed. Post procedure Diagnosis Wound #5: Same as Pre-Procedure Wound #6 Pre-procedure diagnosis of Wound #6 is a Diabetic Wound/Ulcer of the Lower Extremity located on the Left Toe Third .Severity of Tissue Pre Debridement is: Fat layer exposed.  There was a Excisional Skin/Subcutaneous Tissue Debridement with a total area of 0.7 sq cm performed by Maxwell Caul., MD. With the following instrument(s): Curette to remove Viable and Non-Viable tissue/material. Material removed includes Eschar and Subcutaneous Tissue and after achieving pain control using Other (benzocaine, 20%). No specimens were taken. A time out was conducted at 14:00, prior to the start of the procedure. A Minimum amount of bleeding was controlled with Pressure. The procedure was tolerated well with a pain level of 0 throughout and a pain level of 0 following the procedure. Post Debridement Measurements: 0.7cm length x 1cm width x 0.1cm depth; 0.055cm^3 volume. Character of Wound/Ulcer Post Debridement is improved. Severity of Tissue Post Debridement is: Fat layer exposed. Post procedure Diagnosis Wound #6: Same as Pre-Procedure Plan Follow-up Appointments: Return Appointment in 2 weeks. Dressing Change Frequency: Wound #3 Left,Lateral Foot: Change dressing every day. Wound #5 Left Toe Fourth: Change dressing every day. Wound #6 Left Toe Third: Change dressing every day. Skin Barriers/Peri-Wound Care: Skin Prep Wound Cleansing: Wound #3 Left,Lateral Foot: Clean wound with Wound Cleanser Wound #5 Left Toe Fourth: Clean wound with Wound Cleanser Wound #6 Left Toe Third: Clean wound with Wound Cleanser Primary Wound Dressing: Wound #3 Left,Lateral Foot: Calcium Alginate with Silver Wound #5 Left Toe Fourth: Calcium Alginate with  Silver Foam - use to separate 3rd and 4th toes Wound #6 Left Toe Third: Calcium Alginate with Silver Foam - use to separate 3rd and 4th toes Secondary Dressing: Wound #3 Left,Lateral Foot: Kerlix/Rolled Gauze Dry Gauze Wound #5 Left Toe Fourth: Kerlix/Rolled Gauze Dry Gauze Wound #6 Left Toe Third: Kerlix/Rolled Gauze Dry Gauze Off-Loading: Multipodus Splint to: - both feet 1. We applied silver alginate to all wounds 2. He has a large wounds on the left lateral third toe and the right medial fourth toe. Unfortunately each 1 of these requires debridement. 3 the original wound on the left lateral foot actually looks some better. This was at 1 point down to bone it is filled in nicely healthy granulation Electronic Signature(s) Signed: 07/17/2019 8:43:01 AM By: Baltazar Najjar MD Entered By: Baltazar Najjar on 07/14/2019 14:19:30 -------------------------------------------------------------------------------- SuperBill Details Patient Name: Date of Service: [name] 07/14/2019 Medical Record [id number] Patient Account Number: [id number] Date of Birth/Sex: Treating RN: 07/06/1960 (59 y.o. [name] Primary Care Provider: SYSTEM, PCP Other Clinician: Referring Provider: Treating Provider/Extender:Caven Perine, Willia Craze, Angela Cox in Treatment: 8 Diagnosis Coding ICD-10 Codes Code Description E11.621 Type 2 diabetes mellitus with foot ulcer L97.524 Non-pressure chronic ulcer of other part of left foot with necrosis of bone E11.51 Type 2 diabetes mellitus with diabetic peripheral angiopathy without gangrene L97.521 Non-pressure chronic ulcer of other part of left foot limited to breakdown of skin Facility Procedures CPT4 Code Description: [id number] 11042 - DEB SUBQ TISSUE 20 SQ CM/< ICD-10 Diagnosis Description L97.524 Non-pressure chronic ulcer of other part of left foot with necro Modifier: sis of bone Quantity: 1 Physician Procedures CPT4 Code  Description: [id number] 11042 - WC PHYS SUBQ TISS 20 SQ CM ICD-10 Diagnosis Description L97.524 Non-pressure chronic ulcer of other part of left foot wi Modifier: th necrosis of Quantity: 1 bone Electronic Signature(s) Signed: 07/17/2019 8:43:01 AM By: Baltazar Najjar MD Entered By: Baltazar Najjar on 07/14/2019 14:19:47

## 2019-07-28 ENCOUNTER — Other Ambulatory Visit: Payer: Self-pay

## 2019-07-28 ENCOUNTER — Other Ambulatory Visit (HOSPITAL_COMMUNITY)
Admission: RE | Admit: 2019-07-28 | Discharge: 2019-07-28 | Disposition: A | Discharge status: Home / Self Care | Payer: Medicare Other | Source: Other Acute Inpatient Hospital | Attending: Internal Medicine | Admitting: Internal Medicine

## 2019-07-28 ENCOUNTER — Encounter (HOSPITAL_BASED_OUTPATIENT_CLINIC_OR_DEPARTMENT_OTHER): Payer: Medicare Other | Admitting: Internal Medicine

## 2019-07-28 DIAGNOSIS — L97524 Non-pressure chronic ulcer of other part of left foot with necrosis of bone: Secondary | ICD-10-CM | POA: Diagnosis present

## 2019-07-28 DIAGNOSIS — E11621 Type 2 diabetes mellitus with foot ulcer: Secondary | ICD-10-CM | POA: Diagnosis not present

## 2019-07-29 NOTE — Progress Notes (Signed)
[name], Frank ([id number]) Visit Report for 07/28/2019 Arrival Information Details Patient Name: Date of Service: [name], [name] 07/28/2019 1:15 PM Medical Record [id number] Patient Account Number: [id number] Date of Birth/Sex: Treating RN: [birth year] (59 y.o. [name] Primary Care Alexandro Line: SYSTEM, PCP Other Clinician: Referring Talmadge Ganas: Treating Railynn Ballo/Extender:Robson, Willia Craze, Angela Cox in Treatment: 10 Visit Information History Since Last Visit Added or deleted any medications: No Patient Arrived: Wheel Chair Any new allergies or adverse reactions: No Arrival Time: 13:45 Had a fall or experienced change in No Accompanied By: caregiver activities of daily living that may affect Transfer Assistance: None risk of falls: Patient Identification Verified: Yes Signs or symptoms of abuse/neglect No Secondary Verification Process Yes since last visito Completed: Hospitalized since last visit: No Patient Requires Transmission-Based No Implantable device outside of the clinic No Precautions: excluding Patient Has Alerts: Yes cellular tissue based products placed in Patient Alerts: R and L ABIs the center N/C since last visit: Has Dressing in Place as Prescribed: Yes Has Footwear/Offloading in Place as Yes Prescribed: Left: Multipodus Split/Boot Right: Multipodus Split/Boot Pain Present Now: No Electronic Signature(s) Signed: 07/28/2019 5:42:35 PM By: Shawn Stall Entered By: Shawn Stall on 07/28/2019 13:50:37 -------------------------------------------------------------------------------- Clinic Level of Care Assessment Details Patient Name: Date of Service: [name], Frank 07/28/2019 1:15 PM Medical Record [id number] Patient Account Number: [id number] Date of Birth/Sex: Treating RN: [birth year] (59 y.o. [name] Primary Care Damiya Sandefur: SYSTEM, PCP Other Clinician: Referring Copper Basnett: Treating Karim Aiello/Extender:Robson,  Willia Craze, Angela Cox in Treatment: 10 Clinic Level of Care Assessment Items TOOL 4 Quantity Score X - Use when only an EandM is performed on FOLLOW-UP visit 1 0 ASSESSMENTS - Nursing Assessment / Reassessment X - Reassessment of Co-morbidities (includes updates in patient status) 1 10 X - Reassessment of Adherence to Treatment Plan 1 5 ASSESSMENTS - Wound and Skin Assessment / Reassessment X - Simple Wound Assessment / Reassessment - one wound 1 5 []  - Complex Wound Assessment / Reassessment - multiple wounds 0 []  - Dermatologic / Skin Assessment (not related to wound area) 0 ASSESSMENTS - Focused Assessment X - Circumferential Edema Measurements - multi extremities 1 5 []  - Nutritional Assessment / Counseling / Intervention 0 []  - Lower Extremity Assessment (monofilament, tuning fork, pulses) 0 []  - Peripheral Arterial Disease Assessment (using hand held doppler) 0 ASSESSMENTS - Ostomy and/or Continence Assessment and Care []  - Incontinence Assessment and Management 0 []  - Ostomy Care Assessment and Management (repouching, etc.) 0 PROCESS - Coordination of Care X - Simple Patient / Family Education for ongoing care 1 15 []  - Complex (extensive) Patient / Family Education for ongoing care 0 X - Staff obtains , Records, Test Results / Process Orders 1 10 X - Staff telephones HHA, Nursing Homes / Clarify orders / etc 1 10 []  - Routine Transfer to another Facility (non-emergent condition) 0 []  - Routine Hospital Admission (non-emergent condition) 0 []  - New Admissions / / Ordering NPWT, Apligraf, etc. 0 []  - Emergency Hospital Admission (emergent condition) 0 X - Simple Discharge Coordination 1 10 []  - Complex (extensive) Discharge Coordination 0 PROCESS - Special Needs []  - Pediatric / Minor Patient Management 0 []  - Isolation Patient Management 0 []  - Hearing / Language / Visual special needs 0 []  - Assessment of Community assistance  (transportation, D/C planning, etc.) 0 []  - Additional assistance / Altered mentation 0 []  - Support Surface(s) Assessment (bed, cushion, seat, etc.) 0 INTERVENTIONS - Wound Cleansing / Measurement []  -  Simple Wound Cleansing - one wound 0 X - Complex Wound Cleansing - multiple wounds 3 5 X - Wound Imaging (photographs - any number of wounds) 1 5 []  - Wound Tracing (instead of photographs) 0 []  - Simple Wound Measurement - one wound 0 X - Complex Wound Measurement - multiple wounds 3 5 INTERVENTIONS - Wound Dressings X - Small Wound Dressing one or multiple wounds 1 10 []  - Medium Wound Dressing one or multiple wounds 0 []  - Large Wound Dressing one or multiple wounds 0 X - Application of Medications - topical 1 5 []  - Application of Medications - injection 0 INTERVENTIONS - Miscellaneous []  - External ear exam 0 X - Specimen Collection (cultures, biopsies, blood, body fluids, etc.) 1 5 X - Specimen(s) / Culture(s) sent or taken to Lab for analysis 1 5 []  - Patient Transfer (multiple staff / / Similar devices) 0 []  - Simple Staple / Suture removal (25 or less) 0 []  - Complex Staple / Suture removal (26 or more) 0 []  - Hypo / Hyperglycemic Management (close monitor of Blood Glucose) 0 []  - Ankle / Brachial Index (ABI) - do not check if billed separately 0 X - Vital Signs 1 5 Has the patient been seen at the hospital within the last three years: Yes Total Score: 135 Level Of Care: New/Established - Level 4 Electronic Signature(s) Signed: 07/28/2019 5:41:24 PM By: Entered By: on 07/28/2019 14:12:23 -------------------------------------------------------------------------------- Encounter Discharge Information Details Patient Name: Date of Service: D. 07/28/2019 1:15 PM Medical Record Nurse, adult Patient Account Number: Date of Birth/Sex: Treating RN: 09/14/1960 (59 y.o. Primary Care  Lolita Faulds: SYSTEM, PCP Other Clinician: Referring Treva Huyett: Treating Renell Coaxum/Extender:Robson, 07/30/2019, Cherylin Mylar in Treatment: 10 Encounter Discharge Information Items Post Procedure Vitals Discharge Condition: Stable Temperature (F): 98.6 Ambulatory Status: Wheelchair Pulse (bpm): 80 Discharge Destination: Home Respiratory Rate (breaths/min): 16 Transportation: Private Auto Blood Pressure (mmHg): 128/59 Accompanied By: caregiver Schedule Follow-up Appointment: Yes Clinical Summary of Care: Electronic Signature(s) Signed: 07/28/2019 5:42:35 PM By: 07/30/2019 Entered By: [name] on 07/28/2019 16:02:57 -------------------------------------------------------------------------------- Lower Extremity Assessment Details Patient Name: Date of Service: ISAIR, INABINET 07/28/2019 1:15 PM Medical Record 10/18/1960 Patient Account Number: [id number] Date of Birth/Sex: Treating RN: 1960/08/12 (59 y.o. Angela Cox Primary Care Derrien Anschutz: SYSTEM, PCP Other Clinician: Referring Zoriana Oats: Treating Yarixa Lightcap/Extender:Robson, 07/30/2019, Shawn Stall in Treatment: 10 Edema Assessment Assessed: [Left: Yes] [Right: No] Edema: [Left: No] [Right: No] Calf Left: Right: Point of Measurement: cm From Medial Instep 28 cm cm Ankle Left: Right: Point of Measurement: cm From Medial Instep 18.7 cm cm Electronic Signature(s) Signed: 07/28/2019 5:42:35 PM By: 07/30/2019 Entered By: [name] on 07/28/2019 13:51:27 -------------------------------------------------------------------------------- Multi Wound Chart Details Patient Name: Date of Service: [id number] 07/28/2019 1:15 PM Medical Record 10/18/1960 Patient Account Number: [id number] Date of Birth/Sex: Treating RN: 01-30-1961 (59 y.o. 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] [5:Lower Extremity] [6:Lower Extremity] Comorbid History: [3:Glaucoma, Coronary Artery Glaucoma, Coronary Artery Glaucoma, Coronary Artery Disease,  Deep Vein Thrombosis, Hypertension, Thrombosis, Hypertension, Thrombosis, Hypertension, Peripheral Arterial Disease, Peripheral Arterial Disease,  Peripheral Arterial Disease, Type II Diabetes, Dementia, Type II Diabetes, Dementia, Type II Diabetes, Dementia, Neuropathy, Seizure Disorder] [5:Disease, Deep Vein Neuropathy, Seizure Disorder] [6:Disease, Deep Vein Neuropathy, Seizure Disorder] Date Acquired: [3:03/05/2019] [5:06/30/2019] [6:06/30/2019] Weeks of Treatment: [3:10] [5:4] [6:4] Wound Status: [3:Open] [5:Open] [6:Open] Measurements L x W x D 1.7x0.9x0.6 [5:0.1x0.1x0.1] [6:1.4x1.3x0.3] (cm) Area (cm) : [3:1.202] [5:0.008] [6:1.429] Volume (cm) : [3:0.721] [5:0.001] [6:0.429] % Reduction in Area: [3:59.10%] [5:91.50%] [6:-810.20%] % Reduction in Volume: 86.40% [5:88.90%] [6:-2581.20%] Classification: [3:Grade 2] [5:Grade 1] [6:Grade 2] Exudate Amount: [3:Large] [5:Small] [6:Medium] Exudate Type: [3:Serosanguineous] [5:Serous] [6:Serosanguineous] Exudate Color: [3:red, brown] [5:amber] [6:red, brown] Wound Margin: [3:Epibole] [5:Flat and Intact] [6:Flat and Intact] Granulation Amount: [3:Large (67-100%)] [5:Large (67-100%)] [6:Large (67-100%)] Granulation Quality: [3:Red] [5:Pink] [6:Pink, Pale, Hyper- granulation] Necrotic Amount: [3:None  Present (0%)] [5:None Present (0%)] [6:Small (1-33%)] Exposed Structures: [3:Fat Layer (Subcutaneous Tissue) Exposed: Yes Tendon: Yes Muscle: Yes Bone: Yes Fascia: No Joint: No Medium (34-66%)] [5:Fat Layer (Subcutaneous Tissue) Exposed: Yes Fascia: No Tendon: No Muscle: No Joint: No Bone: No Large (67-100%)] [6:Fat Layer  (Subcutaneous Tissue) Exposed: Yes Bone: Yes Fascia: No Tendon: No Muscle: No Joint: No Small (1-33%)] Treatment Notes Electronic Signature(s) Signed: 07/29/2019 8:02:09 AM By: Baltazar Najjarobson, Michael MD Entered By: Baltazar Najjarobson, Michael on 07/28/2019 14:18:59 -------------------------------------------------------------------------------- Multi-Disciplinary Care Plan Details Patient Name: Date of Service: Tillie FantasiaJOYCE, Meric D. 07/28/2019 1:15 PM Medical Record [id number] Patient Account Number: [id number] Date of Birth/Sex: Treating RN: [birth year] (59 y.o. [name] RightM) Dwiggins, Shannon Primary Care Keidra Withers: SYSTEM, PCP Other Clinician: Referring Makaelah Cranfield: Treating Aviyah Swetz/Extender:Robson, Willia CrazeMichael Moore, Angela Coxonald Weeks in Treatment: 10 Active Inactive Nutrition Nursing Diagnoses: Imbalanced nutrition Goals: Patient/caregiver will maintain therapeutic glucose control Date Initiated: 05/19/2019 Target Resolution Date: 08/18/2019 Goal Status: Active Interventions: Provide education on elevated blood sugars and impact on wound healing Notes: Wound/Skin Impairment Nursing Diagnoses: Impaired tissue integrity Goals: Ulcer/skin breakdown will have a volume reduction of 50% by week 8 Date Initiated: 05/19/2019 Target Resolution Date: 08/18/2019 Goal Status: Active Interventions: Provide education on ulcer and skin care Notes: Electronic Signature(s) Signed: 07/28/2019 5:41:24 PM By: Cherylin Mylarwiggins, Shannon Entered By: Cherylin Mylarwiggins, Shannon on 07/28/2019 13:57:18 -------------------------------------------------------------------------------- Pain Assessment Details Patient Name: Date of  Service: Erin FullingJOYCE, Akito D. 07/28/2019 1:15 PM Medical Record [id number] Patient Account Number: [id number] Date of Birth/Sex: Treating RN: [birth year] (59 y.o. Tammy SoursM) Deaton, Bobbi Primary Care Aengus Sauceda: SYSTEM, PCP Other Clinician: Referring Menashe Kafer: Treating Asjah Rauda/Extender:Robson, Willia CrazeMichael Moore, Angela Coxonald Weeks in Treatment: 10 Active Problems Location of Pain Severity and Description of Pain Patient Has Paino No Site Locations Rate the pain. Current Pain Level: 0 Pain Management and Medication Current Pain Management: Medication: No Cold Application: No Rest: No Massage: No Activity: No T.E.N.S.: No Heat Application: No Leg drop or elevation: No Is the Current Pain Management Adequate: Adequate How does your wound impact your activities of daily livingo Sleep: No Bathing: No Appetite: No Relationship With Others: No Bladder Continence: No Emotions: No Bowel Continence: No Work: No Toileting: No Drive: No Dressing: No Hobbies: No Electronic Signature(s) Signed: 07/28/2019 5:42:35 PM By: Shawn Stalleaton, Bobbi Entered By: Shawn Stalleaton, Bobbi on 07/28/2019 13:51:57 -------------------------------------------------------------------------------- Patient/Caregiver Education Details Patient Name: Date of Service: Norbeck, Divit D. 3/26/2021andnbsp1:15 PM Medical Record [id number] Patient Account Number: [id number] Date of Birth/Gender: [birth year] (58 y.o. M) Treating RN: Cherylin Mylarwiggins, Shannon Primary Care Physician: SYSTEM, PCP Other Clinician: Referring Physician: Treating Physician/Extender:Robson, Willia CrazeMichael Moore, Angela Coxonald Weeks in Treatment: 10 Education Assessment Education Provided To: Patient Education Topics Provided  Elevated Blood Sugar/ Impact on Healing: Methods: Explain/Verbal Responses: State content correctly Wound/Skin Impairment: Methods: Explain/Verbal Responses: State content correctly Electronic Signature(s) Signed: 07/28/2019 5:41:24 PM By: Kela Millin Entered By: Kela Millin on 07/28/2019 13:57:34 -------------------------------------------------------------------------------- Wound Assessment Details Patient Name: Date of Service: TYJON, BOWEN 07/28/2019 1:15 PM Medical Record [id number] Patient Account Number: [id number] Date of Birth/Sex: Treating RN: 1961/04/29 (59 y.o. Hessie Diener Primary Care Ayce Pietrzyk: SYSTEM, PCP Other Clinician: Referring Rinda Rollyson: Treating Maude Gloor/Extender:Robson, Ileene Rubens, Ermalene Searing in Treatment: 10 Wound Status Wound Number: 3 Primary Diabetic Wound/Ulcer of the Lower Extremity Etiology: Wound Location: Left Foot - Lateral Wound Open Wounding Event: Gradually Appeared Status: Status: Date Acquired: 03/05/2019 Comorbid Glaucoma, Coronary Artery Disease, Deep Weeks Of Treatment: 10 History: Vein Thrombosis, Hypertension, Peripheral Clustered Wound: No Arterial Disease, Type II Diabetes, Dementia, Neuropathy, Seizure Disorder Wound Measurements Length: (cm) 1.7 Width: (cm) 0.9 Depth: (cm) 0.6 Area: (cm) 1.202 Volume: (cm) 0.721 Wound Description Classification: Grade 2 Wound Margin: Epibole Exudate Amount: Large Exudate Type: Serosanguineous Exudate Color: red, brown Wound Bed Granulation Amount: Large (67-100%) Granulation Quality: Red Necrotic Amount: None Present (0%) After Cleansing: No rino No Exposed Structure osed: No (Subcutaneous Tissue) Exposed: Yes osed: Yes osed: Yes sis of Muscle: Yes sed: No ed: Yes % Reduction in Area: 59.1% % Reduction in Volume: 86.4% Epithelialization: Medium (34-66%) Tunneling: No Undermining: No Foul Odor Slough/Fib Fascia Exp Fat Layer Tendon Exp Muscle Exp Necro Joint Expo Bone Expos Treatment Notes Wound #3 (Left, Lateral Foot) 1. Cleanse With Wound Cleanser 3. Primary Dressing Applied Calcium Alginate Ag 4. Secondary Dressing Dry Gauze Roll Gauze Foam 5. Secured With Medipore  tape 7. Footwear/Offloading device applied Multipodus Splint/Boot Electronic Signature(s) Signed: 07/28/2019 5:42:35 PM By: Deon Pilling Entered By: Deon Pilling on 07/28/2019 13:55:13 -------------------------------------------------------------------------------- Wound Assessment Details Patient Name: Date of Service: MYLES, TAVELLA 07/28/2019 1:15 PM Medical Record [id number] Patient Account Number: [id number] Date of Birth/Sex: Treating RN: [birth year] (59 y.o. Hessie Diener Primary Care Teleshia Lemere: SYSTEM, PCP Other Clinician: Referring Dianne Whelchel: Treating Arlisha Patalano/Extender:Robson, Ileene Rubens, Ermalene Searing in Treatment: 10 Wound Status Wound Number: 5 Primary Diabetic Wound/Ulcer of the Lower Extremity Etiology: Wound Location: Left Toe Fourth Wound Open Wounding Event: Gradually Appeared Status: Date Acquired: 06/30/2019 Comorbid Glaucoma, Coronary Artery Disease, Deep Weeks Of Treatment: 4 History: Vein Thrombosis, Hypertension, Peripheral Clustered Wound: No Arterial Disease, Type II Diabetes, Dementia, Neuropathy, Seizure Disorder Wound Measurements Length: (cm) 0.1 Width: (cm) 0.1 Depth: (cm) 0.1 Area: (cm) 0.008 Volume: (cm) 0.001 Wound Description Classification: Grade 1 Wound Margin: Flat and Intact Exudate Amount: Small Exudate Type: Serous Exudate Color: amber Wound Bed Granulation Amount: Large (67-100%) Granulation Quality: Pink Necrotic Amount: None Present (0%) After Cleansing: No rino No Exposed Structure osed: No (Subcutaneous Tissue) Exposed: Yes osed: No osed: No sed: No ed: No % Reduction in Area: 91.5% % Reduction in Volume: 88.9% Epithelialization: Large (67-100%) Tunneling: No Undermining: No Foul Odor Slough/Fib Fascia Exp Fat Layer Tendon Exp Muscle Exp Joint Expo Bone Expos Treatment Notes Wound #5 (Left Toe Fourth) 1. Cleanse With Wound Cleanser 3. Primary Dressing Applied Calcium Alginate Ag 4.  Secondary Dressing Dry Gauze Roll Gauze Foam 5. Secured With Medipore tape 7. Footwear/Offloading device applied Multipodus Splint/Boot Electronic Signature(s) Signed: 07/28/2019 5:42:35 PM By: Deon Pilling Entered By: Deon Pilling on 07/28/2019 13:57:23 -------------------------------------------------------------------------------- Wound Assessment Details Patient Name: Date of Service: DAVIONNE, DOWTY 07/28/2019 1:15 PM Medical Record [id number] Patient Account Number: [id number] Date of Birth/Sex: Treating RN: 1960-05-10 (58  y.o. [name] Primary Care Lalaine Overstreet: SYSTEM, PCP Other Clinician: Referring Elianna Windom: Treating Mayanna Garlitz/Extender:Robson, Willia Craze, Angela Cox in Treatment: 10 Wound Status Wound Number: 6 Primary Diabetic Wound/Ulcer of the Lower Extremity Etiology: Wound Location: Left Toe Third Wound Open Wounding Event: Gradually Appeared Status: Date Acquired: 06/30/2019 Comorbid Glaucoma, Coronary Artery Disease, Deep Weeks Of Treatment: 4 History: Vein Thrombosis, Hypertension, Peripheral Clustered Wound: No Arterial Disease, Type II Diabetes, Dementia, Neuropathy, Seizure Disorder Wound Measurements Length: (cm) 1.4 % Reductio Width: (cm) 1.3 % Reductio Depth: (cm) 0.3 Epithelial Area: (cm) 1.429 Tunneling Volume: (cm) 0.429 Undermini Wound Description Classification: Grade 2 Foul Odor Wound Margin: Flat and Intact Slough/Fib Exudate Amount: Medium Exudate Type: Serosanguineous Exudate Color: red, brown Wound Bed Granulation Amount: Large (67-100%) Granulation Quality: Pink, Pale, Hyper-granulation Fascia Exp Necrotic Amount: Small (1-33%) Fat Layer Necrotic Quality: Adherent Slough Tendon Exp Muscle Exp Joint Expo Bone Expos After Cleansing: No rino Yes Exposed Structure osed: No (Subcutaneous Tissue) Exposed: Yes osed: No osed: No sed: No ed: Yes n in Area: -810.2% n in Volume: -2581.2% ization: Small  (1-33%) : No ng: No Treatment Notes Wound #6 (Left Toe Third) 1. Cleanse With Wound Cleanser 3. Primary Dressing Applied Calcium Alginate Ag 4. Secondary Dressing Dry Gauze Roll Gauze Foam 5. Secured With Medipore tape 7. Footwear/Offloading device applied Multipodus Splint/Boot Electronic Signature(s) Signed: 07/28/2019 5:42:35 PM By: Shawn Stall Entered By: Shawn Stall on 07/28/2019 13:56:22 -------------------------------------------------------------------------------- Vitals Details Patient Name: Date of Service: [name] 07/28/2019 1:15 PM Medical Record [id number] Patient Account Number: [id number] Date of Birth/Sex: Treating RN: 11-15-1960 (59 y.o. [name] Primary Care Michele Judy: SYSTEM, PCP Other Clinician: Referring Kariana Wiles: Treating Datron Brakebill/Extender:Robson, Willia Craze, Angela Cox in Treatment: 10 Vital Signs Time Taken: 13:48 Temperature (F): 98.6 Height (in): 73 Pulse (bpm): 80 Weight (lbs): 207 Respiratory Rate (breaths/min): 16 Body Mass Index (BMI): 27.3 Blood Pressure (mmHg): 128/59 Reference Range: 80 - 120 mg / dl Electronic Signature(s) Signed: 07/28/2019 5:42:35 PM By: Shawn Stall Entered By: Shawn Stall on 07/28/2019 13:51:47

## 2019-08-02 LAB — AEROBIC/ANAEROBIC CULTURE W GRAM STAIN (SURGICAL/DEEP WOUND): Gram Stain: NONE SEEN

## 2019-08-02 LAB — AEROBIC/ANAEROBIC CULTURE (SURGICAL/DEEP WOUND)

## 2019-08-11 ENCOUNTER — Other Ambulatory Visit: Payer: Self-pay

## 2019-08-11 ENCOUNTER — Encounter (HOSPITAL_BASED_OUTPATIENT_CLINIC_OR_DEPARTMENT_OTHER): Payer: Medicare Other | Attending: Internal Medicine | Admitting: Internal Medicine

## 2019-08-11 DIAGNOSIS — E1169 Type 2 diabetes mellitus with other specified complication: Secondary | ICD-10-CM | POA: Insufficient documentation

## 2019-08-11 DIAGNOSIS — G40909 Epilepsy, unspecified, not intractable, without status epilepticus: Secondary | ICD-10-CM | POA: Insufficient documentation

## 2019-08-11 DIAGNOSIS — I10 Essential (primary) hypertension: Secondary | ICD-10-CM | POA: Insufficient documentation

## 2019-08-11 DIAGNOSIS — L97521 Non-pressure chronic ulcer of other part of left foot limited to breakdown of skin: Secondary | ICD-10-CM | POA: Insufficient documentation

## 2019-08-11 DIAGNOSIS — E1151 Type 2 diabetes mellitus with diabetic peripheral angiopathy without gangrene: Secondary | ICD-10-CM | POA: Insufficient documentation

## 2019-08-11 DIAGNOSIS — F329 Major depressive disorder, single episode, unspecified: Secondary | ICD-10-CM | POA: Diagnosis not present

## 2019-08-11 DIAGNOSIS — E11621 Type 2 diabetes mellitus with foot ulcer: Secondary | ICD-10-CM | POA: Insufficient documentation

## 2019-08-11 DIAGNOSIS — M86472 Chronic osteomyelitis with draining sinus, left ankle and foot: Secondary | ICD-10-CM | POA: Diagnosis not present

## 2019-08-11 DIAGNOSIS — E785 Hyperlipidemia, unspecified: Secondary | ICD-10-CM | POA: Insufficient documentation

## 2019-08-11 DIAGNOSIS — L97524 Non-pressure chronic ulcer of other part of left foot with necrosis of bone: Secondary | ICD-10-CM | POA: Diagnosis present

## 2019-08-11 DIAGNOSIS — E1140 Type 2 diabetes mellitus with diabetic neuropathy, unspecified: Secondary | ICD-10-CM | POA: Insufficient documentation

## 2019-08-14 NOTE — Progress Notes (Signed)
[name], [name] ([id number]) Visit Report for 08/11/2019 Arrival Information Details Patient Name: Date of Service: [name], SALING 08/11/2019 1:00 PM Medical Record [id number] Patient Account Number: [id number] Date of Birth/Sex: Treating RN: [birth year] (59 y.o. [name] Primary Care Latiqua Daloia: SYSTEM, PCP Other Clinician: Referring Abril Cappiello: Treating Tanasia Budzinski/Extender:Robson, Willia Craze, Angela Cox in Treatment: 12 Visit Information History Since Last Visit Added or deleted any medications: No Patient Arrived: Wheel Chair Any new allergies or adverse reactions: No Arrival Time: 13:26 Had a fall or experienced change in No Accompanied By: SNF staff activities of daily living that may affect Transfer Assistance: None risk of falls: Patient Identification Verified: Yes Signs or symptoms of abuse/neglect since last No Secondary Verification Process Yes visito Completed: Hospitalized since last visit: No Patient Requires Transmission-Based No Implantable device outside of the clinic excluding No Precautions: cellular tissue based products placed in the center Patient Has Alerts: Yes since last visit: Patient Alerts: R and L ABIs Has Dressing in Place as Prescribed: Yes N/C Pain Present Now: No Electronic Signature(s) Signed: 08/14/2019 6:12:10 PM By: Frank Abts RN, BSN Entered By: [name] on 08/11/2019 13:26:45 -------------------------------------------------------------------------------- Encounter Discharge Information Details Patient Name: Date of Service: [name] D. 08/11/2019 1:00 PM Medical Record [id number] Patient Account Number: [id number] Date of Birth/Sex: Treating RN: 01-18-1961 (59 y.o. [name] Primary Care [name]: SYSTEM, PCP Other Clinician: Referring Kamesha Herne: Treating Yaretzi Ernandez/Extender:Robson, Willia Craze, Angela Cox in Treatment: 12 Encounter Discharge Information Items Post Procedure  Vitals Discharge Condition: Stable Temperature (F): 99 Ambulatory Status: Wheelchair Pulse (bpm): 82 Discharge Destination: Skilled Nursing Facility Respiratory Rate (breaths/min): 18 Telephoned: No Blood Pressure (mmHg): 133/66 Orders Sent: Yes Transportation: Private Auto Accompanied By: caregiver Schedule Follow-up Appointment: Yes Clinical Summary of Care: Electronic Signature(s) Signed: 08/11/2019 5:55:58 PM By: [name] Entered By: [name] on 08/11/2019 15:26:36 -------------------------------------------------------------------------------- Lower Extremity Assessment Details Patient Name: Date of Service: [name], DAMAN 08/11/2019 1:00 PM Medical Record [id number] Patient Account Number: [id number] Date of Birth/Sex: Treating RN: 1961-01-04 (59 y.o. [name] Primary Care Markiah Janeway: SYSTEM, PCP Other Clinician: Referring Suzy Kugel: Treating Adanely Reynoso/Extender:Robson, Willia Craze, Angela Cox in Treatment: 12 Edema Assessment Assessed: [Left: No] [[name]: No] Edema: [Left: No] [[name]: No] Calf Left: [name]: Point of Measurement: cm From Medial Instep 28 cm cm Ankle Left: [name]: Point of Measurement: cm From Medial Instep 18.7 cm cm Vascular Assessment Pulses: Dorsalis Pedis Palpable: [Left:Yes] Electronic Signature(s) Signed: 08/14/2019 6:12:10 PM By: Frank Abts RN, BSN Entered By: [name] on 08/11/2019 13:32:20 -------------------------------------------------------------------------------- Multi Wound Chart Details Patient Name: Date of Service: [name] D. 08/11/2019 1:00 PM Medical Record [id number] Patient Account Number: [id number] Date of Birth/Sex: Treating RN: Jan 23, 1961 (59 y.o. 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] [5:Lower Extremity] [6:Lower Extremity] Comorbid History: [3:Glaucoma, Coronary Artery Glaucoma, Coronary Artery Glaucoma, Coronary Artery Disease, Deep Vein Thrombosis, Hypertension, Thrombosis, Hypertension, Thrombosis, Hypertension, Peripheral Arterial Disease, Peripheral Arterial Disease,  Peripheral Arterial Disease, Type II Diabetes, Dementia, Type II Diabetes, Dementia, Type II Diabetes, Dementia, Neuropathy, Seizure Disorder] [5:Disease, Deep Vein Neuropathy, Seizure  Disorder] [6:Disease, Deep Vein Neuropathy, Seizure Disorder] Date Acquired: [3:03/05/2019] [5:06/30/2019] [6:06/30/2019] Weeks of Treatment: [3:12] [5:6] [6:6] Wound Status: [3:Open] [5:Healed - Epithelialized] [6:Open] Measurements L x W x D 1.5x0.5x1 [5:0x0x0] [6:0.7x1.5x0.2] (cm) Area (cm) : [3:0.589] [5:0] [6:0.825] Volume (cm) : [3:0.589] [5:0] [6:0.165] % Reduction in Area: [3:79.90%] [5:100.00%] [6:-425.50%] % Reduction in Volume: 88.90% [5:100.00%] [6:-931.20%] Starting Position 1 10 (o'clock): Ending Position 1 [3:1] (o'clock): Maximum Distance 1 [3:0.3] (cm): Undermining: [3:Yes] [5:No] [6:No] Classification: [3:Grade 2] [5:Grade 1] [6:Grade 2] Exudate Amount: [3:Large] [5:None Present] [6:Medium] Exudate Type: [3:Serosanguineous] [5:N/A] [6:Serosanguineous] Exudate Color: [3:red, brown] [5:N/A] [6:red, brown] Wound Margin: [3:Epibole] [5:Flat and Intact] [6:Flat and Intact] Granulation Amount: [3:Large (67-100%)] [5:None Present (0%)] [6:Medium  (34-66%)] Granulation Quality: [3:Red] [5:N/A] [6:Pink] Necrotic Amount: [3:None Present (0%)] [5:None Present (0%)] [6:Medium (34-66%)] Exposed Structures: [3:Fat Layer (Subcutaneous Fascia: No Tissue) Exposed: Yes Fascia: No Tendon: No Muscle: No Joint: No Bone: No] [5:Fat Layer (Subcutaneous Tissue) Exposed: Yes Tissue) Exposed: No Tendon: No Muscle: No Joint: No Bone: No] [6:Fat Layer (Subcutaneous  Fascia: No Tendon: No Muscle: No Joint: No Bone: No] Epithelialization: [3:Medium (34-66%)] [5:Large (67-100%)] [6:Small (1-33%)] Debridement: [3:N/A] [5:N/A] [6:Debridement - Excisional] Pre-procedure [3:N/A] [5:N/A] [6:13:45] Verification/Time Out Taken: Pain Control: [3:N/A] [5:N/A] [6:Other] Tissue Debrided: [3:N/A] [5:N/A] [6:Subcutaneous, Slough] Level: [3:N/A] [5:N/A] [6:Skin/Subcutaneous Tissue] Debridement Area (sq cm):N/A [5:N/A] [6:1.05] Instrument: [3:N/A] [5:N/A] [6:Curette] Bleeding: [3:N/A] [5:N/A] [6:Minimum] Hemostasis Achieved: [3:N/A] [5:N/A] [6:Pressure] Procedural Pain: [3:N/A] [5:N/A] [6:0] Post Procedural Pain: [3:N/A] [5:N/A] [6:0] Debridement Treatment N/A [5:N/A] [6:Procedure was tolerated] Response: [6:well] Post Debridement [3:N/A] [5:N/A] [6:0.7x1.5x0.2] Measurements L x W x D (cm) Post Debridement [3:N/A] [5:N/A] [6:0.165] Volume: (cm) Procedures Performed: N/A [5:N/A] [6:Debridement] Treatment Notes Electronic Signature(s) Signed: 08/11/2019 6:04:52 PM By: Kela Millin Signed: 08/14/2019 9:07:11 AM By: Linton Ham MD Entered By: Linton Ham on 08/11/2019 14:01:41 -------------------------------------------------------------------------------- Multi-Disciplinary Care Plan Details Patient Name: Date of Service: Curt Bears D. 08/11/2019 1:00 PM Medical Record [id number] Patient Account Number: [id number] Date of Birth/Sex: Treating RN: 1960-08-13 (59 y.o. [name] Primary Care Garnell Phenix: SYSTEM, PCP Other  Clinician: Referring Matthe Sloane: Treating Kashay Cavenaugh/Extender:Robson, Ileene Rubens, Ermalene Searing in Treatment: 12 Active Inactive Nutrition Nursing Diagnoses: Imbalanced nutrition Goals: Patient/caregiver will maintain therapeutic glucose control Date Initiated: 05/19/2019 Target Resolution Date: 08/18/2019 Goal Status: Active Interventions: Provide education on elevated blood sugars and impact on wound healing Notes: Wound/Skin Impairment Nursing Diagnoses: Impaired tissue integrity Goals: Ulcer/skin breakdown will have a volume reduction of 50% by week 8 Date Initiated: 05/19/2019 Target Resolution Date: 08/18/2019 Goal Status: Active Interventions: Provide education on ulcer and skin care Notes: Electronic Signature(s) Signed: 08/11/2019 6:04:52 PM By: Kela Millin Entered By: Kela Millin on 08/11/2019 13:35:54 -------------------------------------------------------------------------------- Pain Assessment Details Patient Name: Date of Service: [name], BOROWIAK 08/11/2019 1:00 PM Medical Record [id number] Patient Account Number: [id number] Date of Birth/Sex: Treating RN: 1960/07/09 (59 y.o. [name] Primary Care Juanda Luba: SYSTEM, PCP Other Clinician: Referring Bijon Mineer: Treating Lareen Mullings/Extender:Robson, Ileene Rubens, Ermalene Searing in Treatment: 12 Active Problems Location of Pain Severity and Description of Pain Patient Has Paino No Site Locations Pain Management and Medication Current Pain Management: Electronic Signature(s) Signed: 08/14/2019 6:12:10 PM By: Levan Hurst RN, BSN Entered By: Levan Hurst on 08/11/2019 13:27:04 -------------------------------------------------------------------------------- Patient/Caregiver Education Details Patient Name: Date of Service: [name], Frank D. 4/9/2021andnbsp1:00 PM Medical Record [id number] Patient Account Number: [id number] Date of Birth/Gender: [birth year] (58 y.o. M) Treating RN:  Kela Millin Primary Care Physician: SYSTEM, PCP Other Clinician: Referring Physician: Treating Physician/Extender:Robson, Ileene Rubens, Ermalene Searing  in Treatment: 12 Education Assessment Education Provided To: Patient Education Topics Provided Elevated Blood Sugar/ Impact on Healing: Methods: Explain/Verbal Responses: State content correctly Wound/Skin Impairment: Methods: Explain/Verbal Responses: State content correctly Electronic Signature(s) Signed: 08/11/2019 6:04:52 PM By: Cherylin Mylar Entered By: Cherylin Mylar on 08/11/2019 13:36:09 -------------------------------------------------------------------------------- Wound Assessment Details Patient Name: Date of Service: [name] D. 08/11/2019 1:00 PM Medical Record [id number] Patient Account Number: [id number] Date of Birth/Sex: Treating RN: [birth year] (59 y.o. [name] Primary Care Eris Hannan: SYSTEM, PCP Other Clinician: Referring Seab Axel: Treating Hodges Treiber/Extender:Robson, Willia Craze, Angela Cox in Treatment: 12 Wound Status Wound Number: 3 Primary Diabetic Wound/Ulcer of the Lower Extremity Etiology: Wound Location: Left, Lateral Foot Wound Open Wounding Event: Gradually Appeared Status: Date Acquired: 03/05/2019 Comorbid Glaucoma, Coronary Artery Disease, Deep Weeks Of Treatment: 12 History: Vein Thrombosis, Hypertension, Peripheral Clustered Wound: No Arterial Disease, Type II Diabetes, Dementia, Neuropathy, Seizure Disorder Wound Measurements Length: (cm) 1.5 Width: (cm) 0.5 Depth: (cm) 1 Area: (cm) 0.589 Volume: (cm) 0.589 % Reduction in Area: 79.9% % Reduction in Volume: 88.9% Epithelialization: Medium (34-66%) Tunneling: No Undermining: Yes Starting Position (o'clock): 10 Ending Position (o'clock): 1 Maximum Distance: (cm) 0.3 Wound Description Classification: Grade 2 Foul Odor A Wound Margin: Epibole Slough/Fibr Exudate Amount: Large Exudate Type:  Serosanguineous Exudate Color: red, brown Wound Bed Granulation Amount: Large (67-100%) Granulation Quality: Red Fascia Expo Necrotic Amount: None Present (0%) Fat Layer ( Tendon Expo Muscle Expo Joint Expos Bone Expose fter Cleansing: No ino No Exposed Structure sed: No Subcutaneous Tissue) Exposed: Yes sed: No sed: No ed: No d: No Treatment Notes Wound #3 (Left, Lateral Foot) 1. Cleanse With Wound Cleanser 3. Primary Dressing Applied Calcium Alginate Ag 4. Secondary Dressing Dry Gauze Roll Gauze Foam 5. Secured With Medipore tape 7. Footwear/Offloading device applied Multipodus Splint/Boot Electronic Signature(s) Signed: 08/14/2019 6:12:10 PM By: Frank Abts RN, BSN Entered By: [name] on 08/11/2019 13:28:44 -------------------------------------------------------------------------------- Wound Assessment Details Patient Name: Date of Service: [name] D. 08/11/2019 1:00 PM Medical Record [id number] Patient Account Number: [id number] Date of Birth/Sex: Treating RN: 1960/09/11 (59 y.o. [name] Primary Care Hesham Womac: SYSTEM, PCP Other Clinician: Referring Maybel Dambrosio: Treating Natalija Mavis/Extender:Robson, Willia Craze, Angela Cox in Treatment: 12 Wound Status Wound Number: 5 Primary Diabetic Wound/Ulcer of the Lower Extremity Etiology: Wound Location: Left Toe Fourth Wound Healed - Epithelialized Wounding Event: Gradually Appeared Status: Date Acquired: 06/30/2019 Comorbid Glaucoma, Coronary Artery Disease, Deep Weeks Of Treatment: 6 History: Vein Thrombosis, Hypertension, Peripheral Clustered Wound: No Arterial Disease, Type II Diabetes, Dementia, Neuropathy, Seizure Disorder Wound Measurements Length: (cm) 0 Width: (cm) 0 Depth: (cm) 0 Area: (cm) 0 Volume: (cm) 0 Wound Description Classification: Grade 1 Wound Margin: Flat and Intact Exudate Amount: None Present Wound Bed Granulation Amount: None Present  (0%) Necrotic Amount: None Present (0%) fter Cleansing: No ino No Exposed Structure sed: No Subcutaneous Tissue) Exposed: No sed: No sed: No ed: No d: No % Reduction in Area: 100% % Reduction in Volume: 100% Epithelialization: Large (67-100%) Tunneling: No Undermining: No Foul Odor A Slough/Fibr Fascia Expo Fat Layer ( Tendon Expo Muscle Expo Joint Expos Bone Expose Electronic Signature(s) Signed: 08/14/2019 6:12:10 PM By: Frank Abts RN, BSN Entered By: [name] on 08/11/2019 13:30:17 -------------------------------------------------------------------------------- Wound Assessment Details Patient Name: Date of Service: [name] D. 08/11/2019 1:00 PM Medical Record [id number] Patient Account Number: [id number] Date of Birth/Sex: Treating RN: [birth year] (59 y.o. [name] Primary Care Danija Gosa: Other Clinician: SYSTEM, PCP Referring Reginna Sermeno: Treating Jermine Bibbee/Extender:Robson, Willia Craze, Angela Cox  in Treatment: 12 Wound Status Wound Number: 6 Primary Diabetic Wound/Ulcer of the Lower Extremity Etiology: Wound Location: Left Toe Third Wound Open Wounding Event: Gradually Appeared Status: Date Acquired: 06/30/2019 Comorbid Glaucoma, Coronary Artery Disease, Deep Weeks Of Treatment: 6 History: Vein Thrombosis, Hypertension, Peripheral Clustered Wound: No Arterial Disease, Type II Diabetes, Dementia, Neuropathy, Seizure Disorder Wound Measurements Length: (cm) 0.7 Width: (cm) 1.5 Depth: (cm) 0.2 Area: (cm) 0.825 Volume: (cm) 0.165 Wound Description Classification: Grade 2 Wound Margin: Flat and Intact Exudate Amount: Medium Exudate Type: Serosanguineous Exudate Color: red, brown Wound Bed Granulation Amount: Medium (34-66%) Granulation Quality: Pink Necrotic Amount: Medium (34-66%) Necrotic Quality: Adherent Slough fter Cleansing: No ino Yes Exposed Structure sed: No Subcutaneous Tissue) Exposed: Yes sed: No sed:  No ed: No d: No % Reduction in Area: -425.5% % Reduction in Volume: -931.2% Epithelialization: Small (1-33%) Tunneling: No Undermining: No Foul Odor A Slough/Fibr Fascia Expo Fat Layer ( Tendon Expo Muscle Expo Joint Expos Bone Expose Treatment Notes Wound #6 (Left Toe Third) 1. Cleanse With Wound Cleanser 3. Primary Dressing Applied Calcium Alginate Ag 4. Secondary Dressing Dry Gauze Roll Gauze Foam 5. Secured With Medipore tape 7. Footwear/Offloading device applied Multipodus Splint/Boot Electronic Signature(s) Signed: 08/14/2019 6:12:10 PM By: Frank Abts RN, BSN Entered By: [name] on 08/11/2019 13:29:53 -------------------------------------------------------------------------------- Vitals Details Patient Name: Date of Service: [name] D. 08/11/2019 1:00 PM Medical Record [id number] Patient Account Number: [id number] Date of Birth/Sex: Treating RN: Jun 19, 1960 (59 y.o. [name] Primary Care Shulamit Donofrio: SYSTEM, PCP Other Clinician: Referring Kylar Leonhardt: Treating Kinslea Frances/Extender:Robson, Willia Craze, Angela Cox in Treatment: 12 Vital Signs Time Taken: 13:26 Temperature (F): 99.0 Height (in): 73 Pulse (bpm): 82 Weight (lbs): 207 Respiratory Rate (breaths/min): 18 Body Mass Index (BMI): 27.3 Blood Pressure (mmHg): 133/66 Reference Range: 80 - 120 mg / dl Electronic Signature(s) Signed: 08/14/2019 6:12:10 PM By: Frank Abts RN, BSN Entered By: [name] on 08/11/2019 13:26:59

## 2019-08-14 NOTE — Progress Notes (Signed)
Frank, [name] ([id number]) Visit Report for 08/11/2019 Debridement Details Patient Name: Date of Service: [name], [name] 08/11/2019 1:00 PM Medical Record [id number] Patient Account Number: [id number] Date of Birth/Sex: Treating RN: [birth year] (59 y.o. [name] Primary Care Provider: SYSTEM, PCP Other Clinician: Referring Provider: Treating Provider/Extender:Lissandra Keil, Willia Craze, Angela Cox in Treatment: 12 Debridement Performed for Wound #6 Left Toe Third Assessment: Performed By: Physician Maxwell Caul., MD Debridement Type: Debridement Severity of Tissue Pre Fat layer exposed Debridement: Level of Consciousness (Pre- Awake and Alert procedure): Pre-procedure Verification/Time Out Taken: Yes - 13:45 Start Time: 13:45 Pain Control: Other : benzocaine, 20% Total Area Debrided (L x W): 0.7 (cm) x 1.5 (cm) = 1.05 (cm) Tissue and other material Viable, Non-Viable, Slough, Subcutaneous, Slough debrided: Level: Skin/Subcutaneous Tissue Debridement Description: Excisional Instrument: Curette Bleeding: Minimum Hemostasis Achieved: Pressure End Time: 13:46 Procedural Pain: 0 Post Procedural Pain: 0 Response to Treatment: Procedure was tolerated well Level of Consciousness Awake and Alert (Post-procedure): Post Debridement Measurements of Total Wound Length: (cm) 0.7 Width: (cm) 1.5 Depth: (cm) 0.2 Volume: (cm) 0.165 Character of Wound/Ulcer Post Improved Debridement: Severity of Tissue Post Debridement: Fat layer exposed Post Procedure Diagnosis Same as Pre-procedure Electronic Signature(s) Signed: 08/11/2019 6:04:52 PM By: Cherylin Mylar Signed: 08/14/2019 9:07:11 AM By: Baltazar Najjar MD Entered By: Baltazar Najjar on 08/11/2019 14:02:10 -------------------------------------------------------------------------------- Physical Exam Details Patient Name: Date of Service: Frank Fantasia D. 08/11/2019 1:00 PM Medical Record [id number]  Patient Account Number: [id number] Date of Birth/Sex: Treating RN: 01-06-1961 (59 y.o. [name] Primary Care Provider: SYSTEM, PCP Other Clinician: Referring Provider: Treating Provider/Extender:Nydia Ytuarte, Willia Craze, Angela Cox in Treatment: 12 Constitutional Sitting or standing Blood Pressure is within target range for patient.. Pulse regular and within target range for patient.Marland Kitchen Respirations regular, non-labored and within target range.. Temperature is normal and within the target range for the patient.Marland Kitchen Appears in no distress. Notes Wound exam; left lateral foot looks healthy. There is no exposed bone The area on the left fourth toe is healed today Medial part of the third toe required debridement with a #3 curette necrotic surface debris hemostasis with direct pressure. The toe itself looks a lot better is a lot less swollen Electronic Signature(s) Signed: 08/14/2019 9:07:11 AM By: Baltazar Najjar MD Entered By: Baltazar Najjar on 08/11/2019 14:03:09 -------------------------------------------------------------------------------- Physician Orders Details Patient Name: Date of Service: Frank Fantasia D. 08/11/2019 1:00 PM Medical Record [id number] Patient Account Number: [id number] Date of Birth/Sex: Treating RN: October 27, 1960 (59 y.o. [name] Primary Care Provider: SYSTEM, PCP Other Clinician: Referring Provider: Treating Provider/Extender:Oracio Galen, Willia Craze, Angela Cox in Treatment: 12 Verbal / Phone Orders: No Diagnosis Coding ICD-10 Coding Code Description E11.621 Type 2 diabetes mellitus with foot ulcer L97.524 Non-pressure chronic ulcer of other part of left foot with necrosis of bone E11.51 Type 2 diabetes mellitus with diabetic peripheral angiopathy without gangrene L97.521 Non-pressure chronic ulcer of other part of left foot limited to breakdown of skin Follow-up Appointments Return appointment in 3 weeks. - Friday Dressing Change  Frequency Wound #3 Left,Lateral Foot Change dressing every day. Wound #6 Left Toe Third Change dressing every day. Skin Barriers/Peri-Wound Care Skin Prep Wound Cleansing Wound #3 Left,Lateral Foot Clean wound with Wound Cleanser Wound #6 Left Toe Third Clean wound with Wound Cleanser Primary Wound Dressing Wound #3 Left,Lateral Foot Calcium Alginate with Silver Wound #6 Left Toe Third Calcium Alginate with Silver Foam - use to separate 3rd and 4th toes Secondary Dressing Wound #3 Left,Lateral Foot Kerlix/Rolled Gauze Dry  Gauze Wound #6 Left Toe Third Kerlix/Rolled Gauze Dry Gauze Off-Loading Multipodus Splint to: - both feet Electronic Signature(s) Signed: 08/11/2019 6:04:52 PM By: Cherylin Mylar Signed: 08/14/2019 9:07:11 AM By: Baltazar Najjar MD Entered By: Cherylin Mylar on 08/11/2019 13:46:43 -------------------------------------------------------------------------------- Problem List Details Patient Name: Date of Service: Frank Fantasia D. 08/11/2019 1:00 PM Medical Record [id number] Patient Account Number: [id number] Date of Birth/Sex: Treating RN: 16-Sep-1960 (59 y.o. [name] Primary Care Provider: SYSTEM, PCP Other Clinician: Referring Provider: Treating Provider/Extender:Shayleigh Bouldin, Willia Craze, Angela Cox in Treatment: 12 Active Problems ICD-10 Evaluated Encounter Code Description Active Date Today Diagnosis E11.621 Type 2 diabetes mellitus with foot ulcer 05/19/2019 No Yes L97.524 Non-pressure chronic ulcer of other part of left foot 05/19/2019 No Yes with necrosis of bone M86.472 Chronic osteomyelitis with draining sinus, left ankle 08/11/2019 No Yes and foot E11.51 Type 2 diabetes mellitus with diabetic peripheral 05/19/2019 No Yes angiopathy without gangrene L97.521 Non-pressure chronic ulcer of other part of left foot 06/30/2019 No Yes limited to breakdown of skin Inactive Problems ICD-10 Code Description Active Date Inactive  Date L97.514 Non-pressure chronic ulcer of other part of [name] foot with 05/19/2019 05/19/2019 necrosis of bone Resolved Problems Electronic Signature(s) Signed: 08/14/2019 9:07:11 AM By: Baltazar Najjar MD Entered By: Baltazar Najjar on 08/11/2019 14:01:31 -------------------------------------------------------------------------------- Progress Note Details Patient Name: Date of Service: Frank Fantasia D. 08/11/2019 1:00 PM Medical Record [id number] Patient Account Number: [id number] Date of Birth/Sex: Treating RN: 07-Jul-1960 (59 y.o. [name] Primary Care Provider: SYSTEM, PCP Other Clinician: Referring Provider: Treating Provider/Extender:Garv Kuechle, Willia Craze, Angela Cox in Treatment: 12 Subjective Objective Constitutional Sitting or standing Blood Pressure is within target range for patient.. Pulse regular and within target range for patient.Marland Kitchen Respirations regular, non-labored and within target range.. Temperature is normal and within the target range for the patient.Marland Kitchen Appears in no distress. Vitals Time Taken: 1:26 PM, Height: 73 in, Weight: 207 lbs, BMI: 27.3, Temperature: 99.0 F, Pulse: 82 bpm, Respiratory Rate: 18 breaths/min, Blood Pressure: 133/66 mmHg. General Notes: Wound exam; left lateral foot looks healthy. There is no exposed bone ooThe area on the left fourth toe is healed today ooMedial part of the third toe required debridement with a #3 curette necrotic surface debris hemostasis with direct pressure. The toe itself looks a lot better is a lot less swollen Integumentary (Hair, Skin) Wound #3 status is Open. Original cause of wound was Gradually Appeared. The wound is located on the Left,Lateral Foot. The wound measures 1.5cm length x 0.5cm width x 1cm depth; 0.589cm^2 area and 0.589cm^3 volume. There is Fat Layer (Subcutaneous Tissue) Exposed exposed. There is no tunneling noted, however, there is undermining starting at 10:00 and ending at 1:00  with a maximum distance of 0.3cm. There is a large amount of serosanguineous drainage noted. The wound margin is epibole. There is large (67-100%) red granulation within the wound bed. There is no necrotic tissue within the wound bed. Wound #5 status is Healed - Epithelialized. Original cause of wound was Gradually Appeared. The wound is located on the Left Toe Fourth. The wound measures 0cm length x 0cm width x 0cm depth; 0cm^2 area and 0cm^3 volume. There is no tunneling or undermining noted. There is a none present amount of drainage noted. The wound margin is flat and intact. There is no granulation within the wound bed. There is no necrotic tissue within the wound bed. Wound #6 status is Open. Original cause of wound was Gradually Appeared. The wound is located on the Left Toe  Third. The wound measures 0.7cm length x 1.5cm width x 0.2cm depth; 0.825cm^2 area and 0.165cm^3 volume. There is Fat Layer (Subcutaneous Tissue) Exposed exposed. There is no tunneling or undermining noted. There is a medium amount of serosanguineous drainage noted. The wound margin is flat and intact. There is medium (34-66%) pink granulation within the wound bed. There is a medium (34-66%) amount of necrotic tissue within the wound bed including Adherent Slough. 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 There was a Excisional Skin/Subcutaneous Tissue Debridement with a total area of 1.05 sq cm  performed by Ricard Dillon., MD. With the following instrument(s): Curette to remove Viable and Non-Viable tissue/material. Material removed includes Subcutaneous Tissue and Slough and after achieving pain control using Other (benzocaine, 20%). No specimens were taken. A time out was conducted at 13:45, prior to the start of the procedure. A Minimum amount of bleeding was controlled with Pressure. The procedure was tolerated well with a pain level of 0 throughout and a pain level of 0 following the procedure. Post Debridement Measurements: 0.7cm length x 1.5cm width x 0.2cm depth; 0.165cm^3 volume. Character of Wound/Ulcer Post Debridement is improved. Severity of Tissue Post Debridement is: Fat layer exposed. Post procedure Diagnosis Wound #6: Same as Pre-Procedure Plan Follow-up Appointments: Return appointment in 3 weeks. - Friday Dressing Change Frequency: Wound #3 Left,Lateral Foot: Change dressing every day. Wound #6 Left Toe Third: Change dressing every day. Skin Barriers/Peri-Wound Care: Skin Prep Wound Cleansing: Wound #3 Left,Lateral Foot: Clean wound with Wound Cleanser Wound #6 Left Toe Third: Clean wound with Wound Cleanser Primary Wound Dressing: Wound #3 Left,Lateral Foot: Calcium Alginate with Silver Wound #6 Left Toe Third: Calcium Alginate with Silver Foam - use to separate 3rd and 4th toes Secondary Dressing: Wound #3 Left,Lateral Foot: Kerlix/Rolled Gauze Dry Gauze Wound #6 Left Toe Third: Kerlix/Rolled Gauze Dry Gauze Off-Loading: Multipodus Splint to: - both feet #1. Continue silver alginate to the left lateral foot and left third toe. #2. Considerable improvement in left third toe #3. We are able to verify that the patient is on vancomycin I think this was ordered for a month. The swelling and erythema of the toe is considerably better. #4. Follow-up 3 weeks Electronic Signature(s) Signed: 08/14/2019 9:07:11 AM By: Linton Ham MD Entered By:  Linton Ham on 08/11/2019 14:04:16 -------------------------------------------------------------------------------- SuperBill Details Patient Name: Date of Service: [name] 08/11/2019 Medical Record [id number] Patient Account Number: [id number] Date of Birth/Sex: Treating RN: 1961/02/28 (59 y.o. 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, left ankle and foot E11.51 Type 2 diabetes mellitus with diabetic peripheral angiopathy without gangrene L97.521 Non-pressure chronic ulcer of other part of left foot limited to breakdown of skin Facility Procedures CPT4 Code Description: [id number] - DEB  SUBQ TISSUE 20 SQ CM/< ICD-10 Diagnosis Description L97.524 Non-pressure chronic ulcer of other part of left foot with M86.472 Chronic osteomyelitis with draining sinus, left ankle and f Modifier: necrosis of oot Quantity: 1 bone Physician Procedures CPT4 Code Description: [id number] 11042 - WC PHYS SUBQ TISS 20 SQ CM ICD-10 Diagnosis Description L97.524 Non-pressure chronic ulcer of other part of left foot with M86.472 Chronic osteomyelitis with draining sinus, left ankle and f Modifier: necrosis of oot Quantity: 1 bone Electronic Signature(s) Signed: 08/14/2019 9:07:11 AM By: Baltazar Najjar MD Entered By: Baltazar Najjar on 08/11/2019 14:04:33

## 2019-09-01 ENCOUNTER — Encounter (HOSPITAL_BASED_OUTPATIENT_CLINIC_OR_DEPARTMENT_OTHER): Payer: Medicare Other | Admitting: Internal Medicine

## 2019-09-01 DIAGNOSIS — E11621 Type 2 diabetes mellitus with foot ulcer: Secondary | ICD-10-CM | POA: Diagnosis not present

## 2019-09-01 NOTE — Progress Notes (Signed)
Frank, [name] ([id number]) Visit Report for 09/01/2019 HPI Details Patient Name: Date of Service: [name] [place] D. 09/01/2019 1:00 PM Medical Record Number: [id number] Patient Account Number: [id number] Date of Birth/Sex: Treating RN: February 20, 1961 (59 y.o. [name] Primary Care Provider: SYSTEM, PCP Other Clinician: Referring Provider: Treating Provider/Extender: Thea Silversmith in Treatment: 15 History of Present Illness HPI Description: ADMISSION 05/19/2019 [name] is now a 59 year old man with type 2 diabetes. He was actually in this clinic in 2006 in 2008. I do not have these records. More recently I actually looked after him in primary care with [name] up until 2016. Notable for the fact that I treated him for osteomyelitis in the right foot for an MRSA infection in the facility in 2014 or thereabouts. Gave him a prolonged course of vancomycin in the facility. My notes at the time do not list him has having significant PAD. He has severe diabetic neuropathy. The patient apparently has had wounds on his feet since November. This includes the right second toe and the left foot at the mid part of the fifth metatarsal. They are using Santyl on the left foot I am not clear what they are using to the right toe. I do not know if there is been any imaging studies done. It does not appear that his vascular status is been rechecked. 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 The patient is on a combination of Augmentin and Cipro both for 2 weeks.  The patient had his arterial studies done at [facility] yesterday I will need to research these results. We are using silver alginate on the left lateral foot and on the right second toe 2/12; he should be completing his antibiotics Augmentin and Cipro. Unfortunately had his arterial studies done at radiology at [facility] they did not do right ABI or left ABIs or TBI's for that matter even though all of these were ordered. Nevertheless on the right there was triphasic waveforms of the common femoral artery, profunda femoris SFA, popliteal artery. Triphasic posterior tibial and anterior tibial as well as peroneal arteries. On the left there was a definite drop off of the distal SFA and popliteal were monophasic. Comment made that they may need to correlate with an ABI on the left even though this was ordered. We will call up and see what the problem was there. He has wounds on the left lateral fifth metatarsal head and the right second toe. Paradoxically both of the wounds look somewhat better 2/26; patient had his arterial studies yesterday at [facility]. These showed an ABI of 0.97 at the dorsalis pedis on the right with a TBI of 0.54. Biphasic waveforms. On the left ABI at 0.94 at the dorsalis pedis TBI at 0.43. The area on the left lateral foot appears to be better. Right second toe was closed. At discharge he was noted to have wounds on the left medial fifth and the left lateral fourth and the webspace. Both toes were tender and erythematous 3/12. The area on the left lateral foot appears to be contracting. He has areas on the left medial third and the lateral fourth. These  may be friction areas from the toes pressing against each other. X-rays of the left foot did not comment osteomyelitis done at the facility 3/26 2-week follow-up. The patient arrives today with the area that we have been following on the left lateral foot superficial and looking better. However last time he was here he had new open  areas on the left medial third and lateral fourth toes. The lateral fourth toe was done well however the medial third toe has exposed bone. The toe itself is erythemtous and warm 4/30; the patient's left third toe and fourth toe are both closed. The area on the left lateral foot still has some probing depth. Weeping tissue no overt infection does not probe to bone. We have been using silver alginate on all these areas The patient has what looks to be a deep tissue injury on the medial part of the left first metatarsal head there is no open area here however Electronic Signature(s) Signed: 09/01/2019 6:02:27 PM By: Linton Ham MD Entered By: Linton Ham on 09/01/2019 16:33:47 -------------------------------------------------------------------------------- Physical Exam Details Patient Name: Date of Service: [name], Frank [place] EN D. 09/01/2019 1:00 PM Medical Record Number: [id number] Patient Account Number: [id number] Date of Birth/Sex: Treating RN: 09/28/1960 (59 y.o. [name] Primary Care Provider: Other Clinician: SYSTEM, PCP Referring Provider: Treating Provider/Extender: Thea Silversmith in Treatment: 43 Constitutional Patient is hypertensive.. Pulse regular and within target range for patient.Marland Kitchen Respirations regular, non-labored and within target range.. Temperature is normal and within the target range for the patient.Marland Kitchen Appears in no distress. Notes Wound exam; left lateral foot small open area he does not have exposed bone however there is some probing depth of this. Weepy tissue did not look particularly viable. I used silver nitrate on this. The left third toe and left fourth toe are closed over. He has a concerning deep tissue injury on the first metatarsal head medially there is no open area however Electronic Signature(s) Signed: 09/01/2019 6:02:27 PM By: Linton Ham MD Entered By: Linton Ham on 09/01/2019  16:34:55 -------------------------------------------------------------------------------- Physician Orders Details Patient Name: Date of Service: [name], Frank Kennedy EN D. 09/01/2019 1:00 PM Medical Record Number: [id number] Patient Account Number: [id number] Date of Birth/Sex: Treating RN: 01-17-1961 (59 y.o. [name] Primary Care Provider: SYSTEM, PCP Other Clinician: Referring Provider: Treating Provider/Extender: Thea Silversmith in Treatment: 15 Verbal / Phone Orders: No Diagnosis Coding ICD-10 Coding Code Description E11.621 Type 2 diabetes mellitus with foot ulcer L97.524 Non-pressure chronic ulcer of other part of left foot with necrosis of bone M86.472 Chronic osteomyelitis with draining sinus, left ankle and foot E11.51 Type 2 diabetes mellitus with diabetic peripheral angiopathy without gangrene L97.521 Non-pressure chronic ulcer of other part of left foot limited to breakdown of skin Follow-up Appointments ppointment in 2 weeks. - Friday Return A Dressing Change Frequency Wound #3 Left,Lateral Foot Change dressing every day. Wound #6 Left T Third oe Change dressing every day. Skin Barriers/Peri-Wound Care Skin Prep Wound Cleansing Wound #3 Left,Lateral Foot Clean wound with Wound Cleanser Wound #6 Left T Third oe Clean wound with Wound Cleanser Primary Wound Dressing Wound #3 Left,Lateral Foot Calcium Alginate with Silver Wound #6 Left T Third oe Calcium Alginate with Silver Foam - use to separate 3rd and 4th toes. cushion 1st met head with foam as well Secondary Dressing Wound #3 Left,Lateral Foot Kerlix/Rolled Gauze Dry Gauze Wound #6 Left T Third oe Kerlix/Rolled Gauze Dry Gauze Off-Loading Multipodus Splint to: - both feet  Electronic Signature(s) Signed: 09/01/2019 5:51:03 PM By: Kela Millin Signed: 09/01/2019 6:02:27 PM By: Linton Ham MD Entered By: Kela Millin on 09/01/2019  14:09:24 -------------------------------------------------------------------------------- Problem List Details Patient Name: Date of Service: [name], Frank Kennedy EN D. 09/01/2019 1:00 PM Medical Record Number: [id number] Patient Account Number: [id number] Date of Birth/Sex: Treating RN: 07-04-1960 (59 y.o. [name] Primary Care Provider: SYSTEM, PCP Other Clinician: Referring Provider: Treating Provider/Extender: Thea Silversmith in Treatment: 15 Active Problems ICD-10 Encounter Code Description Active Date MDM Diagnosis E11.621 Type 2 diabetes mellitus with foot ulcer 05/19/2019 No Yes L97.524 Non-pressure chronic ulcer of other part of left foot with necrosis of bone 05/19/2019 No Yes M86.472 Chronic osteomyelitis with draining sinus, left ankle and foot 08/11/2019 No Yes E11.51 Type 2 diabetes mellitus with diabetic peripheral angiopathy without gangrene 05/19/2019 No Yes L97.521 Non-pressure chronic ulcer of other part of left foot limited to breakdown of 06/30/2019 No Yes skin Inactive Problems ICD-10 Code Description Active Date Inactive Date L97.514 Non-pressure chronic ulcer of other part of right foot with necrosis of bone 05/19/2019 05/19/2019 Resolved Problems Electronic Signature(s) Signed: 09/01/2019 6:02:27 PM By: Linton Ham MD Entered By: Linton Ham on 09/01/2019 16:31:56 -------------------------------------------------------------------------------- Progress Note Details Patient Name: Date of Service: [name], Frank Kennedy EN D. 09/01/2019 1:00 PM Medical Record Number: [id number] Patient Account Number: [id number] Date of Birth/Sex: Treating RN: Jul 21, 1960 (59 y.o. [name] Primary Care Provider: SYSTEM, PCP Other Clinician: Referring Provider: Treating Provider/Extender: Thea Silversmith in Treatment: 15 Subjective History of Present Illness (HPI) ADMISSION 05/19/2019 Mr. Casaus is now a 59 year old man with  type 2 diabetes. He was actually in this clinic in 2006 in 2008. I do not have these records. More recently I actually looked after him in primary care with [facility] up until 2016. Notable for the fact that I treated him for osteomyelitis in the right foot for an MRSA infection in the facility in 2014 or thereabouts. Gave him a prolonged course of vancomycin in the facility. My notes at the time do not list him has having significant PAD. He has severe diabetic neuropathy. The patient apparently has had wounds on his feet since November. This includes the right second toe and the left foot at the mid part of the fifth metatarsal. They are using Santyl on the left foot I am not clear what they are using to the right toe. I do not know if there is been any imaging studies done. It does not appear that his vascular status is been rechecked. 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 The patient is on a combination of Augmentin and Cipro both for 2 weeks. The patient had his arterial studies done at [facility] yesterday I will need to research these results. We are using silver alginate on the left lateral foot and on the right second toe 2/12; he should be completing his antibiotics Augmentin and Cipro. Unfortunately had his arterial studies done at radiology at [facility] they did not do right ABI or left ABIs or TBI's for that matter even though all of these were ordered. Nevertheless on the right there was triphasic waveforms of  the common femoral artery, profunda femoris SFA, popliteal  artery. Triphasic posterior tibial and anterior tibial as well as peroneal arteries. On the left there was a definite drop off of the distal SFA and popliteal were monophasic. Comment made that they may need to correlate with an ABI on the left even though this was ordered. We will call up and see what the problem was there. He has wounds on the left lateral fifth metatarsal head and the right second toe. Paradoxically both of the wounds look somewhat better 2/26; patient had his arterial studies yesterday at [facility]. These showed an ABI of 0.97 at the dorsalis pedis on the right with a TBI of 0.54. Biphasic waveforms. On the left ABI at 0.94 at the dorsalis pedis TBI at 0.43. The area on the left lateral foot appears to be better. Right second toe was closed. At discharge he was noted to have wounds on the left medial fifth and the left lateral fourth and the webspace. Both toes were tender and erythematous 3/12. The area on the left lateral foot appears to be contracting. He has areas on the left medial third and the lateral fourth. These may be friction areas from the toes pressing against each other. X-rays of the left foot did not comment osteomyelitis done at the facility 3/26 2-week follow-up. The patient arrives today with the area that we have been following on the left lateral foot superficial and looking better. However last time he was here he had new open areas on the left medial third and lateral fourth toes. The lateral fourth toe was done well however the medial third toe has exposed bone. The toe itself is erythemtous and warm 4/30; the patient's left third toe and fourth toe are both closed. The area on the left lateral foot still has some probing depth. Weeping tissue no overt infection does not probe to bone. We have been using silver alginate on all these areas The patient has what looks to be a deep tissue injury on the medial part of the left first metatarsal head there is no  open area here however Objective Constitutional Patient is hypertensive.. Pulse regular and within target range for patient.Marland Kitchen Respirations regular, non-labored and within target range.. Temperature is normal and within the target range for the patient.Marland Kitchen Appears in no distress. Vitals Time Taken: 1:35 PM, Height: 73 in, Weight: 207 lbs, BMI: 27.3, Temperature: 98.5 F, Pulse: 79 bpm, Respiratory Rate: 18 breaths/min, Blood Pressure: 140/60 mmHg. General Notes: Wound exam; left lateral foot small open area he does not have exposed bone however there is some probing depth of this. Weepy tissue did not look particularly viable. I used silver nitrate on this. ooThe left third toe and left fourth toe are closed over. ooHe has a concerning deep tissue injury on the first metatarsal head medially there is no open area however Integumentary (Hair, Skin) Wound #3 status is Open. Original cause of wound was Gradually Appeared. The wound is located on the Left,Lateral Foot. The wound measures 0.5cm length x 0.3cm width x 0.5cm depth; 0.118cm^2 area and 0.059cm^3 volume. There is Fat Layer (Subcutaneous Tissue) Exposed exposed. There is no undermining noted, however, there is tunneling at 3:00 with a maximum distance of 1.2cm. There is a small amount of serosanguineous drainage noted. The wound margin is well defined and not attached to the wound base. There is large (67-100%) red, hyper - granulation within the wound bed. There is no necrotic tissue within the wound  bed. Wound #6 status is Open. Original cause of wound was Gradually Appeared. The wound is located on the Left T Third. The wound measures 0.1cm length x oe 0.1cm width x 0.1cm depth; 0.008cm^2 area and 0.001cm^3 volume. There is no tunneling or undermining noted. There is a none present amount of drainage noted. The wound margin is flat and intact. There is small (1-33%) pink granulation within the wound bed. There is no necrotic tissue within  the wound bed. 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 - Friday Dressing Change Frequency: Wound #3 Left,Lateral Foot: Change dressing every day. Wound #6 Left T Third: oe Change dressing every day. Skin Barriers/Peri-Wound Care: Skin Prep Wound Cleansing: Wound #3 Left,Lateral Foot: Clean wound with Wound Cleanser Wound #6 Left T Third: oe Clean wound with Wound Cleanser Primary Wound Dressing: Wound #3 Left,Lateral Foot: Calcium Alginate with Silver Wound #6 Left T Third: oe Calcium Alginate with Silver Foam - use to separate 3rd and 4th toes. cushion 1st met head with foam as well Secondary Dressing: Wound #3 Left,Lateral Foot: Kerlix/Rolled Gauze Dry Gauze Wound #6 Left T Third: oe Kerlix/Rolled Gauze Dry Gauze Off-Loading: Multipodus Splint to: - both feet 1. I am unsure where he is with the vancomycin he might be finished that the original order was for 4 weeks 2. The left third toe is closed as is the fourth toe 3 deep tissue injury will need to be offloaded. 4. Still silver alginate to the left lateral foot Electronic Signature(s) Signed: 09/01/2019 6:02:27 PM By: Linton Ham MD Entered By: Linton Ham on 09/01/2019 16:37:26 -------------------------------------------------------------------------------- SuperBill Details Patient Name: Date of Service: [name], Frank Kennedy EN D. 09/01/2019 Medical Record Number: [id number] Patient Account Number: [id number] Date of Birth/Sex: Treating RN: July 06, 1960 (59 y.o. [name] Primary Care Provider: SYSTEM,  PCP Other Clinician: Referring Provider: Treating Provider/Extender: Thea Silversmith in Treatment: 15 Diagnosis Coding ICD-10 Codes Code Description E11.621 Type 2 diabetes mellitus with foot ulcer L97.524 Non-pressure chronic ulcer of other part of left foot with necrosis of bone M86.472 Chronic osteomyelitis with draining sinus, left ankle and foot E11.51 Type 2 diabetes mellitus with diabetic peripheral angiopathy without gangrene L97.521 Non-pressure chronic ulcer of other part of left foot limited to breakdown of skin Facility Procedures CPT4 Code: [id number] Description: 99214 - WOUND CARE VISIT-LEV 4 EST PT Modifier: Quantity: 1 Physician Procedures : CPT4 Code Description Modifier [id number] - WC PHYS LEVEL 3 - EST PT ICD-10 Diagnosis Description E11.621 Type 2 diabetes mellitus with foot ulcer L97.524 Non-pressure chronic ulcer of other part of left foot with necrosis of bone L97.521  Non-pressure chronic ulcer of other part of left foot limited to breakdown of skin Quantity: 1 Electronic Signature(s) Signed: 09/01/2019 6:02:27 PM By: Linton Ham MD Entered By: Linton Ham on 09/01/2019 16:37:46

## 2019-09-13 NOTE — Progress Notes (Signed)
[name], [name] ([id number]) Visit Report for 09/01/2019 Arrival Information Details Patient Name: Date of Service: Frank Ours [place] D. 09/01/2019 1:00 PM Medical Record Number: [id number] Patient Account Number: [id number] Date of Birth/Sex: Treating RN: [birth year] (59 y.o. [name] Primary Care Mavis Fichera: SYSTEM, PCP Other Clinician: Referring Rosealie Reach: Treating Novelle Addair/Extender: Thea Silversmith in Treatment: 15 Visit Information History Since Last Visit Added or deleted any medications: No Patient Arrived: Wheel Chair Any new allergies or adverse reactions: No Arrival Time: 13:33 Had a fall or experienced change in No Accompanied By: caregiver activities of daily living that may affect Transfer Assistance: None risk of falls: Patient Identification Verified: Yes Signs or symptoms of abuse/neglect since last visito No Secondary Verification Process Completed: Yes Hospitalized since last visit: No Patient Requires Transmission-Based Precautions: No Implantable device outside of the clinic excluding No Patient Has Alerts: Yes cellular tissue based products placed in the center Patient Alerts: R and L ABIs N/C since last visit: Has Dressing in Place as Prescribed: Yes Pain Present Now: No Electronic Signature(s) Signed: 09/13/2019 9:09:16 AM By: Sandre Kitty Entered By: Sandre Kitty on 09/01/2019 13:35:47 -------------------------------------------------------------------------------- Clinic Level of Care Assessment Details Patient Name: Date of Service: Frank Ours [place] D. 09/01/2019 1:00 PM Medical Record Number: [id number] Patient Account Number: [id number] Date of Birth/Sex: Treating RN: 1960-07-12 (59 y.o. [name] Primary Care Homar Weinkauf: SYSTEM, PCP Other Clinician: Referring Che Rachal: Treating Tramya Schoenfelder/Extender: Thea Silversmith in Treatment: 15 Clinic Level of Care Assessment Items TOOL 4 Quantity  Score X- 1 0 Use when only an EandM is performed on FOLLOW-UP visit ASSESSMENTS - Nursing Assessment / Reassessment X- 1 10 Reassessment of Co-morbidities (includes updates in patient status) X- 1 5 Reassessment of Adherence to Treatment Plan ASSESSMENTS - Wound and Skin A ssessment / Reassessment []  - 0 Simple Wound Assessment / Reassessment - one wound X- 2 5 Complex Wound Assessment / Reassessment - multiple wounds []  - 0 Dermatologic / Skin Assessment (not related to wound area) ASSESSMENTS - Focused Assessment X- 1 5 Circumferential Edema Measurements - multi extremities []  - 0 Nutritional Assessment / Counseling / Intervention []  - 0 Lower Extremity Assessment (monofilament, tuning fork, pulses) []  - 0 Peripheral Arterial Disease Assessment (using hand held doppler) ASSESSMENTS - Ostomy and/or Continence Assessment and Care []  - 0 Incontinence Assessment and Management []  - 0 Ostomy Care Assessment and Management (repouching, etc.) PROCESS - Coordination of Care X - Simple Patient / Family Education for ongoing care 1 15 []  - 0 Complex (extensive) Patient / Family Education for ongoing care X- 1 10 Staff obtains Programmer, systems, Records, T Results / Process Orders est X- 1 10 Staff telephones HHA, Nursing Homes / Clarify orders / etc []  - 0 Routine Transfer to another Facility (non-emergent condition) []  - 0 Routine Hospital Admission (non-emergent condition) []  - 0 New Admissions / Biomedical engineer / Ordering NPWT Apligraf, etc. , []  - 0 Emergency Hospital Admission (emergent condition) X- 1 10 Simple Discharge Coordination []  - 0 Complex (extensive) Discharge Coordination PROCESS - Special Needs []  - 0 Pediatric / Minor Patient Management []  - 0 Isolation Patient Management []  - 0 Hearing / Language / Visual special needs []  - 0 Assessment of Community assistance (transportation, D/C planning, etc.) []  - 0 Additional assistance / Altered  mentation []  - 0 Support Surface(s) Assessment (bed, cushion, seat, etc.) INTERVENTIONS - Wound Cleansing / Measurement []  - 0 Simple Wound Cleansing - one wound X- 2 5  Complex Wound Cleansing - multiple wounds X- 1 5 Wound Imaging (photographs - any number of wounds) []  - 0 Wound Tracing (instead of photographs) []  - 0 Simple Wound Measurement - one wound X- 2 5 Complex Wound Measurement - multiple wounds INTERVENTIONS - Wound Dressings X - Small Wound Dressing one or multiple wounds 2 10 []  - 0 Medium Wound Dressing one or multiple wounds []  - 0 Large Wound Dressing one or multiple wounds X- 1 5 Application of Medications - topical []  - 0 Application of Medications - injection INTERVENTIONS - Miscellaneous []  - 0 External ear exam []  - 0 Specimen Collection (cultures, biopsies, blood, body fluids, etc.) []  - 0 Specimen(s) / Culture(s) sent or taken to Lab for analysis []  - 0 Patient Transfer (multiple staff / Civil Service fast streamer / Similar devices) []  - 0 Simple Staple / Suture removal (25 or less) []  - 0 Complex Staple / Suture removal (26 or more) []  - 0 Hypo / Hyperglycemic Management (close monitor of Blood Glucose) []  - 0 Ankle / Brachial Index (ABI) - do not check if billed separately X- 1 5 Vital Signs Has the patient been seen at the hospital within the last three years: Yes Total Score: 130 Level Of Care: New/Established - Level 4 Electronic Signature(s) Signed: 09/01/2019 5:51:03 PM By: Kela Millin Entered By: Kela Millin on 09/01/2019 14:09:59 -------------------------------------------------------------------------------- Encounter Discharge Information Details Patient Name: Date of Service: [name], Frank Kennedy EN D. 09/01/2019 1:00 PM Medical Record Number: [id number] Patient Account Number: [id number] Date of Birth/Sex: Treating RN: 1961-04-21 (59 y.o. [name] Primary Care Leiby Pigeon: SYSTEM, PCP Other Clinician: Referring  Teressa Mcglocklin: Treating Lakynn Halvorsen/Extender: Thea Silversmith in Treatment: 15 Encounter Discharge Information Items Discharge Condition: Stable Ambulatory Status: Wheelchair Discharge Destination: Skilled Nursing Facility Telephoned: No Orders Sent: Yes Transportation: Private Auto Accompanied By: caregiver Schedule Follow-up Appointment: Yes Clinical Summary of Care: Electronic Signature(s) Signed: 09/01/2019 5:38:02 PM By: Deon Pilling Entered By: Deon Pilling on 09/01/2019 14:27:26 -------------------------------------------------------------------------------- Lower Extremity Assessment Details Patient Name: Date of Service: Frank Ours EN D. 09/01/2019 1:00 PM Medical Record Number: [id number] Patient Account Number: [id number] Date of Birth/Sex: Treating RN: 12/01/1960 (59 y.o. [name] Primary Care Leshon Armistead: SYSTEM, PCP Other Clinician: Referring Gevon Markus: Treating Anzal Bartnick/Extender: Thea Silversmith in Treatment: 15 Edema Assessment Assessed: Shirlyn Goltz: No] Patrice Paradise: No] Edema: [Left: N] [Right: o] Calf Left: Right: Point of Measurement: cm From Medial Instep 28 cm cm Ankle Left: Right: Point of Measurement: cm From Medial Instep 18.7 cm cm Vascular Assessment Pulses: Dorsalis Pedis Palpable: [Left:Yes] Electronic Signature(s) Signed: 09/01/2019 5:50:44 PM By: Baruch Gouty RN, BSN Entered By: Baruch Gouty on 09/01/2019 13:42:43 -------------------------------------------------------------------------------- Multi-Disciplinary Care Plan Details Patient Name: Date of Service: [name], Frank Kennedy EN D. 09/01/2019 1:00 PM Medical Record Number: [id number] Patient Account Number: [id number] Date of Birth/Sex: Treating RN: [birth year] (59 y.o. [name] Primary Care Jasa Dundon: SYSTEM, PCP Other Clinician: Referring Kala Gassmann: Treating Ishani Goldwasser/Extender: Thea Silversmith in Treatment: 15 Active  Inactive Nutrition Nursing Diagnoses: Imbalanced nutrition Goals: Patient/caregiver will maintain therapeutic glucose control Date Initiated: 05/19/2019 Target Resolution Date: 09/29/2019 Goal Status: Active Interventions: Provide education on elevated blood sugars and impact on wound healing Notes: Wound/Skin Impairment Nursing Diagnoses: Impaired tissue integrity Goals: Ulcer/skin breakdown will have a volume reduction of 50% by week 8 Date Initiated: 05/19/2019 Target Resolution Date: 09/29/2019 Goal Status: Active Interventions: Provide education on ulcer and skin care Notes: Electronic Signature(s) Signed: 09/01/2019 5:51:03 PM By:  Dwiggins, Larene Beach Entered By: Kela Millin on 09/01/2019 13:13:56 -------------------------------------------------------------------------------- Pain Assessment Details Patient Name: Date of Service: Frank Ours [place] D. 09/01/2019 1:00 PM Medical Record Number: [id number] Patient Account Number: [id number] Date of Birth/Sex: Treating RN: 1960/07/02 (59 y.o. [name] Primary Care Tieshia Rettinger: SYSTEM, PCP Other Clinician: Referring Evelio Rueda: Treating Inman Fettig/Extender: Thea Silversmith in Treatment: 15 Active Problems Location of Pain Severity and Description of Pain Patient Has Paino No Site Locations Pain Management and Medication Current Pain Management: Electronic Signature(s) Signed: 09/01/2019 5:51:03 PM By: Kela Millin Signed: 09/13/2019 9:09:16 AM By: Sandre Kitty Entered By: Sandre Kitty on 09/01/2019 13:36:14 -------------------------------------------------------------------------------- Patient/Caregiver Education Details Patient Name: Date of Service: Frank Ours EN D. 4/30/2021andnbsp1:00 PM Medical Record Number: [id number] Patient Account Number: [id number] Date of Birth/Gender: Treating RN: February 06, 1961 (59 y.o. [name] Primary Care Physician: SYSTEM, PCP Other  Clinician: Referring Physician: Treating Physician/Extender: Thea Silversmith in Treatment: 15 Education Assessment Education Provided To: Patient Education Topics Provided Elevated Blood Sugar/ Impact on Healing: Handouts: Elevated Blood Sugars: How Do They Affect Wound Healing Methods: Explain/Verbal Responses: State content correctly Wound/Skin Impairment: Handouts: Caring for Your Ulcer Methods: Explain/Verbal Responses: State content correctly Electronic Signature(s) Signed: 09/01/2019 5:51:03 PM By: Kela Millin Entered By: Kela Millin on 09/01/2019 13:14:32 -------------------------------------------------------------------------------- Wound Assessment Details Patient Name: Date of Service: [name], Frank Kennedy EN D. 09/01/2019 1:00 PM Medical Record Number: [id number] Patient Account Number: [id number] Date of Birth/Sex: Treating RN: 03/01/1961 (59 y.o. [name] Primary Care Needham Biggins: SYSTEM, PCP Other Clinician: Referring Joell Usman: Treating Thierry Dobosz/Extender: Thea Silversmith in Treatment: 15 Wound Status Wound Number: 3 Primary Diabetic Wound/Ulcer of the Lower Extremity Etiology: Wound Location: Left, Lateral Foot Wound Open Wounding Event: Gradually Appeared Status: Date Acquired: 03/05/2019 Comorbid Glaucoma, Coronary Artery Disease, Deep Vein Thrombosis, Weeks Of Treatment: 15 History: Hypertension, Peripheral Arterial Disease, Type II Diabetes, Clustered Wound: No Dementia, Neuropathy, Seizure Disorder Photos Photo Uploaded By: Mikeal Hawthorne on 09/04/2019 10:52:56 Wound Measurements Length: (cm) 0.5 Width: (cm) 0.3 Depth: (cm) 0.5 Area: (cm) 0.118 Volume: (cm) 0.059 % Reduction in Area: 96% % Reduction in Volume: 98.9% Epithelialization: Medium (34-66%) Tunneling: Yes Position (o'clock): 3 Maximum Distance: (cm) 1.2 Undermining: No Wound Description Classification: Grade 2 Wound Margin:  Well defined, not attached Exudate Amount: Small Exudate Type: Serosanguineous Exudate Color: red, brown Foul Odor After Cleansing: No Slough/Fibrino No Wound Bed Granulation Amount: Large (67-100%) Exposed Structure Granulation Quality: Red, Hyper-granulation Fascia Exposed: No Necrotic Amount: None Present (0%) Fat Layer (Subcutaneous Tissue) Exposed: Yes Tendon Exposed: No Muscle Exposed: No Joint Exposed: No Bone Exposed: No Treatment Notes Wound #3 (Left, Lateral Foot) 1. Cleanse With Wound Cleanser 3. Primary Dressing Applied Calcium Alginate Ag 4. Secondary Dressing Roll Gauze Foam 5. Secured With Medipore tape Notes foam between 3rd and 4th toe. foam padding to first met head left foot. Electronic Signature(s) Signed: 09/01/2019 5:50:44 PM By: Baruch Gouty RN, BSN Entered By: Baruch Gouty on 09/01/2019 13:47:39 -------------------------------------------------------------------------------- Wound Assessment Details Patient Name: Date of Service: [name], Frank Kennedy EN D. 09/01/2019 1:00 PM Medical Record Number: [id number] Patient Account Number: [id number] Date of Birth/Sex: Treating RN: [birth year] (59 y.o. [name] Primary Care Denzal Meir: SYSTEM, PCP Other Clinician: Referring Wreatha Sturgeon: Treating Chanah Tidmore/Extender: Thea Silversmith in Treatment: 15 Wound Status Wound Number: 6 Primary Diabetic Wound/Ulcer of the Lower Extremity Etiology: Wound Location: Left T Third oe Wound Open Wounding Event: Gradually Appeared Status: Date Acquired: 06/30/2019 Comorbid  Glaucoma, Coronary Artery Disease, Deep Vein Thrombosis, Weeks Of Treatment: 9 History: Hypertension, Peripheral Arterial Disease, Type II Diabetes, Clustered Wound: No Dementia, Neuropathy, Seizure Disorder Photos Photo Uploaded By: Mikeal Hawthorne on 09/04/2019 10:52:31 Wound Measurements Length: (cm) 0.1 Width: (cm) 0.1 Depth: (cm) 0.1 Area: (cm) 0.008 Volume:  (cm) 0.001 % Reduction in Area: 94.9% % Reduction in Volume: 93.8% Epithelialization: Large (67-100%) Tunneling: No Undermining: No Wound Description Classification: Grade 2 Wound Margin: Flat and Intact Exudate Amount: None Present Foul Odor After Cleansing: No Slough/Fibrino No Wound Bed Granulation Amount: Small (1-33%) Exposed Structure Granulation Quality: Pink Fascia Exposed: No Necrotic Amount: None Present (0%) Fat Layer (Subcutaneous Tissue) Exposed: No Tendon Exposed: No Muscle Exposed: No Joint Exposed: No Bone Exposed: No Treatment Notes Wound #6 (Left Toe Third) 1. Cleanse With Wound Cleanser 3. Primary Dressing Applied Calcium Alginate Ag 4. Secondary Dressing Roll Gauze Foam 5. Secured With Medipore tape Notes foam between 3rd and 4th toe. foam padding to first met head left foot. Electronic Signature(s) Signed: 09/01/2019 5:51:03 PM By: Kela Millin Entered By: Kela Millin on 09/01/2019 14:08:26 -------------------------------------------------------------------------------- Vitals Details Patient Name: Date of Service: [name], Frank Kennedy EN D. 09/01/2019 1:00 PM Medical Record Number: [id number] Patient Account Number: [id number] Date of Birth/Sex: Treating RN: 1960/06/02 (59 y.o. [name] Primary Care Lynnlee Revels: SYSTEM, PCP Other Clinician: Referring Shaleigh Laubscher: Treating Jontavia Leatherbury/Extender: Thea Silversmith in Treatment: 15 Vital Signs Time Taken: 13:35 Temperature (F): 98.5 Height (in): 73 Pulse (bpm): 79 Weight (lbs): 207 Respiratory Rate (breaths/min): 18 Body Mass Index (BMI): 27.3 Blood Pressure (mmHg): 140/60 Reference Range: 80 - 120 mg / dl Electronic Signature(s) Signed: 09/13/2019 9:09:16 AM By: Sandre Kitty Entered By: Sandre Kitty on 09/01/2019 13:36:04

## 2019-09-15 ENCOUNTER — Encounter (HOSPITAL_BASED_OUTPATIENT_CLINIC_OR_DEPARTMENT_OTHER): Payer: Medicare Other | Admitting: Internal Medicine

## 2019-09-22 ENCOUNTER — Encounter (HOSPITAL_BASED_OUTPATIENT_CLINIC_OR_DEPARTMENT_OTHER): Payer: Medicare Other | Attending: Internal Medicine | Admitting: Internal Medicine

## 2019-09-22 DIAGNOSIS — E11621 Type 2 diabetes mellitus with foot ulcer: Secondary | ICD-10-CM | POA: Diagnosis present

## 2019-09-22 DIAGNOSIS — G40909 Epilepsy, unspecified, not intractable, without status epilepticus: Secondary | ICD-10-CM | POA: Diagnosis not present

## 2019-09-22 DIAGNOSIS — E1151 Type 2 diabetes mellitus with diabetic peripheral angiopathy without gangrene: Secondary | ICD-10-CM | POA: Insufficient documentation

## 2019-09-22 DIAGNOSIS — L97524 Non-pressure chronic ulcer of other part of left foot with necrosis of bone: Secondary | ICD-10-CM | POA: Insufficient documentation

## 2019-09-22 DIAGNOSIS — E1140 Type 2 diabetes mellitus with diabetic neuropathy, unspecified: Secondary | ICD-10-CM | POA: Diagnosis not present

## 2019-09-22 DIAGNOSIS — I10 Essential (primary) hypertension: Secondary | ICD-10-CM | POA: Insufficient documentation

## 2019-09-22 NOTE — Progress Notes (Signed)
Frank, [name] ([id number]) Visit Report for 09/22/2019 HPI Details Patient Name: Date of Service: [name] [place] D. 09/22/2019 2:15 PM Medical Record Number: [id number] Patient Account Number: [id number] Date of Birth/Sex: Treating RN: 1960/07/06 (59 y.o. [name] Primary Care Provider: SYSTEM, PCP Other Clinician: Referring Provider: Treating Provider/Extender: [name] in Treatment: 18 History of Present Illness HPI Description: ADMISSION 05/19/2019 [name] is now a 59 year old man with type 2 diabetes. He was actually in this clinic in 2006 in 2008. I do not have these records. More recently I actually looked after him in primary care with [facility] up until 2016. Notable for the fact that I treated him for osteomyelitis in the right foot for an MRSA infection in the facility in 2014 or thereabouts. Gave him a prolonged course of vancomycin in the facility. My notes at the time do not list him has having significant PAD. He has severe diabetic neuropathy. The patient apparently has had wounds on his feet since November. This includes the right second toe and the left foot at the mid part of the fifth metatarsal. They are using Santyl on the left foot I am not clear what they are using to the right toe. I do not know if there is been any imaging studies done. It does not appear that his vascular status is been rechecked. 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 The patient is on a combination of Augmentin and Cipro both for 2 weeks.  The patient had his arterial studies done at [facility] yesterday I will need to research these results. We are using silver alginate on the left lateral foot and on the right second toe 2/12; he should be completing his antibiotics Augmentin and Cipro. Unfortunately had his arterial studies done at radiology at [facility] they did not do right ABI or left ABIs or TBI's for that matter even though all of these were ordered. Nevertheless on the right there was triphasic waveforms of the common femoral artery, profunda femoris SFA, popliteal artery. Triphasic posterior tibial and anterior tibial as well as peroneal arteries. On the left there was a definite drop off of the distal SFA and popliteal were monophasic. Comment made that they may need to correlate with an ABI on the left even though this was ordered. We will call up and see what the problem was there. He has wounds on the left lateral fifth metatarsal head and the right second toe. Paradoxically both of the wounds look somewhat better 2/26; patient had his arterial studies yesterday at [facility]. These showed an ABI of 0.97 at the dorsalis pedis on the right with a TBI of 0.54. Biphasic waveforms. On the left ABI at 0.94 at the dorsalis pedis TBI at 0.43. The area on the left lateral foot appears to be better. Right second toe was closed. At discharge he was noted to have wounds on the left medial fifth and the left lateral fourth and the webspace. Both toes were tender and erythematous 3/12. The area on the left lateral foot appears to be contracting. He has areas on the left medial third and the lateral fourth. These  may be friction areas from the toes pressing against each other. X-rays of the left foot did not comment osteomyelitis done at the facility 3/26 2-week follow-up. The patient arrives today with the area that we have been following on the left lateral foot superficial and looking better. However last time he was here he had new open  areas on the left medial third and lateral fourth toes. The lateral fourth toe was done well however the medial third toe has exposed bone. The toe itself is erythemtous and warm 4/30; the patient's left third toe and fourth toe are both closed. The area on the left lateral foot still has some probing depth. Weeping tissue no overt infection does not probe to bone. We have been using silver alginate on all these areas The patient has what looks to be a deep tissue injury on the medial part of the left first metatarsal head there is no open area here however 5/21; the patient's problematic area on the left lateral foot is still open but with some improvement. He has new open areas on the lateral part of the left second toe and the DTI injury over the metatarsal head is open today with a very superficial wound Electronic Signature(s) Signed: 09/22/2019 6:00:23 PM By: Linton Ham MD Entered By: Linton Ham on 09/22/2019 15:33:42 -------------------------------------------------------------------------------- Physical Exam Details Patient Name: Date of Service: [name], [name] EN D. 09/22/2019 2:15 PM Medical Record Number: [id number] Patient Account Number: [id number] Date of Birth/Sex: Treating RN: May 15, 1960 (59 y.o. [name] Primary Care Provider: SYSTEM, PCP Other Clinician: Referring Provider: Treating Provider/Extender: [name] in Treatment: 18 Cardiovascular Pedal pulses palpable on the left. Integumentary (Hair, Skin) No erythema around any wound area. Notes Wound exam; left lateral foot the open area with depth is distally very small open area the rest of this looks as though it is closing. Left second toe laterally has an open area The DTI injury on the medial first metatarsal head is open also of superficial wound Electronic Signature(s) Signed: 09/22/2019 6:00:23 PM By: Linton Ham MD Entered By: Linton Ham on 09/22/2019  15:34:40 -------------------------------------------------------------------------------- Physician Orders Details Patient Name: Date of Service: [name], [name] EN D. 09/22/2019 2:15 PM Medical Record Number: [id number] Patient Account Number: [id number] Date of Birth/Sex: Treating RN: 1960-06-09 (59 y.o. [name] Primary Care Provider: SYSTEM, PCP Other Clinician: Referring Provider: Treating Provider/Extender: [name] in Treatment: 18 Verbal / Phone Orders: No Diagnosis Coding ICD-10 Coding Code Description E11.621 Type 2 diabetes mellitus with foot ulcer L97.524 Non-pressure chronic ulcer of other part of left foot with necrosis of bone M86.472 Chronic osteomyelitis with draining sinus, left ankle and foot E11.51 Type 2 diabetes mellitus with diabetic peripheral angiopathy without gangrene L97.521 Non-pressure chronic ulcer of other part of left foot limited to breakdown of skin Follow-up Appointments ppointment in 2 weeks. - Friday Return A Dressing Change Frequency Wound #3 Left,Lateral Foot Change dressing every day. Skin Barriers/Peri-Wound Care Skin Prep Wound Cleansing Wound #3 Left,Lateral Foot Clean wound with Wound Cleanser Primary Wound Dressing Wound #3 Left,Lateral Foot Calcium A lginate with Silver Other: - use foam between all toes to cushion and cushion 1st met head as well Wound #7 Left T Second oe Calcium A lginate with Silver Other: - use foam between all toes to cushion and cushion 1st met head as well Secondary Dressing Wound #3 Left,Lateral Foot Kerlix/Rolled Gauze Dry Gauze Off-Loading Multipodus Splint to: - both feet Electronic  Signature(s) Signed: 09/22/2019 5:37:38 PM By: Kela Millin Signed: 09/22/2019 6:00:23 PM By: Linton Ham MD Entered By: Kela Millin on 09/22/2019 15:36:16 -------------------------------------------------------------------------------- Problem List  Details Patient Name: Date of Service: [name], [name] EN D. 09/22/2019 2:15 PM Medical Record Number: [id number] Patient Account Number: [id number] Date of Birth/Sex: Treating RN: 22-Aug-1960 (59 y.o. [name] Primary Care Provider: SYSTEM, PCP Other Clinician: Referring Provider: Treating Provider/Extender: [name] in Treatment: 18 Active Problems ICD-10 Encounter Code Description Active Date MDM Diagnosis E11.621 Type 2 diabetes mellitus with foot ulcer 05/19/2019 No Yes L97.524 Non-pressure chronic ulcer of other part of left foot with necrosis of bone 05/19/2019 No Yes M86.472 Chronic osteomyelitis with draining sinus, left ankle and foot 08/11/2019 No Yes E11.51 Type 2 diabetes mellitus with diabetic peripheral angiopathy without gangrene 05/19/2019 No Yes L97.521 Non-pressure chronic ulcer of other part of left foot limited to breakdown of 06/30/2019 No Yes skin Inactive Problems ICD-10 Code Description Active Date Inactive Date L97.514 Non-pressure chronic ulcer of other part of right foot with necrosis of bone 05/19/2019 05/19/2019 Resolved Problems Electronic Signature(s) Signed: 09/22/2019 6:00:23 PM By: Linton Ham MD Entered By: Linton Ham on 09/22/2019 15:32:46 -------------------------------------------------------------------------------- Progress Note Details Patient Name: Date of Service: [name], [name] EN D. 09/22/2019 2:15 PM Medical Record Number: [id number] Patient Account Number: [id number] Date of Birth/Sex: Treating RN: 1960/07/14 (59 y.o. [name] Primary Care Provider: SYSTEM, PCP Other Clinician: Referring Provider: Treating Provider/Extender: [name] in Treatment: 18 Subjective History of Present Illness (HPI) ADMISSION 05/19/2019 Mr. Mier is now a 59 year old man with type 2 diabetes. He was actually in this clinic in 2006 in 2008. I do not have these records. More  recently I actually looked after him in primary care with [facility] up until 2016. Notable for the fact that I treated him for osteomyelitis in the right foot for an MRSA infection in the facility in 2014 or thereabouts. Gave him a prolonged course of vancomycin in the facility. My notes at the time do not list him has having significant PAD. He has severe diabetic neuropathy. The patient apparently has had wounds on his feet since November. This includes the right second toe and the left foot at the mid part of the fifth metatarsal. They are using Santyl on the left foot I am not clear what they are using to the right toe. I do not know if there is been any imaging studies done. It does not appear that his vascular status is been rechecked. 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 The patient is on a combination of Augmentin and Cipro both for 2 weeks. The patient had his arterial studies done at [facility] yesterday I will need to research these results. We are using silver alginate on the left lateral foot and on the right second toe 2/12; he should be completing his antibiotics Augmentin and Cipro. Unfortunately had his arterial studies done at radiology at [facility] they did not do right ABI or left ABIs or TBI's for that matter even though all of these were ordered. Nevertheless on the right there was triphasic waveforms of the  common femoral artery, profunda femoris SFA, popliteal artery. Triphasic posterior tibial and anterior tibial as well as peroneal arteries. On the left there  was a definite drop off of the distal SFA and popliteal were monophasic. Comment made that they may need to correlate with an ABI on the left even though this was ordered. We will call up and see what the problem was there. He has wounds on the left lateral fifth metatarsal head and the right second toe. Paradoxically both of the wounds look somewhat better 2/26; patient had his arterial studies yesterday at [facility]. These showed an ABI of 0.97 at the dorsalis pedis on the right with a TBI of 0.54. Biphasic waveforms. On the left ABI at 0.94 at the dorsalis pedis TBI at 0.43. The area on the left lateral foot appears to be better. Right second toe was closed. At discharge he was noted to have wounds on the left medial fifth and the left lateral fourth and the webspace. Both toes were tender and erythematous 3/12. The area on the left lateral foot appears to be contracting. He has areas on the left medial third and the lateral fourth. These may be friction areas from the toes pressing against each other. X-rays of the left foot did not comment osteomyelitis done at the facility 3/26 2-week follow-up. The patient arrives today with the area that we have been following on the left lateral foot superficial and looking better. However last time he was here he had new open areas on the left medial third and lateral fourth toes. The lateral fourth toe was done well however the medial third toe has exposed bone. The toe itself is erythemtous and warm 4/30; the patient's left third toe and fourth toe are both closed. The area on the left lateral foot still has some probing depth. Weeping tissue no overt infection does not probe to bone. We have been using silver alginate on all these areas The patient has what looks to be a deep tissue injury on the medial part of the left first metatarsal head there is no open area here however 5/21; the patient's problematic area on the left lateral foot is still open but  with some improvement. He has new open areas on the lateral part of the left second toe and the DTI injury over the metatarsal head is open today with a very superficial wound Objective Constitutional Vitals Time Taken: 2:22 PM, Height: 73 in, Weight: 207 lbs, BMI: 27.3, Temperature: 98.8 F, Pulse: 81 bpm, Respiratory Rate: 18 breaths/min, Blood Pressure: 127/57 mmHg. Cardiovascular Pedal pulses palpable on the left. General Notes: Wound exam; left lateral foot the open area with depth is distally very small open area the rest of this looks as though it is closing. ooLeft second toe laterally has an open area ooThe DTI injury on the medial first metatarsal head is open also of superficial wound Integumentary (Hair, Skin) No erythema around any wound area. Wound #3 status is Open. Original cause of wound was Gradually Appeared. The wound is located on the Left,Lateral Foot. The wound measures 0.4cm length x 0.3cm width x 0.4cm depth; 0.094cm^2 area and 0.038cm^3 volume. There is Fat Layer (Subcutaneous Tissue) Exposed exposed. There is no undermining noted, however, there is tunneling at 9:00 with a maximum distance of 1.2cm. There is a small amount of serosanguineous drainage noted. The wound margin is well defined and not attached to the wound base. There is large (67-100%) red granulation within the wound bed.  There is no necrotic tissue within the wound bed. Wound #6 status is Open. Original cause of wound was Gradually Appeared. The wound is located on the Left T Third. The wound measures 0cm length x oe 0cm width x 0cm depth; 0cm^2 area and 0cm^3 volume. There is no tunneling or undermining noted. There is a none present amount of drainage noted. The wound margin is flat and intact. There is no granulation within the wound bed. There is no necrotic tissue within the wound bed. Wound #7 status is Open. Original cause of wound was Gradually Appeared. The wound is located on the Left T  Second. The wound measures 0.7cm length x oe 0.3cm width x 0.1cm depth; 0.165cm^2 area and 0.016cm^3 volume. There is Fat Layer (Subcutaneous Tissue) Exposed exposed. There is no tunneling or undermining noted. There is a none present amount of drainage noted. The wound margin is flat and intact. There is no granulation within the wound bed. There is a large (67-100%) amount of necrotic tissue within the wound bed including Eschar. 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 - Friday Dressing Change Frequency: Wound #3 Left,Lateral Foot: Change dressing every day. Wound #6 Left T Third: oe Change dressing every day. Skin Barriers/Peri-Wound Care: Skin Prep Wound Cleansing: Wound #3 Left,Lateral Foot: Clean wound with Wound Cleanser Wound #6 Left T Third: oe Clean wound with Wound Cleanser Primary Wound Dressing: Wound #3 Left,Lateral Foot: Calcium Alginate with Silver Other: - use foam between all toes to cushion and cushion 1st met head as well Wound #6 Left T Third: oe Calcium Alginate with Silver Foam - use foam between all toes to cushion and cushion 1st met head as well Wound #7 Left T Second: oe Calcium Alginate with Silver Other: - use foam between all toes to cushion and cushion 1st met head as well Secondary Dressing: Wound #3 Left,Lateral Foot: Kerlix/Rolled Gauze Dry Gauze Wound #6 Left T Third: oe Kerlix/Rolled Gauze Dry Gauze Other: - use foam between all toes to cushion and cushion 1st met head as well Off-Loading: Multipodus Splint to: - both feet 1. Silver alginate  to all wound areas 2. I am not sure how they are offloading these toes try to keep the separated 3. There is no evidence that the area on the left lateral foot is currently infected although he has a small remnant of the wound that still has some depth. 4. He does not have a vascular issue Electronic Signature(s) Signed: 09/22/2019 6:00:23 PM By: Linton Ham MD Entered By: Linton Ham on 09/22/2019 15:36:41 -------------------------------------------------------------------------------- SuperBill Details Patient Name: Date of Service: [name], [name] EN D. 09/22/2019 Medical Record Number: [id number] Patient Account Number: [id number] Date of Birth/Sex: Treating RN: October 08, 1960 (59 y.o. [name] Primary Care Provider: SYSTEM, PCP Other Clinician: Referring Provider: Treating Provider/Extender: [name] in Treatment: 18 Diagnosis Coding ICD-10 Codes Code Description E11.621 Type 2 diabetes mellitus with foot ulcer L97.524 Non-pressure chronic ulcer of other part of left foot with necrosis of bone M86.472 Chronic osteomyelitis with draining sinus, left ankle and foot E11.51 Type 2 diabetes mellitus with diabetic peripheral angiopathy without gangrene L97.521 Non-pressure chronic  ulcer of other part of left foot limited to breakdown of skin Facility Procedures CPT4 Code: [id number] Description: [place] VISIT-LEV 4 EST PT Modifier: Quantity: 1 Physician Procedures : CPT4 Code Description Modifier [id number] - WC PHYS LEVEL 3 - EST PT ICD-10 Diagnosis Description E11.621 Type 2 diabetes mellitus with foot ulcer L97.524 Non-pressure chronic ulcer of other part of left foot with necrosis of bone L97.521  Non-pressure chronic ulcer of other part of left foot limited to breakdown of skin M86.472 Chronic osteomyelitis with draining sinus, left ankle and foot Quantity: 1 Electronic Signature(s) Signed: 09/22/2019 6:00:23 PM By: Linton Ham MD Entered By: Linton Ham on 09/22/2019 15:37:07

## 2019-10-04 ENCOUNTER — Ambulatory Visit (INDEPENDENT_AMBULATORY_CARE_PROVIDER_SITE_OTHER): Payer: Medicare Other | Admitting: Urology

## 2019-10-04 ENCOUNTER — Encounter: Payer: Self-pay | Admitting: Urology

## 2019-10-04 ENCOUNTER — Other Ambulatory Visit: Payer: Self-pay

## 2019-10-04 VITALS — BP 110/72 | HR 80 | Temp 97.9°F | Ht 73.0 in | Wt 212.0 lb

## 2019-10-04 DIAGNOSIS — N319 Neuromuscular dysfunction of bladder, unspecified: Secondary | ICD-10-CM | POA: Diagnosis not present

## 2019-10-04 NOTE — Patient Instructions (Signed)
Neurogenic Bladder  Neurogenic bladder is a bladder control disorder. It is usually caused by problems with the nerves that control the bladder. Your brain sends signals through your spinal cord to the muscles in your bladder that start and stop urine flow. If you have neurogenic bladder, the nerves and muscles do not work together the way they should. This condition may make the bladder overactive, meaning you have trouble holding urine. In other cases, it may make the bladder underactive, meaning you have trouble passing urine. What are the causes? This condition may be caused by any kind of nerve damage or condition that disrupts the signals from your brain to your bladder. Many things can cause these nerve problems, including:  A disease that affects the nervous system, such as: ? Alzheimer disease. ? Cerebral palsy. ? Multiple sclerosis. ? Diabetes. ? Parkinson disease.  Damage to your brain or spinal cord. This can come from: ? Trauma. ? Tumors. ? Infection. ? Surgery. ? Alcohol abuse. ? Stroke. ? A congenital disability that affects the spinal cord. What increases the risk? You are more likely to develop this condition if you have nerve damage or a nerve disorder. What are the signs or symptoms? Signs and symptoms of this condition include:  Leaking or gushing urine (incontinence).  A sudden, strong urge to pass urine (urgency).  Frequent urination during the day and night.  Being unable to empty your bladder completely (urinary retention).  Frequent urinary tract infections. How is this diagnosed? This condition may be diagnosed based on:  Your symptoms and medical history.  A physical exam.  Results of a bladder diary. You may be asked to keep a record of your bladder symptoms and the times that you urinate. You may also have tests, such as:  A urine test to check for infection.  A bladder scan after you urinate to see how much urine is left in your  bladder.  Tests to measure your urine flow and see how well the flow is controlled (urodynamic tests).  A procedure that uses a small device with a camera to look through your urethra into your bladder (cystoscopy). A health care provider who specializes in the urinary tract (urologist) may do this test.  Imaging tests of your brain or spine, such as MRI or CT. How is this treated? Treatment for this condition depends on the cause and the symptoms that you have. Work closely with your health care provider to find the treatments that will improve your quality of life. Treatment options include:  Learning ways to control when you urinate, such as: ? Urinating at scheduled times. ? Training yourself to delay urination. ? Doing exercises to strengthen the muscles that control urine flow (Kegel exercises). ? Avoiding foods or drinks that make your symptoms worse.  Taking medicines to: ? Stimulate an underactive bladder. ? Relax an overactive bladder. ? Treat a urinary tract infection.  Learning how to use a thin tube (catheter) to empty your bladder. A catheter is a hollow tube that you pass through your urethra.  Procedures to stimulate the nerves that control your bladder.  Surgery, if other treatments do not help. Follow these instructions at home: Lifestyle  Keep a bladder diary to find out which foods, liquids, or activities make your symptoms worse.  Use your bladder diary to schedule bathroom trips. If you are away from home, plan to be near a bathroom when your schedule says you will need one.  Limit your drinking of beverages   that stimulate urination. These include soda, coffee, and tea.  After urinating, wait a few minutes and try again (double voiding).  Make sure you urinate just before you leave the house and just before you go to bed. Kegel exercises Do Kegel exercises to strengthen the muscles that control the passing of urine. These muscles are the ones you use to  try to hold urine when you need to urinate. To do Kegel exercises: 1. Squeeze your pelvic floor muscles tight, as if you are trying to stop the flow of urine. You should feel a tight lift in your rectal area. If you are male, you should also feel a tightness in your vaginal area. Keep your stomach, buttocks, and legs relaxed. 2. Hold the muscles tight for 5-10 seconds. 3. Relax your muscles for the same amount of time. 4. Repeat 10 times. Repeat this exercise 3 times a day or as many times as told by your health care provider. General instructions  Take over-the-counter and prescription medicines only as told by your health care provider.  Keep all follow-up visits as told by your health care provider. This is important. Contact a health care provider if:  You are having a hard time controlling your symptoms.  Your symptoms are getting worse.  You have signs of a urinary tract infection. These may include: ? A burning feeling when you urinate. ? Chills. ? Fever. Get help right away if:  You cannot pass urine. Summary  Neurogenic bladder is a bladder control disorder caused by problems with the nerves that control the bladder. This condition may make the bladder overactive or underactive.  This condition may be caused by any kind of nerve damage or condition that disrupts the signals from your brain to your bladder.  Treatment depends on the cause of your neurogenic bladder and the symptoms that you have. Work closely with your health care provider to find the treatments that will improve your quality of life. This information is not intended to replace advice given to you by your health care provider. Make sure you discuss any questions you have with your health care provider. Document Revised: 05/03/2017 Document Reviewed: 05/03/2017 Elsevier Patient Education  2020 [employer]  

## 2019-10-04 NOTE — Progress Notes (Signed)
10/04/2019 10:59 AM   [name] May 20, 1960 [id number]  Referring provider: No referring provider defined for this encounter.  Urinary incontinence  HPI: [name] is a 59yo here for followup for neurogenic bladder with indwelling SP tube. His bladder is managed with an SP tube which is changed monthly. No recent UTI. No hematuria. No pelvic pain. NO recent incontinence around SP tube. He is on mirabegron 50mg     His records from [facility] are as follows: I have a neurogenic bladder. HPI: [name] is a 59 year-old male established patient who is here for a neurogenic bladder.  The event occurred approximately 11/02/2007. 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   He does not have to strain or bear down to start his urinary stream. He does have an abnormal sensation when needing to urinate. He is having problems with urinary control or incontinence. He does not have recurrent infections.   His neurogenic bladder has been treated with suprapubic tube. He is not happy with the current treatment strategy.   His last radiologic test to evaluate the kidneys was 11/14/2015.   11/20/2015: He underwent UDS which showed a small bladder capacity of 50-60cc. DLPP 14-21cm H2O and multiple uninhibited detrusor contractions. He is currently on ditropan 5mg  XR daily   04/08/2016: Pt was started on vesicare 5mg  daily and per caregiver the patient is not leaking around the catheter anymore. No UTIs.   10/07/2016: He is currently on vesicare 5mg . He had a UTI last visit which was treated with cipro. No UTIs since last visit. No issues with leakage around sp tube. NO issues with SP tube changes.   04/14/2017: Renal US shows no hydronephrosis. Pt presented to the ER over 1 week ago and foley was replaced due to SP tube not draining. . foley draining well.   08/04/2017: s/p SP tube replacement   11/03/2017: He has been having leakage around SP tube for an unknown length of time. NO fevers. NO abdominal pain.  No hematuria   01/05/2018: He was seen at [facility] 1 month ago with concern for UTI. He has leakage around the SP and has his bandage changed every 1-2 days. She states he has pain around the SP tube      PMH: Past Medical History:  Diagnosis Date  . Anemia   . Chronic indwelling Foley catheter   . Depression   . Diabetes mellitus    Type II  . DVT (deep venous thrombosis) ([place])   . GERD (gastroesophageal reflux disease)   . Hemiplegia ([place])   . Hyperlipemia   . Hypertension   . Neurogenic bladder   . OCD (obsessive compulsive disorder)   . Pneumonia   . Seizures ([place])    since a head injury in high school- no recent sezizures  . Stroke [facility])    deficit in right arm & right leg    Surgical History: Past Surgical History:  Procedure Laterality Date  . EYE SURGERY     cataract removal left eye  . INSERTION OF SUPRAPUBIC CATHETER N/A 06/23/2017   Procedure: INSERTION OF SUPRAPUBIC CATHETER;  Surgeon: Cleon Gustin, MD;  Location: [facility];  Service: Urology;  Laterality: N/A;  . MULTIPLE EXTRACTIONS WITH ALVEOLOPLASTY N/A 05/30/2013   Procedure: MULTIPLE EXTRACTION Teeth number Six, Eight, Nine, Ten, Eleven, Thirteen, Nineteen, and Twenty Eight  WITH ALVEOLOPLASTY;  Surgeon: Gae Bon, DDS;  Location: [facility];  Service: Oral Surgery;  Laterality: N/A;  . MULTIPLE TOOTH EXTRACTIONS    .  SUPRAPUBIC CATHETER INSERTION  2009  . TOOTH EXTRACTION N/A 10/16/2016   Procedure: DENTAL RESTORATION/EXTRACTIONS NUMBER FIVE, FOURTEEN, TWENTY-TWO, TWENTY- THREE, TWENTY-FOUR, TWENTY-FIVE, TWENTY-SIX, TWENTY-SEVEN, THIRTY-ONE, AND ALVEOLOPLASTY.;  Surgeon: Ocie DoyneJensen, Scott, DDS;  Location: [facility];  Service: Oral Surgery;  Laterality: N/A;  . TRACHEOSTOMY  2009   also removal of     Home Medications:  Allergies as of 10/04/2019      Reactions   No Known Allergies       Medication List       Accurate as of October 04, 2019 10:59 AM. If you have any questions, ask your nurse or doctor.          acetaminophen 500 MG tablet Commonly known as: TYLENOL Take 500 mg by mouth every 8 (eight) hours as needed for moderate pain.   Tylenol 325 MG Caps Generic drug: Acetaminophen Take 650 mg by mouth daily before breakfast.   aspirin 81 MG chewable tablet Chew 81 mg by mouth every morning.   baclofen 10 MG tablet Commonly known as: LIORESAL Take 15 mg by mouth 3 (three) times daily.   barrier cream Crea Commonly known as: non-specified Apply 1 application topically as needed (APPLY AFTER EACH CHANGING).   BIOFREEZE EX Apply 1 application topically 2 (two) times daily as needed (FOR HIP/LEG PAIN). 5% TOPICAL SOLUTION   bisacodyl 5 MG EC tablet Commonly known as: DULCOLAX Take 10 mg by mouth daily as needed (FOR CONSTIPATION).   brimonidine 0.2 % ophthalmic solution Commonly known as: ALPHAGAN Place 1 drop into both eyes 2 (two) times daily.   calcium-vitamin D 500-200 MG-UNIT tablet Commonly known as: OSCAL WITH D Take 1 tablet by mouth.   Calmoseptine 0.44-20.6 % Oint Generic drug: Menthol-Zinc Oxide Apply  liberally to affected area as directed  apply to buttocks/gluteal folds every shift   cephALEXin 500 MG capsule Commonly known as: KEFLEX Take 1 capsule (500 mg total) by mouth 4 (four) times daily.   Certagen tablet Take 1 tablet by mouth daily.   clonazePAM 0.5 MG tablet Commonly known as: KLONOPIN Take 0.5 mg by mouth 2 (two) times daily as needed.   divalproex 500 MG DR tablet Commonly known as: DEPAKOTE Take 500 mg by mouth 4 (four) times daily.   escitalopram 20 MG tablet Commonly known as: LEXAPRO Take 20 mg by mouth every morning.   ezetimibe 10 MG tablet Commonly known as: ZETIA Take 10 mg by mouth every morning.   famotidine 20 MG tablet Commonly known as: PEPCID Take 20 mg by mouth every morning.   feeding supplement (PRO-STAT SUGAR FREE 64) Liqd Take 30 mLs by mouth every morning.   ferrous sulfate 325 (65 FE) MG tablet Take 325 mg  by mouth daily with breakfast.   folic acid 1 MG tablet Commonly known as: FOLVITE Take 1 mg by mouth daily.   guaiFENesin-dextromethorphan 100-10 MG/5ML syrup Commonly known as: ROBITUSSIN DM Take 10 mLs by mouth every 4 (four) hours as needed for cough.   insulin glargine 100 UNIT/ML injection Commonly known as: LANTUS Inject 95 Units into the skin every morning.   insulin lispro 100 UNIT/ML injection Commonly known as: HUMALOG Inject 12 Units into the skin 3 (three) times daily before meals.   lisinopril 2.5 MG tablet Commonly known as: ZESTRIL Take 2.5 mg by mouth every morning.   metFORMIN 1000 MG tablet Commonly known as: GLUCOPHAGE Take 1,000-1,500 mg by mouth 2 (two) times daily. 1000 MG IN THE EVENING & 1500 MG IN  THE MORNING (1000 &1700)   Milk of Magnesia 400 MG/5ML suspension Generic drug: magnesium hydroxide SMARTSIG:30 Milliliter(s) By Mouth Every Night PRN   MULTIGEN PO Take 1 tablet by mouth every morning.   Myrbetriq 50 MG Tb24 tablet Generic drug: mirabegron ER Take 50 mg by mouth daily.   omega-3 acid ethyl esters 1 g capsule Commonly known as: LOVAZA Take 2 g by mouth 2 (two) times daily. (1000&2200)   ondansetron 4 MG disintegrating tablet Commonly known as: ZOFRAN-ODT Take 4 mg by mouth every 4 (four) hours as needed for nausea or vomiting.   Oxycodone HCl 10 MG Tabs Take 1 tablet (10 mg total) by mouth every 4 (four) hours as needed for severe pain or breakthrough pain.   polyvinyl alcohol 1.4 % ophthalmic solution Commonly known as: LIQUIFILM TEARS Place 1 drop into both eyes 2 (two) times daily.   pregabalin 100 MG capsule Commonly known as: LYRICA Take 100 mg by mouth 3 (three) times daily.   rosuvastatin 20 MG tablet Commonly known as: CRESTOR Take 20 mg by mouth daily.   senna-docusate 8.6-50 MG tablet Commonly known as: Senokot-S Take 2 tablets by mouth daily at 10 pm.   solifenacin 10 MG tablet Commonly known as:  VESICARE Take 10 mg by mouth every morning.   vitamin C 500 MG tablet Commonly known as: ASCORBIC ACID Take 500 mg by mouth 2 (two) times daily.   Vitamin D (Ergocalciferol) 1.25 MG (50000 UNIT) Caps capsule Commonly known as: DRISDOL Take 50,000 Units by mouth every 30 (thirty) days.       Allergies:  Allergies  Allergen Reactions  . No Known Allergies     Family History: No family history on file.  Social History:  reports that he has quit smoking. He has never used smokeless tobacco. He reports that he does not drink alcohol or use drugs.  ROS: All other review of systems were reviewed and are negative except what is noted above in HPI  Physical Exam: BP 110/72   Pulse 80   Temp 97.9 F (36.6 C)   Ht 6\' 1"  (1.854 m)   Wt 212 lb (96.2 kg)   BMI 27.97 kg/m   Constitutional:  Alert and oriented, No acute distress. HEENT: [place] AT, moist mucus membranes.  Trachea midline, no masses. Cardiovascular: No clubbing, cyanosis, or edema. Respiratory: Normal respiratory effort, no increased work of breathing. GI: Abdomen is soft, nontender, nondistended, no abdominal masses. 22 french SP tube in place. No granulation tissue around SP GU: No CVA tenderness.  Lymph: No cervical or inguinal lymphadenopathy. Skin: No rashes, bruises or suspicious lesions. Neurologic: Grossly intact, no focal deficits, moving all 4 extremities. Psychiatric: Normal mood and affect.  Laboratory Data: Lab Results  Component Value Date   WBC 8.0 12/18/2017   HGB 10.7 (L) 12/18/2017   HCT 33.8 (L) 12/18/2017   MCV 90.6 12/18/2017   PLT 123 (L) 12/18/2017    Lab Results  Component Value Date   CREATININE 0.87 12/18/2017    No results found for: PSA  No results found for: TESTOSTERONE  Lab Results  Component Value Date   HGBA1C (H) 02/23/2007    11.9 (NOTE)   The ADA recommends the following therapeutic goals for glycemic   control related to Hgb A1C measurement:   Goal of Therapy:   <  7.0% Hgb A1C   Action Suggested:  > 8.0% Hgb A1C   Ref:  Diabetes Care, 22, Suppl. 1, 1999  Urinalysis    Component Value Date/Time   COLORURINE YELLOW 12/18/2017 0112   APPEARANCEUR CLOUDY (A) 12/18/2017 0112   LABSPEC 1.009 12/18/2017 0112   PHURINE 8.0 12/18/2017 0112   GLUCOSEU NEGATIVE 12/18/2017 0112   HGBUR LARGE (A) 12/18/2017 0112   BILIRUBINUR NEGATIVE 12/18/2017 0112   KETONESUR 5 (A) 12/18/2017 0112   PROTEINUR 100 (A) 12/18/2017 0112   UROBILINOGEN 0.2 06/17/2007 1435   NITRITE NEGATIVE 12/18/2017 0112   LEUKOCYTESUR LARGE (A) 12/18/2017 0112    Lab Results  Component Value Date   BACTERIA NONE SEEN 12/18/2017    Pertinent Imaging:  No results found for this or any previous visit. No results found for this or any previous visit. No results found for this or any previous visit. No results found for this or any previous visit. Results for orders placed during the hospital encounter of 03/29/17  US RENAL   Narrative CLINICAL DATA:  Neuropathic bladder.  EXAM: RENAL / URINARY TRACT ULTRASOUND COMPLETE  COMPARISON:  November 14, 2015  FINDINGS: Right Kidney:  Length: 11.8 cm. The right kidney is poorly visualized due to shadowing bowel gas. However, there is suggestion of caliectasis in the upper pole.  Left Kidney:  Length: 12.7 cm. Echogenicity within normal limits. No mass or hydronephrosis visualized.  Bladder:  Decompressed with a Foley catheter.  IMPRESSION: 1. The right kidney is poorly visualized. However, there is suggested caliectasis in the upper pole on image 3 of 34. 2. The left kidney is normal in appearance. 3. The bladder is decompressed with a Foley catheter.   Electronically Signed   By: Gerome Sam III M.D   On: 03/29/2017 16:30    No results found for this or any previous visit. No results found for this or any previous visit. Results for orders placed during the hospital encounter of 12/18/17  CT Renal Stone Study    Narrative CLINICAL DATA:  Patient's catheter was manipulated with no urine in the bag period after manipulation, patient started passing blood and bleeding from the penis. History of neurogenic bladder.  EXAM: CT ABDOMEN AND PELVIS WITHOUT CONTRAST  TECHNIQUE: Multidetector CT imaging of the abdomen and pelvis was performed following the standard protocol without IV contrast.  COMPARISON:  None.  FINDINGS: Lower chest: Atelectasis in the lung bases. Coronary artery calcifications.  Hepatobiliary: No focal liver abnormality is seen. No gallstones, gallbladder wall thickening, or biliary dilatation.  Pancreas: Unremarkable. No pancreatic ductal dilatation or surrounding inflammatory changes.  Spleen: Normal in size without focal abnormality.  Adrenals/Urinary Tract: No adrenal gland nodules. Kidneys are symmetrical in size. No renal or ureteral stones are seen. There is hydronephrosis and hydroureter on the right with stranding around the right kidney and ureter and with right ureteral wall thickening. In the absence of obvious stones, this could represent obstruction due to an occult non radiopaque stone, reflux with pyelonephritis, or distal stricture. Left kidney and ureter are unremarkable. There is a suprapubic catheter passing through the bladder with the balloon demonstrated in the posterior urethra distal to the prostate gland. The bladder is incompletely distended. Urine within the bladder is heterogeneous and hyperdense, likely representing hemorrhage.  Stomach/Bowel: Stomach, small bowel, and colon are not abnormally distended. Stool diffusely throughout the colon. Prominent stool in the rectum without significant rectal wall thickening. No inflammatory changes are appreciated. Appendix is normal.  Vascular/Lymphatic: Aortic atherosclerosis. No enlarged abdominal or pelvic lymph nodes.  Reproductive: Prostate is unremarkable. Calcification in the  seminal vesicles.  Other: No  free air or free fluid in the abdomen.  Musculoskeletal: Degenerative changes in the spine. No destructive bone lesions.  IMPRESSION: 1. Suprapubic catheter extends through the bladder and urethra with the balloon in the posterior urethra distal to the prostate gland. 2. Bladder is incompletely distended. Urine within the bladder is heterogeneous and hyperdense, likely representing hemorrhage. 3. Right hydronephrosis and hydroureter with stranding around the right kidney and ureter. In the absence of obvious stones, this could represent obstruction due to an occult non radiopaque stone, reflux with pyelonephritis, or distal stricture. 4. Aortic atherosclerosis.   Electronically Signed   By: Burman Nieves M.D.   On: 12/18/2017 02:23     Assessment & Plan:    1. Neurogenic bladder -Continue monthly SP tube changes -Continue mirabegron 50mg  daily -RTC 1 year  No follow-ups on file.  , MD  [facility] [place]

## 2019-10-05 NOTE — Progress Notes (Signed)
[name], Frank D. ([id number]) Visit Report for 09/22/2019 Arrival Information Details Patient Name: Date of Service: [name], [name] 09/22/2019 2:15 PM Medical Record Number: [id number] Patient Account Number: [id number] Date of Birth/Sex: Treating RN: 08-20-1960 (59 y.o. Frank RightM) Dwiggins, Shannon Primary Care [name]: SYSTEM, PCP Other Clinician: Referring [name]: Treating [name]/Extender: Ky Barbanobson, Michael Moore, Donald Weeks in Treatment: 18 Visit Information History Since Last Visit Added or deleted any medications: No Patient Arrived: Wheel Chair Any new allergies or adverse reactions: No Arrival Time: 14:20 Had a fall or experienced change in No Accompanied By: caregiver activities of daily living that may affect Transfer Assistance: None risk of falls: Patient Identification Verified: Yes Signs or symptoms of abuse/neglect since last visito No Secondary Verification Process Completed: Yes Hospitalized since last visit: No Patient Requires Transmission-Based Precautions: No Implantable device outside of the clinic excluding No Patient Has Alerts: Yes cellular tissue based products placed in the center Patient Alerts: R and L ABIs N/C since last visit: Has Dressing in Place as Prescribed: Yes Pain Present Now: No Electronic Signature(s) Signed: 10/05/2019 9:13:31 AM By: Karl Itoawkins, Destiny Entered By: Karl Itoawkins, Destiny on 09/22/2019 14:22:54 -------------------------------------------------------------------------------- Clinic Level of Care Assessment Details Patient Name: Date of Service: [name], [name] [place] 09/22/2019 2:15 PM Medical Record Number: [id number] Patient Account Number: [id number] Date of Birth/Sex: Treating RN: 08-20-1960 (59 y.o. Frank RightM) Dwiggins, Shannon Primary Care [name]: SYSTEM, PCP Other Clinician: Referring [name]: Treating [name]/Extender: Ky Barbanobson, Michael Moore, Donald Weeks in Treatment: 18 Clinic Level of Care Assessment Items TOOL 4 Quantity  Score X- 1 0 Use when only an EandM is performed on FOLLOW-UP visit ASSESSMENTS - Nursing Assessment / Reassessment X- 1 10 Reassessment of Co-morbidities (includes updates in patient status) X- 1 5 Reassessment of Adherence to Treatment Plan ASSESSMENTS - Wound and Skin A ssessment / Reassessment []  - 0 Simple Wound Assessment / Reassessment - one wound X- 3 5 Complex Wound Assessment / Reassessment - multiple wounds []  - 0 Dermatologic / Skin Assessment (not related to wound area) ASSESSMENTS - Focused Assessment X- 1 5 Circumferential Edema Measurements - multi extremities []  - 0 Nutritional Assessment / Counseling / Intervention []  - 0 Lower Extremity Assessment (monofilament, tuning fork, pulses) []  - 0 Peripheral Arterial Disease Assessment (using hand held doppler) ASSESSMENTS - Ostomy and/or Continence Assessment and Care []  - 0 Incontinence Assessment and Management []  - 0 Ostomy Care Assessment and Management (repouching, etc.) PROCESS - Coordination of Care X - Simple Patient / Family Education for ongoing care 1 15 []  - 0 Complex (extensive) Patient / Family Education for ongoing care X- 1 10 Staff obtains ChiropractorConsents, Records, T Results / Process Orders est X- 1 10 Staff telephones HHA, Nursing Homes / Clarify orders / etc []  - 0 Routine Transfer to another Facility (non-emergent condition) []  - 0 Routine Hospital Admission (non-emergent condition) []  - 0 New Admissions / Manufacturing engineernsurance Authorizations / Ordering NPWT Apligraf, etc. , []  - 0 Emergency Hospital Admission (emergent condition) X- 1 10 Simple Discharge Coordination []  - 0 Complex (extensive) Discharge Coordination PROCESS - Special Needs []  - 0 Pediatric / Minor Patient Management []  - 0 Isolation Patient Management []  - 0 Hearing / Language / Visual special needs []  - 0 Assessment of Community assistance (transportation, D/C planning, etc.) []  - 0 Additional assistance / Altered  mentation []  - 0 Support Surface(s) Assessment (bed, cushion, seat, etc.) INTERVENTIONS - Wound Cleansing / Measurement []  - 0 Simple Wound Cleansing - one wound X- 3 5  Complex Wound Cleansing - multiple wounds X- 1 5 Wound Imaging (photographs - any number of wounds) []  - 0 Wound Tracing (instead of photographs) []  - 0 Simple Wound Measurement - one wound X- 3 5 Complex Wound Measurement - multiple wounds INTERVENTIONS - Wound Dressings X - Small Wound Dressing one or multiple wounds 3 10 []  - 0 Medium Wound Dressing one or multiple wounds []  - 0 Large Wound Dressing one or multiple wounds X- 1 5 Application of Medications - topical []  - 0 Application of Medications - injection INTERVENTIONS - Miscellaneous []  - 0 External ear exam []  - 0 Specimen Collection (cultures, biopsies, blood, body fluids, etc.) []  - 0 Specimen(s) / Culture(s) sent or taken to Lab for analysis []  - 0 Patient Transfer (multiple staff / / Similar devices) []  - 0 Simple Staple / Suture removal (25 or less) []  - 0 Complex Staple / Suture removal (26 or more) []  - 0 Hypo / Hyperglycemic Management (close monitor of Blood Glucose) []  - 0 Ankle / Brachial Index (ABI) - do not check if billed separately X- 1 5 Vital Signs Has the patient been seen at the hospital within the last three years: Yes Total Score: 155 Level Of Care: New/Established - Level 4 Electronic Signature(s) Signed: 09/22/2019 5:37:38 PM By: Entered By: on 09/22/2019 15:26:50 -------------------------------------------------------------------------------- Encounter Discharge Information Details Patient Name: Date of Service: , EN D. 09/22/2019 2:15 PM Medical Record Number: Patient Account Number: Nurse, adult Date of Birth/Sex: Treating RN: [birth year] (59 y.o. Primary Care [name]: SYSTEM, PCP Other Clinician: Referring [name]: Treating  [name]/Extender: in Treatment: 18 Encounter Discharge Information Items Discharge Condition: Stable Ambulatory Status: Wheelchair Discharge Destination: Skilled Nursing Facility Telephoned: No Orders Sent: Yes Transportation: Other Accompanied By: facility staff Schedule Follow-up Appointment: Yes Clinical Summary of Care: Patient Declined Notes facility transportation Electronic Signature(s) Signed: 09/22/2019 5:34:55 PM By: Cherylin Mylar RN, BSN Entered By: Cherylin Mylar on 09/22/2019 15:41:28 -------------------------------------------------------------------------------- Lower Extremity Assessment Details Patient Name: Date of Service: [name], Frank [place] EN D. 09/22/2019 2:15 PM Medical Record Number: [id number] Patient Account Number: [id number] Date of Birth/Sex: Treating RN: 1960-05-23 (59 y.o. [name] Primary Care Jasier Calabretta: SYSTEM, PCP Other Clinician: Referring Javaria Knapke: Treating Micaella Gitto/Extender: Ky Barban in Treatment: 18 Edema Assessment Assessed: 09/24/2019: No] Zenaida Deed: No] Edema: [Left: N] [[name]: o] Calf Left: [name]: Point of Measurement: cm From Medial Instep 28 cm cm Ankle Left: [name]: Point of Measurement: cm From Medial Instep 18.7 cm cm Vascular Assessment Pulses: Dorsalis Pedis Palpable: [Left:Yes] Electronic Signature(s) Signed: 09/22/2019 5:34:55 PM By: 09/24/2019 RN, BSN Entered By: [name] on 09/22/2019 14:43:52 -------------------------------------------------------------------------------- Multi Wound Chart Details Patient Name: Date of Service: 09/24/2019, [id number] EN D. 09/22/2019 2:15 PM Medical Record Number: 10/18/1960 Patient Account Number: [id number] Date of Birth/Sex: Treating RN: 1961-02-02 (59 y.o. [name] Primary Care Tavaria Mackins: SYSTEM, PCP Other Clinician: Referring Starling Jessie: Treating Kenni Newton/Extender: Franne Forts in  Treatment: 18 Vital Signs Height(in): 73 Pulse(bpm): 81 Weight(lbs): 207 Blood Pressure(mmHg): 127/57 Body Mass Index(BMI): 27 Temperature(F): 98.8 Respiratory Rate(breaths/min): 18 Photos: [3:No Photos Left, Lateral Foot] [6:No Photos Left T Third oe] [7:No Photos Left T Second oe] Wound Location: [3:Gradually Appeared] [6:Gradually Appeared] [7:Gradually Appeared] Wounding Event: [3:Diabetic Wound/Ulcer of the Lower] [6:Diabetic Wound/Ulcer of the Lower] [7:Diabetic Wound/Ulcer of the Lower] Primary Etiology: [3:Extremity Glaucoma, Coronary Artery Disease,] [6:Extremity Glaucoma, Coronary Artery Disease,] [7:Extremity  Glaucoma, Coronary Artery Disease,] Comorbid History: [3:Deep Vein Thrombosis, Hypertension, Peripheral Arterial Disease, Type II Diabetes, Dementia, Neuropathy, Seizure Disorder 03/05/2019] [6:Deep Vein Thrombosis, Hypertension, Peripheral Arterial Disease, Type II Diabetes, Dementia,  Neuropathy, Seizure Disorder 06/30/2019] [7:Deep Vein Thrombosis, Hypertension, Peripheral Arterial Disease, Type II Diabetes, Dementia, Neuropathy, Seizure Disorder 09/22/2019] Date Acquired: [3:18] [6:12] [7:0] Weeks of Treatment: [3:Open] [6:Open] [7:Open] Wound Status: [3:0.4x0.3x0.4] [6:0x0x0] [7:0.7x0.3x0.1] Measurements L x W x D (cm) [3:0.094] [6:0] [7:0.165] A (cm) : rea [3:0.038] [6:0] [7:0.016] Volume (cm) : [3:96.80%] [6:100.00%] [7:N/A] % Reduction in A rea: [3:99.30%] [6:100.00%] [7:N/A] % Reduction in Volume: [3:9] Position 1 (o'clock): [3:1.2] Maximum Distance 1 (cm): [3:Yes] [6:No] [7:No] Tunneling: [3:Grade 2] [6:Grade 2] [7:Grade 2] Classification: [3:Small] [6:None Present] [7:None Present] Exudate A mount: [3:Serosanguineous] [6:N/A] [7:N/A] Exudate Type: [3:red, brown] [6:N/A] [7:N/A] Exudate Color: [3:Well defined, not attached] [6:Flat and Intact] [7:Flat and Intact] Wound Margin: [3:Large (67-100%)] [6:None Present (0%)] [7:None Present (0%)] Granulation A  mount: [3:Red] [6:N/A] [7:N/A] Granulation Quality: [3:None Present (0%)] [6:None Present (0%)] [7:Large (67-100%)] Necrotic A mount: [3:N/A] [6:N/A] [7:Eschar] Necrotic Tissue: [3:Fat Layer (Subcutaneous Tissue)] [6:Fascia: No] [7:Fat Layer (Subcutaneous Tissue)] Exposed Structures: [3:Exposed: Yes Fascia: No Tendon: No Muscle: No Joint: No Bone: No Medium (34-66%)] [6:Fat Layer (Subcutaneous Tissue) Exposed: No Tendon: No Muscle: No Joint: No Bone: No Large (67-100%)] [7:Exposed: Yes Fascia: No Tendon: No Muscle: No Joint: No Bone:  No None] Epithelialization: Treatment Notes Electronic Signature(s) Signed: 09/22/2019 5:37:38 PM By: Kela Millin Signed: 09/22/2019 6:00:23 PM By: Linton Ham MD Entered By: Linton Ham on 09/22/2019 15:33:02 -------------------------------------------------------------------------------- Multi-Disciplinary Care Plan Details Patient Name: Date of Service: [name], Frank Kennedy EN D. 09/22/2019 2:15 PM Medical Record Number: [id number] Patient Account Number: [id number] Date of Birth/Sex: Treating RN: Feb 12, 1961 (59 y.o. [name] Primary Care Kirin Pastorino: SYSTEM, PCP Other Clinician: Referring Glora Hulgan: Treating Jasmynn Pfalzgraf/Extender: Thea Silversmith in Treatment: 18 Active Inactive Nutrition Nursing Diagnoses: Imbalanced nutrition Goals: Patient/caregiver will maintain therapeutic glucose control Date Initiated: 05/19/2019 Target Resolution Date: 09/29/2019 Goal Status: Active Interventions: Provide education on elevated blood sugars and impact on wound healing Notes: Wound/Skin Impairment Nursing Diagnoses: Impaired tissue integrity Goals: Ulcer/skin breakdown will have a volume reduction of 50% by week 8 Date Initiated: 05/19/2019 Target Resolution Date: 09/29/2019 Goal Status: Active Interventions: Provide education on ulcer and skin care Notes: Electronic Signature(s) Signed: 09/22/2019 5:37:38 PM By:  Kela Millin Entered By: Kela Millin on 09/22/2019 15:25:33 -------------------------------------------------------------------------------- Pain Assessment Details Patient Name: Date of Service: [name], Frank Kennedy EN D. 09/22/2019 2:15 PM Medical Record Number: [id number] Patient Account Number: [id number] Date of Birth/Sex: Treating RN: [birth year] (59 y.o. [name] Primary Care Emmry Hinsch: SYSTEM, PCP Other Clinician: Referring Mayda Shippee: Treating Praveen Coia/Extender: Thea Silversmith in Treatment: 18 Active Problems Location of Pain Severity and Description of Pain Patient Has Paino No Site Locations Pain Management and Medication Current Pain Management: Electronic Signature(s) Signed: 09/22/2019 5:37:38 PM By: Kela Millin Signed: 10/05/2019 9:13:31 AM By: Sandre Kitty Entered By: Sandre Kitty on 09/22/2019 14:23:16 -------------------------------------------------------------------------------- Patient/Caregiver Education Details Patient Name: Date of Service: Frank Ours EN D. 5/21/2021andnbsp2:15 PM Medical Record Number: [id number] Patient Account Number: [id number] Date of Birth/Gender: Treating RN: 04-04-1961 (59 y.o. [name] Primary Care Physician: SYSTEM, PCP Other Clinician: Referring Physician: Treating Physician/Extender: Thea Silversmith in Treatment: 18 Education Assessment Education Provided To: Patient Education Topics Provided Elevated Blood Sugar/ Impact on Healing: Handouts: Elevated Blood Sugars: How Do They Affect Wound Healing Methods:  Explain/Verbal Responses: State content correctly Wound/Skin Impairment: Handouts: Caring for Your Ulcer Methods: Explain/Verbal Responses: State content correctly Electronic Signature(s) Signed: 09/22/2019 5:37:38 PM By: Cherylin Mylar Entered By: Cherylin Mylar on 09/22/2019  15:25:50 -------------------------------------------------------------------------------- Wound Assessment Details Patient Name: Date of Service: [name], Frank [place] EN D. 09/22/2019 2:15 PM Medical Record Number: [id number] Patient Account Number: [id number] Date of Birth/Sex: Treating RN: 11-05-1960 (59 y.o. [name] Primary Care Rune Mendez: SYSTEM, PCP Other Clinician: Referring Malayna Noori: Treating Evely Gainey/Extender: Ky Barban in Treatment: 18 Wound Status Wound Number: 3 Primary Diabetic Wound/Ulcer of the Lower Extremity Etiology: Wound Location: Left, Lateral Foot Wound Open Wounding Event: Gradually Appeared Status: Date Acquired: 03/05/2019 Comorbid Glaucoma, Coronary Artery Disease, Deep Vein Thrombosis, Weeks Of Treatment: 18 History: Hypertension, Peripheral Arterial Disease, Type II Diabetes, Clustered Wound: No Dementia, Neuropathy, Seizure Disorder Photos Photo Uploaded By: Benjaman Kindler on 09/26/2019 10:35:50 Wound Measurements Length: (cm) 0.4 Width: (cm) 0.3 Depth: (cm) 0.4 Area: (cm) 0.094 Volume: (cm) 0.038 % Reduction in Area: 96.8% % Reduction in Volume: 99.3% Epithelialization: Medium (34-66%) Tunneling: Yes Position (o'clock): 9 Maximum Distance: (cm) 1.2 Undermining: No Wound Description Classification: Grade 2 Wound Margin: Well defined, not attached Exudate Amount: Small Exudate Type: Serosanguineous Exudate Color: red, brown Foul Odor After Cleansing: No Slough/Fibrino No Wound Bed Granulation Amount: Large (67-100%) Exposed Structure Granulation Quality: Red Fascia Exposed: No Necrotic Amount: None Present (0%) Fat Layer (Subcutaneous Tissue) Exposed: Yes Tendon Exposed: No Muscle Exposed: No Joint Exposed: No Bone Exposed: No Treatment Notes Wound #3 (Left, Lateral Foot) 3. Primary Dressing Applied Calcium Alginate Ag 4. Secondary Dressing Dry Gauze Roll Gauze Foam Electronic  Signature(s) Signed: 09/22/2019 5:34:55 PM By: Zenaida Deed RN, BSN Signed: 09/22/2019 5:37:38 PM By: Cherylin Mylar Entered By: Zenaida Deed on 09/22/2019 14:48:18 -------------------------------------------------------------------------------- Wound Assessment Details Patient Name: Date of Service: [name], Frank [place] EN D. 09/22/2019 2:15 PM Medical Record Number: [id number] Patient Account Number: [id number] Date of Birth/Sex: Treating RN: Aug 06, 1960 (59 y.o. [name] Primary Care Winona Sison: SYSTEM, PCP Other Clinician: Referring Zayda Angell: Treating Alexxander Kurt/Extender: Ky Barban in Treatment: 18 Wound Status Wound Number: 6 Primary Diabetic Wound/Ulcer of the Lower Extremity Etiology: Wound Location: Left T Third oe Wound Healed - Epithelialized Wounding Event: Gradually Appeared Status: Date Acquired: 06/30/2019 Comorbid Glaucoma, Coronary Artery Disease, Deep Vein Thrombosis, Weeks Of Treatment: 12 History: Hypertension, Peripheral Arterial Disease, Type II Diabetes, Clustered Wound: No Dementia, Neuropathy, Seizure Disorder Photos Photo Uploaded By: Benjaman Kindler on 09/26/2019 10:35:51 Wound Measurements Length: (cm) Width: (cm) Depth: (cm) Area: (cm) Volume: (cm) 0 % Reduction in Area: 100% 0 % Reduction in Volume: 100% 0 Epithelialization: Large (67-100%) 0 Tunneling: No 0 Undermining: No Wound Description Classification: Grade 2 Wound Margin: Flat and Intact Exudate Amount: None Present Foul Odor After Cleansing: No Slough/Fibrino No Wound Bed Granulation Amount: None Present (0%) Exposed Structure Necrotic Amount: None Present (0%) Fascia Exposed: No Fat Layer (Subcutaneous Tissue) Exposed: No Tendon Exposed: No Muscle Exposed: No Joint Exposed: No Bone Exposed: No Electronic Signature(s) Signed: 09/22/2019 5:34:55 PM By: Zenaida Deed RN, BSN Signed: 09/22/2019 5:37:38 PM By: Cherylin Mylar Entered By:  Cherylin Mylar on 09/22/2019 15:36:04 -------------------------------------------------------------------------------- Wound Assessment Details Patient Name: Date of Service: [name], Frank [place] EN D. 09/22/2019 2:15 PM Medical Record Number: [id number] Patient Account Number: [id number] Date of Birth/Sex: Treating RN: Apr 09, 1961 (59 y.o. [name] Primary Care Elyce Zollinger: SYSTEM, PCP Other Clinician: Referring Allora Bains: Treating Quenna Doepke/Extender: Ky Barban in Treatment:  18 Wound Status Wound Number: 7 Primary Diabetic Wound/Ulcer of the Lower Extremity Etiology: Wound Location: Left T Second oe Wound Open Wounding Event: Gradually Appeared Status: Date Acquired: 09/22/2019 Comorbid Glaucoma, Coronary Artery Disease, Deep Vein Thrombosis, Weeks Of Treatment: 0 History: Hypertension, Peripheral Arterial Disease, Type II Diabetes, Clustered Wound: No Dementia, Neuropathy, Seizure Disorder Photos Photo Uploaded By: Benjaman Kindler on 09/26/2019 10:36:12 Wound Measurements Length: (cm) 0.7 Width: (cm) 0.3 Depth: (cm) 0.1 Area: (cm) 0.165 Volume: (cm) 0.016 % Reduction in Area: % Reduction in Volume: Epithelialization: None Tunneling: No Undermining: No Wound Description Classification: Grade 2 Wound Margin: Flat and Intact Exudate Amount: None Present Foul Odor After Cleansing: No Slough/Fibrino No Wound Bed Granulation Amount: None Present (0%) Exposed Structure Necrotic Amount: Large (67-100%) Fascia Exposed: No Necrotic Quality: Eschar Fat Layer (Subcutaneous Tissue) Exposed: Yes Tendon Exposed: No Muscle Exposed: No Joint Exposed: No Bone Exposed: No Treatment Notes Wound #7 (Left Toe Second) 3. Primary Dressing Applied Calcium Alginate Ag 4. Secondary Dressing Dry Gauze Roll Gauze Foam Electronic Signature(s) Signed: 09/22/2019 5:34:55 PM By: Zenaida Deed RN, BSN Entered By: Zenaida Deed on 09/22/2019  14:46:50 -------------------------------------------------------------------------------- Vitals Details Patient Name: Date of Service: [name], Frank [place] EN D. 09/22/2019 2:15 PM Medical Record Number: [id number] Patient Account Number: [id number] Date of Birth/Sex: Treating RN: [birth year] (59 y.o. [name] Primary Care Shuntay Everetts: SYSTEM, PCP Other Clinician: Referring Jalexa Pifer: Treating Xaniyah Buchholz/Extender: Ky Barban in Treatment: 18 Vital Signs Time Taken: 14:22 Temperature (F): 98.8 Height (in): 73 Pulse (bpm): 81 Weight (lbs): 207 Respiratory Rate (breaths/min): 18 Body Mass Index (BMI): 27.3 Blood Pressure (mmHg): 127/57 Reference Range: 80 - 120 mg / dl Electronic Signature(s) Signed: 10/05/2019 9:13:31 AM By: Karl Ito Entered By: Karl Ito on 09/22/2019 14:23:11

## 2019-10-06 ENCOUNTER — Encounter (HOSPITAL_BASED_OUTPATIENT_CLINIC_OR_DEPARTMENT_OTHER): Payer: Medicare Other | Attending: Internal Medicine | Admitting: Internal Medicine

## 2019-10-06 DIAGNOSIS — E1139 Type 2 diabetes mellitus with other diabetic ophthalmic complication: Secondary | ICD-10-CM | POA: Diagnosis not present

## 2019-10-06 DIAGNOSIS — I2510 Atherosclerotic heart disease of native coronary artery without angina pectoris: Secondary | ICD-10-CM | POA: Diagnosis not present

## 2019-10-06 DIAGNOSIS — Z794 Long term (current) use of insulin: Secondary | ICD-10-CM | POA: Diagnosis not present

## 2019-10-06 DIAGNOSIS — Z888 Allergy status to other drugs, medicaments and biological substances status: Secondary | ICD-10-CM | POA: Diagnosis not present

## 2019-10-06 DIAGNOSIS — M86472 Chronic osteomyelitis with draining sinus, left ankle and foot: Secondary | ICD-10-CM | POA: Diagnosis not present

## 2019-10-06 DIAGNOSIS — E785 Hyperlipidemia, unspecified: Secondary | ICD-10-CM | POA: Insufficient documentation

## 2019-10-06 DIAGNOSIS — E1140 Type 2 diabetes mellitus with diabetic neuropathy, unspecified: Secondary | ICD-10-CM | POA: Diagnosis not present

## 2019-10-06 DIAGNOSIS — H42 Glaucoma in diseases classified elsewhere: Secondary | ICD-10-CM | POA: Insufficient documentation

## 2019-10-06 DIAGNOSIS — Z86718 Personal history of other venous thrombosis and embolism: Secondary | ICD-10-CM | POA: Diagnosis not present

## 2019-10-06 DIAGNOSIS — E1151 Type 2 diabetes mellitus with diabetic peripheral angiopathy without gangrene: Secondary | ICD-10-CM | POA: Insufficient documentation

## 2019-10-06 DIAGNOSIS — E1169 Type 2 diabetes mellitus with other specified complication: Secondary | ICD-10-CM | POA: Insufficient documentation

## 2019-10-06 DIAGNOSIS — G40909 Epilepsy, unspecified, not intractable, without status epilepticus: Secondary | ICD-10-CM | POA: Diagnosis not present

## 2019-10-06 DIAGNOSIS — M869 Osteomyelitis, unspecified: Secondary | ICD-10-CM | POA: Insufficient documentation

## 2019-10-06 DIAGNOSIS — I10 Essential (primary) hypertension: Secondary | ICD-10-CM | POA: Insufficient documentation

## 2019-10-06 DIAGNOSIS — L97524 Non-pressure chronic ulcer of other part of left foot with necrosis of bone: Secondary | ICD-10-CM | POA: Insufficient documentation

## 2019-10-06 DIAGNOSIS — F329 Major depressive disorder, single episode, unspecified: Secondary | ICD-10-CM | POA: Diagnosis not present

## 2019-10-06 DIAGNOSIS — E11621 Type 2 diabetes mellitus with foot ulcer: Secondary | ICD-10-CM | POA: Insufficient documentation

## 2019-10-09 NOTE — Progress Notes (Signed)
NICOLI, NARDOZZI ([id number]) Visit Report for 10/06/2019 Arrival Information Details Patient Name: Date of Service: Maryclare Bean [place] D. 10/06/2019 10:15 A M Medical Record Number: [id number] Patient Account Number: [id number] Date of Birth/Sex: Treating RN: 1961/02/20 (59 y.o. Elizebeth Koller Primary Care Zeidy Tayag: SYSTEM, PCP Other Clinician: Referring Yuto Cajuste: Treating Jannette Cotham/Extender: Ky Barban in Treatment: 20 Visit Information History Since Last Visit Added or deleted any medications: No Patient Arrived: Wheel Chair Any new allergies or adverse reactions: No Arrival Time: 10:51 Had a fall or experienced change in No Accompanied By: caregiver activities of daily living that may affect Transfer Assistance: None risk of falls: Patient Identification Verified: Yes Signs or symptoms of abuse/neglect since last visito No Secondary Verification Process Completed: Yes Hospitalized since last visit: No Patient Requires Transmission-Based Precautions: No Implantable device outside of the clinic excluding No Patient Has Alerts: Yes cellular tissue based products placed in the center Patient Alerts: R and L ABIs N/C since last visit: Has Dressing in Place as Prescribed: Yes Pain Present Now: No Electronic Signature(s) Signed: 10/09/2019 6:28:13 PM By: Zandra Abts RN, BSN Entered By: Zandra Abts on 10/06/2019 11:01:16 -------------------------------------------------------------------------------- Encounter Discharge Information Details Patient Name: Date of Service: Graceann Congress, [name] 10/06/2019 10:15 A M Medical Record Number: [id number] Patient Account Number: [id number] Date of Birth/Sex: Treating RN: 1960/05/10 (59 y.o. Elizebeth Koller Primary Care Navya Timmons: SYSTEM, PCP Other Clinician: Referring Alisan Dokes: Treating Kailand Seda/Extender: Ky Barban in Treatment: 20 Encounter Discharge Information Items Post Procedure  Vitals Discharge Condition: Stable Temperature (F): 98.4 Ambulatory Status: Wheelchair Pulse (bpm): 83 Discharge Destination: Home Respiratory Rate (breaths/min): 16 Transportation: Private Auto Blood Pressure (mmHg): 110/72 Accompanied By: caregiver Schedule Follow-up Appointment: Yes Clinical Summary of Care: Patient Declined Electronic Signature(s) Signed: 10/09/2019 6:28:13 PM By: Zandra Abts RN, BSN Entered By: Zandra Abts on 10/06/2019 13:46:50 -------------------------------------------------------------------------------- Lower Extremity Assessment Details Patient Name: Date of Service: Graceann Congress, [name] 10/06/2019 10:15 A M Medical Record Number: [id number] Patient Account Number: [id number] Date of Birth/Sex: Treating RN: 07-24-1960 (59 y.o. Elizebeth Koller Primary Care Jamaar Howes: SYSTEM, PCP Other Clinician: Referring Nilesh Stegall: Treating Marasia Newhall/Extender: Ky Barban in Treatment: 20 Edema Assessment Assessed: Kyra Searles: No] Franne Forts: No] Edema: [Left: N] [Right: o] Calf Left: Right: Point of Measurement: cm From Medial Instep 28 cm cm Ankle Left: Right: Point of Measurement: cm From Medial Instep 18.7 cm cm Vascular Assessment Pulses: Dorsalis Pedis Palpable: [Left:Yes] Electronic Signature(s) Signed: 10/09/2019 6:28:13 PM By: Zandra Abts RN, BSN Entered By: Zandra Abts on 10/06/2019 11:01:53 -------------------------------------------------------------------------------- Multi Wound Chart Details Patient Name: Date of Service: Graceann Congress, [name] 10/06/2019 10:15 A M Medical Record Number: [id number] Patient Account Number: [id number] Date of Birth/Sex: Treating RN: [birth year] (59 y.o. [name] Primary Care Jocilyn Trego: SYSTEM, PCP Other Clinician: Referring Azaleah Usman: Treating Aizik Reh/Extender: Ky Barban in Treatment: 20 Vital Signs Height(in): 73 Pulse(bpm): 83 Weight(lbs): 207 Blood  Pressure(mmHg): 110/72 Body Mass Index(BMI): 27 Temperature(F): 98.4 Respiratory Rate(breaths/min): 16 Photos: [3:No Photos Left, Lateral Foot] [7:No Photos Left T Second oe] [N/A:N/A N/A] Wound Location: [3:Gradually Appeared] [7:Gradually Appeared] [N/A:N/A] Wounding Event: [3:Diabetic Wound/Ulcer of the Lower] [7:Diabetic Wound/Ulcer of the Lower] [N/A:N/A] Primary Etiology: [3:Extremity Glaucoma, Coronary Artery Disease,] [7:Extremity Glaucoma, Coronary Artery Disease,] [N/A:N/A] Comorbid History: [3:Deep Vein Thrombosis, Hypertension, Peripheral Arterial Disease, Type II Diabetes, Dementia, Neuropathy, Seizure Disorder 03/05/2019] [7:Deep Vein Thrombosis, Hypertension, Peripheral Arterial Disease, Type II Diabetes, Dementia,  Neuropathy, Seizure Disorder 09/22/2019] [  N/A:N/A] Date Acquired: [3:20] [7:2] [N/A:N/A] Weeks of Treatment: [3:Healed - Epithelialized] [7:Open] [N/A:N/A] Wound Status: [3:0x0x0] [7:1x0.8x0.1] [N/A:N/A] Measurements L x W x D (cm) [3:0] [7:0.628] [N/A:N/A] A (cm) : rea [3:0] [4:1.324] [N/A:N/A] Volume (cm) : [3:100.00%] [7:-280.60%] [N/A:N/A] % Reduction in A rea: [3:100.00%] [7:-293.70%] [N/A:N/A] % Reduction in Volume: [3:Grade 2] [7:Grade 2] [N/A:N/A] Classification: [3:None Present] [7:Small] [N/A:N/A] Exudate A mount: [3:N/A] [7:Serosanguineous] [N/A:N/A] Exudate Type: [3:N/A] [7:red, brown] [N/A:N/A] Exudate Color: [3:Distinct, outline attached] [7:Flat and Intact] [N/A:N/A] Wound Margin: [3:None Present (0%)] [7:None Present (0%)] [N/A:N/A] Granulation A mount: [3:None Present (0%)] [7:Large (67-100%)] [N/A:N/A] Necrotic A mount: [3:N/A] [7:Eschar] [N/A:N/A] Necrotic Tissue: [3:Fascia: No] [7:Fat Layer (Subcutaneous Tissue)] [N/A:N/A] Exposed Structures: [3:Fat Layer (Subcutaneous Tissue) Exposed: No Tendon: No Muscle: No Joint: No Bone: No Large (67-100%)] [7:Exposed: Yes Fascia: No Tendon: No Muscle: No Joint: No Bone: No Small (1-33%)]  [N/A:N/A] Epithelialization: [3:N/A] [7:Debridement - Excisional] [N/A:N/A] Debridement: Pre-procedure Verification/Time Out N/A [7:11:35] [N/A:N/A] Taken: [3:N/A] [7:Other] [N/A:N/A] Pain Control: [3:N/A] [7:Subcutaneous] [N/A:N/A] Tissue Debrided: [3:N/A] [7:Skin/Subcutaneous Tissue] [N/A:N/A] Level: [3:N/A] [7:0.8] [N/A:N/A] Debridement A (sq cm): [3:rea N/A] [7:Curette] [N/A:N/A] Instrument: [3:N/A] [7:Minimum] [N/A:N/A] Bleeding: [3:N/A] [7:Pressure] [N/A:N/A] Hemostasis A chieved: [3:N/A] [7:0] [N/A:N/A] Procedural Pain: [3:N/A] [7:0] [N/A:N/A] Post Procedural Pain: [3:N/A] [7:Procedure was tolerated well] [N/A:N/A] Debridement Treatment Response: [3:N/A] [7:1x0.8x0.1] [N/A:N/A] Post Debridement Measurements L x W x D (cm) [3:N/A] [7:0.063] [N/A:N/A] Post Debridement Volume: (cm) [3:N/A] [7:Debridement] [N/A:N/A] Treatment Notes Electronic Signature(s) Signed: 10/06/2019 4:23:17 PM By: Kela Millin Signed: 10/09/2019 5:22:40 PM By: Linton Ham MD Entered By: Linton Ham on 10/06/2019 11:55:50 -------------------------------------------------------------------------------- Multi-Disciplinary Care Plan Details Patient Name: Date of Service: [name], [name] 10/06/2019 10:15 A M Medical Record Number: [id number] Patient Account Number: [id number] Date of Birth/Sex: Treating RN: April 23, 1961 (59 y.o. [name] Primary Care Leily Capek: SYSTEM, PCP Other Clinician: Referring Filimon Miranda: Treating Joscelynn Brutus/Extender: Thea Silversmith in Treatment: 20 Active Inactive Nutrition Nursing Diagnoses: Imbalanced nutrition Goals: Patient/caregiver will maintain therapeutic glucose control Date Initiated: 05/19/2019 Target Resolution Date: 11/24/2019 Goal Status: Active Interventions: Provide education on elevated blood sugars and impact on wound healing Notes: Wound/Skin Impairment Nursing Diagnoses: Impaired tissue integrity Goals: Ulcer/skin  breakdown will have a volume reduction of 50% by week 8 Date Initiated: 05/19/2019 Target Resolution Date: 11/24/2019 Goal Status: Active Interventions: Provide education on ulcer and skin care Notes: Electronic Signature(s) Signed: 10/06/2019 4:23:17 PM By: Kela Millin Entered By: Kela Millin on 10/06/2019 11:37:00 -------------------------------------------------------------------------------- Pain Assessment Details Patient Name: Date of Service: [name], [name] 10/06/2019 10:15 A M Medical Record Number: [id number] Patient Account Number: [id number] Date of Birth/Sex: Treating RN: [birth year] (59 y.o. [name] Primary Care Vayda Dungee: SYSTEM, PCP Other Clinician: Referring Holley Kocurek: Treating Marny Smethers/Extender: Thea Silversmith in Treatment: 20 Active Problems Location of Pain Severity and Description of Pain Patient Has Paino No Site Locations Pain Management and Medication Current Pain Management: Electronic Signature(s) Signed: 10/09/2019 6:28:13 PM By: Levan Hurst RN, BSN Entered By: Levan Hurst on 10/06/2019 11:01:48 -------------------------------------------------------------------------------- Patient/Caregiver Education Details Patient Name: Date of Service: [name] EN D. 6/4/2021andnbsp10:15 A M Medical Record Number: [id number] Patient Account Number: [id number] Date of Birth/Gender: Treating RN: June 20, 1960 (59 y.o. [name] Primary Care Physician: SYSTEM, PCP Other Clinician: Referring Physician: Treating Physician/Extender: Thea Silversmith in Treatment: 20 Education Assessment Education Provided To: Patient Education Topics Provided Elevated Blood Sugar/ Impact on Healing: Handouts: Elevated Blood Sugars: How Do They Affect Wound Healing Methods: Explain/Verbal  Responses: State content correctly Wound/Skin Impairment: Handouts: Caring for Your Ulcer Methods:  Explain/Verbal Responses: State content correctly Electronic Signature(s) Signed: 10/06/2019 4:23:17 PM By: Cherylin Mylar Entered By: Cherylin Mylar on 10/06/2019 11:46:23 -------------------------------------------------------------------------------- Wound Assessment Details Patient Name: Date of Service: Graceann Congress, [name] 10/06/2019 10:15 A M Medical Record Number: [id number] Patient Account Number: [id number] Date of Birth/Sex: Treating RN: 1961/03/27 (59 y.o. [name] Primary Care Jossette Zirbel: SYSTEM, PCP Other Clinician: Referring Jennifr Gaeta: Treating Cj Edgell/Extender: Ky Barban in Treatment: 20 Wound Status Wound Number: 3 Primary Diabetic Wound/Ulcer of the Lower Extremity Etiology: Wound Location: Left, Lateral Foot Wound Healed - Epithelialized Wounding Event: Gradually Appeared Status: Date Acquired: 03/05/2019 Comorbid Glaucoma, Coronary Artery Disease, Deep Vein Thrombosis, Weeks Of Treatment: 20 History: Hypertension, Peripheral Arterial Disease, Type II Diabetes, Clustered Wound: No Dementia, Neuropathy, Seizure Disorder Photos Photo Uploaded By: Benjaman Kindler on 10/09/2019 14:28:31 Wound Measurements Length: (cm) Width: (cm) Depth: (cm) Area: (cm) Volume: (cm) 0 % Reduction in Area: 100% 0 % Reduction in Volume: 100% 0 Epithelialization: Large (67-100%) 0 Tunneling: No 0 Undermining: No Wound Description Classification: Grade 2 Wound Margin: Distinct, outline attached Exudate Amount: None Present Foul Odor After Cleansing: No Slough/Fibrino No Wound Bed Granulation Amount: None Present (0%) Exposed Structure Necrotic Amount: None Present (0%) Fascia Exposed: No Fat Layer (Subcutaneous Tissue) Exposed: No Tendon Exposed: No Muscle Exposed: No Joint Exposed: No Bone Exposed: No Electronic Signature(s) Signed: 10/06/2019 4:23:17 PM By: Cherylin Mylar Entered By: Cherylin Mylar on 10/06/2019  11:33:55 -------------------------------------------------------------------------------- Wound Assessment Details Patient Name: Date of Service: Graceann Congress, [name] 10/06/2019 10:15 A M Medical Record Number: [id number] Patient Account Number: [id number] Date of Birth/Sex: Treating RN: [birth year] (59 y.o. Elizebeth Koller Primary Care Jessieca Rhem: SYSTEM, PCP Other Clinician: Referring Nikolas Casher: Treating Ahmaad Neidhardt/Extender: Ky Barban in Treatment: 20 Wound Status Wound Number: 7 Primary Diabetic Wound/Ulcer of the Lower Extremity Etiology: Wound Location: Left T Second oe Wound Open Wounding Event: Gradually Appeared Status: Date Acquired: 09/22/2019 Comorbid Glaucoma, Coronary Artery Disease, Deep Vein Thrombosis, Weeks Of Treatment: 2 History: Hypertension, Peripheral Arterial Disease, Type II Diabetes, Clustered Wound: No Dementia, Neuropathy, Seizure Disorder Photos Photo Uploaded By: Benjaman Kindler on 10/09/2019 14:28:32 Wound Measurements Length: (cm) 1 Width: (cm) 0.8 Depth: (cm) 0.1 Area: (cm) 0.628 Volume: (cm) 0.063 % Reduction in Area: -280.6% % Reduction in Volume: -293.7% Epithelialization: Small (1-33%) Tunneling: No Undermining: No Wound Description Classification: Grade 2 Wound Margin: Flat and Intact Exudate Amount: Small Exudate Type: Serosanguineous Exudate Color: red, brown Foul Odor After Cleansing: No Slough/Fibrino No Wound Bed Granulation Amount: None Present (0%) Exposed Structure Necrotic Amount: Large (67-100%) Fascia Exposed: No Necrotic Quality: Eschar Fat Layer (Subcutaneous Tissue) Exposed: Yes Tendon Exposed: No Muscle Exposed: No Joint Exposed: No Bone Exposed: No Treatment Notes Wound #7 (Left Toe Second) 1. Cleanse With Wound Cleanser 3. Primary Dressing Applied Santyl 4. Secondary Dressing Foam Electronic Signature(s) Signed: 10/09/2019 6:28:13 PM By: Zandra Abts RN, BSN Entered By: Zandra Abts on 10/06/2019 11:02:47 -------------------------------------------------------------------------------- Vitals Details Patient Name: Date of Service: Graceann Congress, [name] 10/06/2019 10:15 A M Medical Record Number: [id number] Patient Account Number: [id number] Date of Birth/Sex: Treating RN: January 16, 1961 (59 y.o. Elizebeth Koller Primary Care Verneal Wiers: SYSTEM, PCP Other Clinician: Referring Asjah Rauda: Treating Amiree No/Extender: Ky Barban in Treatment: 20 Vital Signs Time Taken: 10:52 Temperature (F): 98.4 Height (in): 73 Pulse (bpm): 83 Weight (lbs): 207 Respiratory Rate (breaths/min): 16 Body Mass  Index (BMI): 27.3 Blood Pressure (mmHg): 110/72 Reference Range: 80 - 120 mg / dl Electronic Signature(s) Signed: 10/09/2019 6:28:13 PM By: Zandra Abts RN, BSN Entered By: Zandra Abts on 10/06/2019 11:01:41

## 2019-10-09 NOTE — Progress Notes (Signed)
[name] ([id number]) Visit Report for 10/06/2019 Debridement Details Patient Name: Date of Service: [name] [place] D. 10/06/2019 10:15 A M Medical Record Number: [id number] Patient Account Number: [id number] Date of Birth/Sex: Treating RN: 04/09/1961 (59 y.o. [name] Primary Care Provider: SYSTEM, PCP Other Clinician: Referring Provider: Treating Provider/Extender: Thea Silversmith in Treatment: 20 Debridement Performed for Assessment: Wound #7 Left T Second oe Performed By: Physician Ricard Dillon., MD Debridement Type: Debridement Severity of Tissue Pre Debridement: Fat layer exposed Level of Consciousness (Pre-procedure): Awake and Alert Pre-procedure Verification/Time Out Yes - 11:35 Taken: Start Time: 11:35 Pain Control: Other : benzocaine, 20% T Area Debrided (L x W): otal 1 (cm) x 0.8 (cm) = 0.8 (cm) Tissue and other material debrided: Viable, Non-Viable, Subcutaneous Level: Skin/Subcutaneous Tissue Debridement Description: Excisional Instrument: Curette Bleeding: Minimum Hemostasis Achieved: Pressure End Time: 11:36 Procedural Pain: 0 Post Procedural Pain: 0 Response to Treatment: Procedure was tolerated well Level of Consciousness (Post- Awake and Alert procedure): Post Debridement Measurements of Total Wound Length: (cm) 1 Width: (cm) 0.8 Depth: (cm) 0.1 Volume: (cm) 0.063 Character of Wound/Ulcer Post Debridement: Improved Severity of Tissue Post Debridement: Fat layer exposed Post Procedure Diagnosis Same as Pre-procedure Electronic Signature(s) Signed: 10/06/2019 4:23:17 PM By: Kela Millin Signed: 10/09/2019 5:22:40 PM By: Linton Ham MD Entered By: Linton Ham on 10/06/2019 11:56:03 -------------------------------------------------------------------------------- HPI Details Patient Name: Date of Service: Frank Moll, [name] 10/06/2019 10:15 A M Medical Record Number: [id number] Patient Account Number:  [id number] Date of Birth/Sex: Treating RN: Oct 25, 1960 (59 y.o. [name] Primary Care Provider: SYSTEM, PCP Other Clinician: Referring Provider: Treating Provider/Extender: Thea Silversmith in Treatment: 24 History of Present Illness HPI Description: ADMISSION 05/19/2019 [name] is now a 59 year old man with type 2 diabetes. He was actually in this clinic in 2006 in 2008. I do not have these records. More recently I actually looked after him in primary care with [name] up until 2016. Notable for the fact that I treated him for osteomyelitis in the right foot for an MRSA infection in the facility in 2014 or thereabouts. Gave him a prolonged course of vancomycin in the facility. My notes at the time do not list him has having significant PAD. He has severe diabetic neuropathy. The patient apparently has had wounds on his feet since November. This includes the right second toe and the left foot at the mid part of the fifth metatarsal. They are using Santyl on the left foot I am not clear what they are using to the right toe. I do not know if there is been any imaging studies done. It does not appear that his vascular status is been rechecked. 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 The patient is on a combination of Augmentin and Cipro both for 2 weeks. The patient had his arterial studies done at [facility] yesterday I will need to research these results. We are using silver alginate on the left lateral foot and on the right second toe 2/12; he  should be completing his antibiotics Augmentin and Cipro. Unfortunately had his arterial studies done at radiology at [facility]  they did not do right ABI or left ABIs or TBI's for that matter even though all of these were ordered. Nevertheless on the right there was triphasic waveforms of the common femoral artery, profunda femoris SFA, popliteal artery. Triphasic posterior tibial and anterior tibial as well as peroneal arteries. On the left there was a definite drop off of the distal SFA and popliteal were monophasic. Comment made that they may need to correlate with an ABI on the left even though this was ordered. We will call up and see what the problem was there. He has wounds on the left lateral fifth metatarsal head and the right second toe. Paradoxically both of the wounds look somewhat better 2/26; patient had his arterial studies yesterday at [facility]. These showed an ABI of 0.97 at the dorsalis pedis on the right with a TBI of 0.54. Biphasic waveforms. On the left ABI at 0.94 at the dorsalis pedis TBI at 0.43. The area on the left lateral foot appears to be better. Right second toe was closed. At discharge he was noted to have wounds on the left medial fifth and the left lateral fourth and the webspace. Both toes were tender and erythematous 3/12. The area on the left lateral foot appears to be contracting. He has areas on the left medial third and the lateral fourth. These may be friction areas from the toes pressing against each other. X-rays of the left foot did not comment osteomyelitis done at the facility 3/26 2-week follow-up. The patient arrives today with the area that we have been following on the left lateral foot superficial and looking better. However last time he was here he had new open areas on the left medial third and lateral fourth toes. The lateral fourth toe was done well however the medial third toe has exposed bone. The toe itself is erythemtous and warm 4/30; the  patient's left third toe and fourth toe are both closed. The area on the left lateral foot still has some probing depth. Weeping tissue no overt infection does not probe to bone. We have been using silver alginate on all these areas The patient has what looks to be a deep tissue injury on the medial part of the left first metatarsal head there is no open area here however 5/21; the patient's problematic area on the left lateral foot is still open but with some improvement. He has new open areas on the lateral part of the left second toe and the DTI injury over the metatarsal head is open today with a very superficial wound 6/4; the patient's original area on the left lateral foot appears to have healed. He does have what appears to be a denuded old blister just distal to this on the base of the left fifth metatarsal. There is nothing open here. There is nothing open on the left first metatarsal head lateral aspect if we are still having problems and concerns about 2 weeks ago. The only open area is on the lateral part of the left second toe Electronic Signature(s) Signed: 10/09/2019 5:22:40 PM By: Linton Ham MD Entered By: Linton Ham on 10/06/2019 11:57:29 -------------------------------------------------------------------------------- Physical Exam Details Patient Name: Date of Service: Frank Moll, [name] 10/06/2019 10:15 A M Medical Record Number: [id number] Patient Account Number: [id number] Date of Birth/Sex: Treating RN: [birth year] (59 y.o. [name] Primary Care Provider: SYSTEM, PCP Other Clinician: Referring Provider: Treating Provider/Extender: Thea Silversmith in Treatment: 20 Constitutional Sitting or standing Blood Pressure  is within target range for patient.. Pulse regular and within target range for patient.Marland Kitchen Respirations regular, non-labored and within target range.. Temperature is normal and within the target range for the patient.Marland Kitchen Appears  in no distress. Notes Wound exam; left lateral foot this appears to be completely closed. This was the site of his underlying osteomyelitis. It looks as though he had an denuded blister over the base of the fifth metatarsal on the left foot this does not have an open area. The problematic area is the lateral aspect of the left second toe after careful vascular evaluation I debrided this with a #3 curette he does not have a viable base to this this does not go to bone. The area on the left first metatarsal head has no open area under some eschar Electronic Signature(s) Signed: 10/09/2019 5:22:40 PM By: Linton Ham MD Entered By: Linton Ham on 10/06/2019 11:58:44 -------------------------------------------------------------------------------- Physician Orders Details Patient Name: Date of Service: Frank Moll, [name] 10/06/2019 10:15 A M Medical Record Number: [id number] Patient Account Number: [id number] Date of Birth/Sex: Treating RN: November 03, 1960 (59 y.o. [name] Primary Care Provider: SYSTEM, PCP Other Clinician: Referring Provider: Treating Provider/Extender: Thea Silversmith in Treatment: 60 Verbal / Phone Orders: No Diagnosis Coding ICD-10 Coding Code Description E11.621 Type 2 diabetes mellitus with foot ulcer L97.524 Non-pressure chronic ulcer of other part of left foot with necrosis of bone M86.472 Chronic osteomyelitis with draining sinus, left ankle and foot E11.51 Type 2 diabetes mellitus with diabetic peripheral angiopathy without gangrene L97.521 Non-pressure chronic ulcer of other part of left foot limited to breakdown of skin Follow-up Appointments ppointment in 2 weeks. - Friday Return A Skin Barriers/Peri-Wound Care Skin Prep Primary Wound Dressing Wound #7 Left T Second oe Santyl Ointment Foam - use foam between all toes to cushion and cushion 1st met head and lateral foot. Off-Loading Multipodus Splint to: - both  feet Electronic Signature(s) Signed: 10/06/2019 4:23:17 PM By: Kela Millin Signed: 10/09/2019 5:22:40 PM By: Linton Ham MD Entered By: Kela Millin on 10/06/2019 11:39:48 -------------------------------------------------------------------------------- Problem List Details Patient Name: Date of Service: Frank Moll, [name] 10/06/2019 10:15 A M Medical Record Number: [id number] Patient Account Number: [id number] Date of Birth/Sex: Treating RN: 06-13-1960 (60 y.o. [name] Primary Care Provider: SYSTEM, PCP Other Clinician: Referring Provider: Treating Provider/Extender: Thea Silversmith in Treatment: 7 Active Problems ICD-10 Encounter Encounter Code Description Active Date MDM Diagnosis E11.621 Type 2 diabetes mellitus with foot ulcer 05/19/2019 No Yes L97.524 Non-pressure chronic ulcer of other part of left foot with necrosis of bone 05/19/2019 No Yes M86.472 Chronic osteomyelitis with draining sinus, left ankle and foot 08/11/2019 No Yes E11.51 Type 2 diabetes mellitus with diabetic peripheral angiopathy without gangrene 05/19/2019 No Yes L97.521 Non-pressure chronic ulcer of other part of left foot limited to breakdown of 06/30/2019 No Yes skin Inactive Problems ICD-10 Code Description Active Date Inactive Date L97.514 Non-pressure chronic ulcer of other part of right foot with necrosis of bone 05/19/2019 05/19/2019 Resolved Problems Electronic Signature(s) Signed: 10/09/2019 5:22:40 PM By: Linton Ham MD Entered By: Linton Ham on 10/06/2019 11:55:42 -------------------------------------------------------------------------------- Progress Note Details Patient Name: Date of Service: Frank Moll, [name] 10/06/2019 10:15 A M Medical Record Number: [id number] Patient Account Number: [id number] Date of Birth/Sex: Treating RN: 07/15/1960 (59 y.o. [name] Primary Care Provider: SYSTEM, PCP Other Clinician: Referring  Provider: Treating Provider/Extender: Thea Silversmith in Treatment: 20 Subjective History of Present Illness (  HPI) ADMISSION 05/19/2019 [name] is now a 59 year old man with type 2 diabetes. He was actually in this clinic in 2006 in 2008. I do not have these records. More recently I actually looked after him in primary care with [facility] up until 2016. Notable for the fact that I treated him for osteomyelitis in the right foot for an MRSA infection in the facility in 2014 or thereabouts. Gave him a prolonged course of vancomycin in the facility. My notes at the time do not list him has having significant PAD. He has severe diabetic neuropathy. The patient apparently has had wounds on his feet since November. This includes the right second toe and the left foot at the mid part of the fifth metatarsal. They are using Santyl on the left foot I am not clear what they are using to the right toe. I do not know if there is been any imaging studies done. It does not appear that his vascular status is been rechecked. 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 The patient is on a combination of Augmentin and Cipro both for 2 weeks. The patient had his arterial studies done at [facility] yesterday I will need to research these results. We are using silver alginate on the left lateral foot and on the right second toe 2/12; he should be completing his antibiotics Augmentin and Cipro. Unfortunately had his arterial studies done at radiology at [facility] they did not do right  ABI or left ABIs or TBI's for that matter even though all of these were ordered. Nevertheless on the right there was triphasic waveforms of the common femoral artery, profunda femoris SFA, popliteal artery. Triphasic posterior tibial and anterior tibial as well as peroneal arteries. On the left there was a definite drop off of the distal SFA and popliteal were monophasic. Comment made that they may need to correlate with an ABI on the left even though this was ordered. We will call up and see what the problem was there. He has wounds on the left lateral fifth metatarsal head and the right second toe. Paradoxically both of the wounds look somewhat better 2/26; patient had his arterial studies yesterday at [facility]. These showed an ABI of 0.97 at the dorsalis pedis on the right with a TBI of 0.54. Biphasic waveforms. On the left ABI at 0.94 at the dorsalis pedis TBI at 0.43. The area on the left lateral foot appears to be better. Right second toe was closed. At discharge he was noted to have wounds on the left medial fifth and the left lateral fourth and the webspace. Both toes were tender and erythematous 3/12. The area on the left lateral foot appears to be contracting. He has areas on the left medial third and the lateral fourth. These may be friction areas from the toes pressing against each other. X-rays of the left foot did not comment osteomyelitis done at the facility 3/26 2-week follow-up. The patient arrives today with the area that we have been following on the left lateral foot superficial and looking better. However last time he was here he had new open areas on the left medial third and lateral  fourth toes. The lateral fourth toe was done well however the medial third toe has exposed bone. The toe itself is erythemtous and warm 4/30; the patient's left third toe and fourth toe are both closed. The area on the left lateral foot still has some probing depth. Weeping tissue no overt  infection does not probe to bone. We have been using silver alginate on all these areas The patient has what looks to be a deep tissue injury on the medial part of the left first metatarsal head there is no open area here however 5/21; the patient's problematic area on the left lateral foot is still open but with some improvement. He has new open areas on the lateral part of the left second toe and the DTI injury over the metatarsal head is open today with a very superficial wound 6/4; the patient's original area on the left lateral foot appears to have healed. He does have what appears to be a denuded old blister just distal to this on the base of the left fifth metatarsal. There is nothing open here. There is nothing open on the left first metatarsal head lateral aspect if we are still having problems and concerns about 2 weeks ago. The only open area is on the lateral part of the left second toe Objective Constitutional Sitting or standing Blood Pressure is within target range for patient.. Pulse regular and within target range for patient.Marland Kitchen Respirations regular, non-labored and within target range.. Temperature is normal and within the target range for the patient.Marland Kitchen Appears in no distress. Vitals Time Taken: 10:52 AM, Height: 73 in, Weight: 207 lbs, BMI: 27.3, Temperature: 98.4 F, Pulse: 83 bpm, Respiratory Rate: 16 breaths/min, Blood Pressure: 110/72 mmHg. General Notes: Wound exam; left lateral foot this appears to be completely closed. This was the site of his underlying osteomyelitis. ooIt looks as though he had an denuded blister over the base of the fifth metatarsal on the left foot this does not have an open area. ooThe problematic area is the lateral aspect of the left second toe after careful vascular evaluation I debrided this with a #3 curette he does not have a viable base to this this does not go to bone. ooThe area on the left first metatarsal head has no open area under some  eschar Integumentary (Hair, Skin) Wound #3 status is Healed - Epithelialized. Original cause of wound was Gradually Appeared. The wound is located on the Left,Lateral Foot. The wound measures 0cm length x 0cm width x 0cm depth; 0cm^2 area and 0cm^3 volume. There is no tunneling or undermining noted. There is a none present amount of drainage noted. The wound margin is distinct with the outline attached to the wound base. There is no granulation within the wound bed. There is no necrotic tissue within the wound bed. Wound #7 status is Open. Original cause of wound was Gradually Appeared. The wound is located on the Left T Second. The wound measures 1cm length x oe 0.8cm width x 0.1cm depth; 0.628cm^2 area and 0.063cm^3 volume. There is Fat Layer (Subcutaneous Tissue) Exposed exposed. There is no tunneling or undermining noted. There is a small amount of serosanguineous drainage noted. The wound margin is flat and intact. There is no granulation within the wound bed. There is a large (67-100%) amount of necrotic tissue within the wound bed including Eschar. Assessment Active Problems ICD-10 Type 2 diabetes mellitus with foot ulcer Non-pressure chronic ulcer of other part of left foot with necrosis of bone Chronic  osteomyelitis with draining sinus, left ankle and foot Type 2 diabetes mellitus with diabetic peripheral angiopathy without gangrene Non-pressure chronic ulcer of other part of left foot limited to breakdown of skin Procedures Wound #7 Pre-procedure diagnosis of Wound #7 is a Diabetic Wound/Ulcer of the Lower Extremity located on the Left T Second .Severity of Tissue Pre Debridement oe is: Fat layer exposed. There was a Excisional Skin/Subcutaneous Tissue Debridement with a total area of 0.8 sq cm performed by Ricard Dillon., MD. With the following instrument(s): Curette to remove Viable and Non-Viable tissue/material. Material removed includes Subcutaneous Tissue after achieving  pain control using Other (benzocaine, 20%). No specimens were taken. A time out was conducted at 11:35, prior to the start of the procedure. A Minimum amount of bleeding was controlled with Pressure. The procedure was tolerated well with a pain level of 0 throughout and a pain level of 0 following the procedure. Post Debridement Measurements: 1cm length x 0.8cm width x 0.1cm depth; 0.063cm^3 volume. Character of Wound/Ulcer Post Debridement is improved. Severity of Tissue Post Debridement is: Fat layer exposed. Post procedure Diagnosis Wound #7: Same as Pre-Procedure Plan Follow-up Appointments: Return Appointment in 2 weeks. - Friday Skin Barriers/Peri-Wound Care: Skin Prep Primary Wound Dressing: Wound #7 Left T Second: oe Santyl Ointment Foam - use foam between all toes to cushion and cushion 1st met head and lateral foot. Off-Loading: Multipodus Splint to: - both feet 1. Left second toe debrided with a #3 curette we are going to change the dressing here to Santyl. 2. There is nothing really open anywhere else on the foot which is really gratifying. This includes the problematic area on the left lateral foot under which he originally had osteomyelitis with exposed bone 3. The area was all on the lateral part of the left foot probably need to be offloaded with foam for protection also the left medial first metatarsal head Electronic Signature(s) Signed: 10/09/2019 5:22:40 PM By: Linton Ham MD Entered By: Linton Ham on 10/06/2019 11:59:57 -------------------------------------------------------------------------------- SuperBill Details Patient Name: Date of Service: Frank Moll, [name] 10/06/2019 Medical Record Number: [id number] Patient Account Number: [id number] Date of Birth/Sex: Treating RN: [birth year] (59 y.o. [name] Primary Care Provider: SYSTEM, PCP Other Clinician: Referring Provider: Treating Provider/Extender: Thea Silversmith in  Treatment: 20 Diagnosis Coding ICD-10 Codes Code Description E11.621 Type 2 diabetes mellitus with foot ulcer L97.524 Non-pressure chronic ulcer of other part of left foot with necrosis of bone M86.472 Chronic osteomyelitis with draining sinus, left ankle and foot E11.51 Type 2 diabetes mellitus with diabetic peripheral angiopathy without gangrene L97.521 Non-pressure chronic ulcer of other part of left foot limited to breakdown of skin Facility Procedures CPT4 Code: [id number] Description: 10071 - DEB SUBQ TISSUE 20 SQ CM/< ICD-10 Diagnosis Description L97.521 Non-pressure chronic ulcer of other part of left foot limited to breakdown of Modifier: skin Quantity: 1 Physician Procedures Electronic Signature(s) Signed: 10/09/2019 5:22:40 PM By: Linton Ham MD Entered By: Linton Ham on 10/06/2019 12:00:09

## 2019-10-20 ENCOUNTER — Encounter (HOSPITAL_BASED_OUTPATIENT_CLINIC_OR_DEPARTMENT_OTHER): Payer: Medicare Other | Admitting: Internal Medicine

## 2019-10-25 ENCOUNTER — Other Ambulatory Visit (HOSPITAL_COMMUNITY)
Admission: RE | Admit: 2019-10-25 | Discharge: 2019-10-25 | Disposition: A | Discharge status: Home / Self Care | Payer: Medicare Other | Source: Other Acute Inpatient Hospital | Attending: Physician Assistant | Admitting: Physician Assistant

## 2019-10-25 ENCOUNTER — Other Ambulatory Visit: Payer: Self-pay

## 2019-10-25 ENCOUNTER — Encounter (HOSPITAL_BASED_OUTPATIENT_CLINIC_OR_DEPARTMENT_OTHER): Payer: Medicare Other | Admitting: Physician Assistant

## 2019-10-25 DIAGNOSIS — S91105A Unspecified open wound of left lesser toe(s) without damage to nail, initial encounter: Secondary | ICD-10-CM | POA: Insufficient documentation

## 2019-10-25 DIAGNOSIS — L089 Local infection of the skin and subcutaneous tissue, unspecified: Secondary | ICD-10-CM | POA: Diagnosis not present

## 2019-10-25 DIAGNOSIS — X58XXXA Exposure to other specified factors, initial encounter: Secondary | ICD-10-CM | POA: Insufficient documentation

## 2019-10-25 DIAGNOSIS — E11621 Type 2 diabetes mellitus with foot ulcer: Secondary | ICD-10-CM | POA: Diagnosis not present

## 2019-10-25 NOTE — Progress Notes (Addendum)
Erin FullingJOYCE, Legrande D. ([id number]) Visit Report for 10/25/2019 Chief Complaint Document Details Patient Name: Date of Service: Frank BeanJO YCE, STEV [place] D. 10/25/2019 2:30 PM Medical Record Number: [id number] Patient Account Number: [id number] Date of Birth/Sex: Treating RN: 02/15/1961 (59 y.o. [name]) Boehlein, Linda Primary Care Provider: SYSTEM, PCP Other Clinician: Referring Provider: Treating Provider/Extender: [place] BingStone III, Taquana Bartley Moore, Donald Weeks in Treatment: 22 Information Obtained from: Patient Chief Complaint 05/19/2019; patient is here for review of wounds on his right dorsal second toe and his left lateral foot Electronic Signature(s) Signed: 10/25/2019 2:22:58 PM By: Lenda KelpStone III, Jonelle Bann PA-C Entered By: Lenda KelpStone III, Yasmyn Bellisario on 10/25/2019 14:22:57 -------------------------------------------------------------------------------- Debridement Details Patient Name: Date of Service: Frank CongressJO YCE, Frank [place] EN D. 10/25/2019 2:30 PM Medical Record Number: [id number] Patient Account Number: [id number] Date of Birth/Sex: Treating RN: 02/15/1961 (59 y.o. [name]) Boehlein, Linda Primary Care Provider: SYSTEM, PCP Other Clinician: Referring Provider: Treating Provider/Extender: [place] BingStone III, Judith Demps Moore, Donald Weeks in Treatment: 22 Debridement Performed for Assessment: Wound #3R Left,Lateral Foot Performed By: [place] Epps, Carrie, RN Debridement Type: Chemical/Enzymatic/Mechanical Agent Used: Santyl Severity of Tissue Pre Debridement: Fat layer exposed Level of Consciousness (Pre-procedure): Awake and Alert Pre-procedure Verification/Time Out Yes - 16:00 Taken: Bleeding: None Response to Treatment: Procedure was tolerated well Level of Consciousness (Post- Awake and Alert procedure): Post Debridement Measurements of Total Wound Length: (cm) 1.5 Width: (cm) 1.2 Depth: (cm) 0.3 Volume: (cm) 0.424 Character of Wound/Ulcer Post Debridement: Requires Further Debridement Severity of Tissue Post Debridement: Fat layer  exposed Post Procedure Diagnosis Same as Pre-procedure Electronic Signature(s) Signed: 10/25/2019 4:25:30 PM By: Zenaida DeedBoehlein, Linda RN, BSN Signed: 10/27/2019 3:47:20 PM By: Lenda KelpStone III, Laqueena Hinchey PA-C Entered By: Zenaida DeedBoehlein, Linda on 10/25/2019 16:02:53 -------------------------------------------------------------------------------- HPI Details Patient Name: Date of Service: Frank CongressJO YCE, Frank SavoySTEV EN D. 10/25/2019 2:30 PM Medical Record Number: [id number] Patient Account Number: [id number] Date of Birth/Sex: Treating RN: 02/15/1961 (59 y.o. [name]) Boehlein, Linda Primary Care Provider: SYSTEM, PCP Other Clinician: Referring Provider: Treating Provider/Extender: [place] BingStone III, Keontre Defino Moore, Donald Weeks in Treatment: 22 History of Present Illness HPI Description: ADMISSION 05/19/2019 [name] is now a 59 year old man with type 2 diabetes. He was actually in this clinic in 2006 in 2008. I do not have these records. More recently I actually looked after him in primary care with [facility] up until 2016. Notable for the fact that I treated him for osteomyelitis in the right foot for an MRSA infection in the facility in 2014 or thereabouts. Gave him a prolonged course of vancomycin in the facility. My notes at the time do not list him has having significant PAD. He has severe diabetic neuropathy. The patient apparently has had wounds on his feet since November. This includes the right second toe and the left foot at the mid part of the fifth metatarsal. They are using Santyl on the left foot I am not clear what they are using to the right toe. I do not know if there is been any imaging studies done. It does not appear that his vascular status is been rechecked. 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 The patient is on a combination of Augmentin and Cipro both for 2 weeks. The  patient had his arterial studies done at [facility] yesterday I will need to research these results. We are using silver alginate on the left lateral foot and on the right second toe 2/12; he should be completing his antibiotics Augmentin and Cipro. Unfortunately had his arterial studies done at radiology at [facility] they did not do right ABI or left ABIs or TBI's for that matter even though all of these were ordered. Nevertheless on the right there was triphasic waveforms of the common femoral artery, profunda femoris SFA, popliteal artery. Triphasic posterior tibial and anterior tibial as well as peroneal arteries. On the left there was a definite drop off of the distal SFA and popliteal were monophasic. Comment made that they may need to correlate with an ABI on the left even though this was ordered. We will call up and see what the problem was there. He has wounds on the left lateral fifth metatarsal head and the right second toe. Paradoxically both of the wounds look somewhat better 2/26; patient had his arterial studies yesterday at [facility]. These showed an ABI of 0.97 at the dorsalis pedis on the right with a TBI of 0.54. Biphasic waveforms. On the left ABI at 0.94 at the dorsalis pedis TBI at 0.43. The area on the left lateral foot appears to be better. Right second toe was closed. At discharge he was noted to have wounds on the left medial fifth and the left lateral fourth and the webspace. Both toes were tender and erythematous 3/12. The area on the left lateral foot appears to be contracting. He has areas on the left medial third and the lateral fourth. These may be friction areas from the toes pressing against each  other. X-rays of the left foot did not comment osteomyelitis done at the facility 3/26 2-week follow-up. The patient arrives today with the area that we have been following on the left lateral foot superficial and looking better. However last time he was here he had new open areas on the left medial third and lateral fourth toes. The lateral fourth toe was done well however the medial third toe has exposed bone. The toe itself is erythemtous and warm 4/30; the patient's left third toe and fourth toe are both closed. The area on the left lateral foot still has some probing depth. Weeping tissue no overt infection does not probe to bone. We have been using silver alginate on all these areas The patient has what looks to be a deep tissue injury on the medial part of the left first metatarsal head there is no open area here however 5/21; the patient's problematic area on the left lateral foot is still open but with some improvement. He has new open areas on the lateral part of the left second toe and the DTI injury over the metatarsal head is open today with a very superficial wound 6/4; the patient's original area on the left lateral foot appears to have healed. He does have what appears to be a denuded old blister just distal to this on the base of the left fifth metatarsal. There is nothing open here. There is nothing open on the left first metatarsal head lateral aspect if we are still having problems and concerns about 2 weeks ago. The only open area is on the lateral part of the left second toe 10/25/2019 upon evaluation today patient unfortunately presents with worsening of his second toe ulcer on the left. This actually is opened up, appears more erythematous, and  has bone noted at the joint where there is no longer maintains alignment. When I attempted to look between the toes the distal portion of the toe actually slipped medially along the joint line. Again I think that the lateral connective  tissue has deteriorated at this site. I am concerned about more extensive osteomyelitis at this point. Electronic Signature(s) Signed: 10/25/2019 3:44:45 PM By: Lenda Kelp PA-C Entered By: Lenda Kelp on 10/25/2019 15:44:44 -------------------------------------------------------------------------------- Physical Exam Details Patient Name: Date of Service: [name] EN D. 10/25/2019 2:30 PM Medical Record Number: [id number] Patient Account Number: [id number] Date of Birth/Sex: Treating RN: [birth year] (59 y.o. Frank Schooner Primary Care Provider: SYSTEM, PCP Other Clinician: Referring Provider: Treating Provider/Extender: [place] Bing in Treatment: 22 Constitutional Well-nourished and well-hydrated in no acute distress. Respiratory normal breathing without difficulty. Psychiatric this patient is able to make decisions and demonstrates good insight into disease process. Alert and Oriented x 3. pleasant and cooperative. Notes Upon inspection patient's wound on the plantar aspect of the foot has reopened unfortunately. In regard to the second toe this also appears to be doing much worse. I am very concerned about osteomyelitis at the second toe location as well. There really does not appear to be necrotic bone as of yet but nonetheless the connective tissue has been destroyed on the lateral aspect causing it to slip medially as far as the toe is concerned. I think this indicates a deeper infection at minimum. Electronic Signature(s) Signed: 10/25/2019 3:45:20 PM By: Lenda Kelp PA-C Entered By: Lenda Kelp on 10/25/2019 15:45:19 -------------------------------------------------------------------------------- Physician Orders Details Patient Name: Date of Service: [name], [name] EN D. 10/25/2019 2:30 PM Medical Record Number: [id number] Patient Account Number: [id number] Date of Birth/Sex: Treating RN: 1961-04-19 (59 y.o. Frank Schooner Primary Care Provider: SYSTEM, PCP Other Clinician: Referring Provider: Treating Provider/Extender: [place] Bing in Treatment: 22 Verbal / Phone Orders: No Diagnosis Coding ICD-10 Coding Code Description E11.621 Type 2 diabetes mellitus with foot ulcer L97.524 Non-pressure chronic ulcer of other part of left foot with necrosis of bone M86.472 Chronic osteomyelitis with draining sinus, left ankle and foot E11.51 Type 2 diabetes mellitus with diabetic peripheral angiopathy without gangrene L97.521 Non-pressure chronic ulcer of other part of left foot limited to breakdown of skin Follow-up Appointments ppointment in 2 weeks. - Friday Return A Dressing Change Frequency Wound #3R Left,Lateral Foot Change dressing every day. Wound #7 Left T Second oe Change dressing every day. Primary Wound Dressing Wound #3R Left,Lateral Foot Santyl Ointment Wound #7 Left T Second oe Calcium Alginate with Silver Secondary Dressing Wound #3R Left,Lateral Foot Foam - foam donut Kerlix/Rolled Gauze Dry Gauze Wound #7 Left T Second oe Kerlix/Rolled Gauze Dry Gauze Off-Loading Multipodus Splint to: - both feet Laboratory naerobe culture (MICRO) - left 2nd toe Bacteria identified in Unspecified specimen by A LOINC Code: 635-3 Convenience Name: Anerobic culture Radiology MRI, left foot with/without contrast - diabetic foot ulcer left 2nd to and left lateral foot CPT - (ICD10 L97.524 - Non-pressure chronic ulcer of other part of left foot with necrosis of bone) Patient Medications llergies: No Known Allergies A Notifications Medication Indication Start End 10/25/2019 doxycycline hyclate DOSE 1 - oral 100 mg capsule - 1 capsule oral taken 2 times per day for 14 days. Do not take iron or a multivitamin with this medication Electronic Signature(s) Signed: 10/26/2019 5:20:52 PM By: Zenaida Deed RN, BSN Signed: 10/27/2019 3:47:20 PM By: Larina Bras  III, Maylie Ashton  PA-C Entered By: Zenaida Deed on 10/26/2019 10:14:51 Prescription 10/25/2019 -------------------------------------------------------------------------------- Barrington Ellison PA Patient Name: Provider: March 19, 1961 [id number] Date of Birth: NPI#: Judie Petit [id number] Sex: DEA #: [phone] Phone #: License #: Edyth Gunnels [facility] Patient Address: C[address]O [facility] [address] [place] [address] [place], [place] [address] [place], [place] [address] [phone] Allergies Name Reaction Severity No Known Allergies Provider's Orders MRI, left foot with/without contrast - ICD10: L97.524 - diabetic foot ulcer left 2nd to and left lateral foot CPT Hand Signature: Date(s): Prescription 10/25/2019 Barrington Ellison PA Patient Name: Provider: April 05, 1961 [id number] Date of Birth: NPI#: Judie Petit [id number] Sex: DEA #: [phone] Phone #: License #: Eligha Bridegroom [facility] Patient Address: C[address]O [facility] [address] [place] [address] [place], [place] [address] [place], [place] [address] [phone] Allergies Name Reaction Severity No Known Allergies Medication Medication: Route: Strength: Form: doxycycline hyclate 100 mg capsule oral 100 mg capsule Class: TETRACYCLINE ANTIBIOTICS Dose: Frequency / Time: Indication: 1 1 capsule oral taken 2 times per day for 14 days. Do not take iron or a multivitamin with this medication Number of Refills: Number of Units: 0 Twenty Eight (28) Capsule(s) Generic Substitution: Start Date: End Date: One Time Use: Substitution Permitted 10/25/2019 No Note to Pharmacy: Hand Signature: Date(s): Electronic Signature(s) Signed: 10/26/2019 5:20:52 PM By: Zenaida Deed RN, BSN Signed: 10/27/2019 3:47:20 PM By: Lenda Kelp PA-C Entered By: Zenaida Deed on 10/26/2019  10:14:52 -------------------------------------------------------------------------------- Problem List Details Patient Name: Date of Service: [name], [name] EN D. 10/25/2019 2:30 PM Medical Record Number: [id number] Patient Account Number: [id number] Date of Birth/Sex: Treating RN: June 06, 1960 (59 y.o. Frank Schooner Primary Care Provider: SYSTEM, PCP Other Clinician: Referring Provider: Treating Provider/Extender: [place] Bing in Treatment: 22 Active Problems ICD-10 Encounter Code Description Active Date MDM Diagnosis E11.621 Type 2 diabetes mellitus with foot ulcer 05/19/2019 No Yes L97.524 Non-pressure chronic ulcer of other part of left foot with necrosis of bone 05/19/2019 No Yes M86.472 Chronic osteomyelitis with draining sinus, left ankle and foot 08/11/2019 No Yes E11.51 Type 2 diabetes mellitus with diabetic peripheral angiopathy without gangrene 05/19/2019 No Yes L97.521 Non-pressure chronic ulcer of other part of left foot limited to breakdown of 06/30/2019 No Yes skin Inactive Problems ICD-10 Code Description Active Date Inactive Date L97.514 Non-pressure chronic ulcer of other part of right foot with necrosis of bone 05/19/2019 05/19/2019 Resolved Problems Electronic Signature(s) Signed: 10/25/2019 2:22:52 PM By: Lenda Kelp PA-C Entered By: Lenda Kelp on 10/25/2019 14:22:52 -------------------------------------------------------------------------------- Progress Note Details Patient Name: Date of Service: [name], [name] EN D. 10/25/2019 2:30 PM Medical Record Number: [id number] Patient Account Number: [id number] Date of Birth/Sex: Treating RN: 23-Jun-1960 (59 y.o. Frank Schooner Primary Care Provider: SYSTEM, PCP Other Clinician: Referring Provider: Treating Provider/Extender: [place] Bing in Treatment: 22 Subjective Chief Complaint Information obtained from Patient 05/19/2019; patient is here for review of  wounds on his right dorsal second toe and his left lateral foot History of Present Illness (HPI) ADMISSION 05/19/2019 Mr. Ndiaye is now a 59 year old man with type 2 diabetes. He was actually in this clinic in 2006 in 2008. I do not have these records. More recently I actually looked after him in primary care with [facility] up until 2016. Notable for the fact that I  treated him for osteomyelitis in the right foot for an MRSA infection in the facility in 2014 or thereabouts. Gave him a prolonged course of vancomycin in the facility. My notes at the time do not list him has having significant PAD. He has severe diabetic neuropathy. The patient apparently has had wounds on his feet since November. This includes the right second toe and the left foot at the mid part of the fifth metatarsal. They are using Santyl on the left foot I am not clear what they are using to the right toe. I do not know if there is been any imaging studies done. It does not appear that his vascular status is been rechecked. 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 The patient is on a combination of Augmentin and Cipro both for 2 weeks. The patient had his arterial studies done at [facility] yesterday I will need to research these results. We are using silver alginate on the left lateral foot and on the right second toe 2/12; he should be completing his antibiotics Augmentin and Cipro. Unfortunately had his arterial studies done at radiology at [facility] they did not do right ABI or left ABIs or TBI's for that matter  even though all of these were ordered. Nevertheless on the right there was triphasic waveforms of the common femoral artery, profunda femoris SFA, popliteal artery. Triphasic posterior tibial and anterior tibial as well as peroneal arteries. On the left there was a definite drop off of the distal SFA and popliteal were monophasic. Comment made that they may need to correlate with an ABI on the left even though this was ordered. We will call up and see what the problem was there. He has wounds on the left lateral fifth metatarsal head and the right second toe. Paradoxically both of the wounds look somewhat better 2/26; patient had his arterial studies yesterday at [facility]. These showed an ABI of 0.97 at the dorsalis pedis on the right with a TBI of 0.54. Biphasic waveforms. On the left ABI at 0.94 at the dorsalis pedis TBI at 0.43. The area on the left lateral foot appears to be better. Right second toe was closed. At discharge he was noted to have wounds on the left medial fifth and the left lateral fourth and the webspace. Both toes were tender and erythematous 3/12. The area on the left lateral foot appears to be contracting. He has areas on the left medial third and the lateral fourth. These may be friction areas from the toes pressing against each other. X-rays of the left foot did not comment osteomyelitis done at the facility 3/26 2-week follow-up. The patient arrives today with the area that we have been following on the left lateral foot superficial and looking better. However last time he was here he had new open areas on the left medial third and lateral fourth toes. The lateral fourth toe was done well however the medial third toe has exposed bone. The toe itself is erythemtous and warm 4/30; the patient's left third toe and fourth toe are both closed. The area on the left lateral foot still has some probing depth. Weeping tissue no overt infection  does not probe to bone. We have been  using silver alginate on all these areas The patient has what looks to be a deep tissue injury on the medial part of the left first metatarsal head there is no open area here however 5/21; the patient's problematic area on the left lateral foot is still open but with some improvement. He has new open areas on the lateral part of the left second toe and the DTI injury over the metatarsal head is open today with a very superficial wound 6/4; the patient's original area on the left lateral foot appears to have healed. He does have what appears to be a denuded old blister just distal to this on the base of the left fifth metatarsal. There is nothing open here. There is nothing open on the left first metatarsal head lateral aspect if we are still having problems and concerns about 2 weeks ago. The only open area is on the lateral part of the left second toe 10/25/2019 upon evaluation today patient unfortunately presents with worsening of his second toe ulcer on the left. This actually is opened up, appears more erythematous, and has bone noted at the joint where there is no longer maintains alignment. When I attempted to look between the toes the distal portion of the toe actually slipped medially along the joint line. Again I think that the lateral connective tissue has deteriorated at this site. I am concerned about more extensive osteomyelitis at this point. Objective Constitutional Well-nourished and well-hydrated in no acute distress. Vitals Time Taken: 3:02 PM, Height: 73 in, Weight: 207 lbs, BMI: 27.3, Temperature: 98.5 F, Pulse: 92 bpm, Respiratory Rate: 16 breaths/min, Blood Pressure: 122/83 mmHg. Respiratory normal breathing without difficulty. Psychiatric this patient is able to make decisions and demonstrates good insight into disease process. Alert and Oriented x 3. pleasant and cooperative. General Notes: Upon inspection patient's wound on the plantar aspect of the foot has reopened  unfortunately. In regard to the second toe this also appears to be doing much worse. I am very concerned about osteomyelitis at the second toe location as well. There really does not appear to be necrotic bone as of yet but nonetheless the connective tissue has been destroyed on the lateral aspect causing it to slip medially as far as the toe is concerned. I think this indicates a deeper infection at minimum. Integumentary (Hair, Skin) Wound #3R status is Open. Original cause of wound was Gradually Appeared. The wound is located on the Left,Lateral Foot. The wound measures 1.5cm length x 1.2cm width x 0.3cm depth; 1.414cm^2 area and 0.424cm^3 volume. There is Fat Layer (Subcutaneous Tissue) Exposed exposed. There is no tunneling or undermining noted. There is a medium amount of serosanguineous drainage noted. The wound margin is distinct with the outline attached to the wound base. There is small (1-33%) granulation within the wound bed. There is a large (67-100%) amount of necrotic tissue within the wound bed including Eschar and Adherent Slough. Wound #7 status is Open. Original cause of wound was Gradually Appeared. The wound is located on the Left T Second. The wound measures 0.5cm length x oe 1cm width x 0.3cm depth; 0.393cm^2 area and 0.118cm^3 volume. There is Fat Layer (Subcutaneous Tissue) Exposed exposed. There is no tunneling or undermining noted. There is a small amount of serosanguineous drainage noted. The wound margin is flat and intact. There is small (1-33%) red, pink granulation within the wound bed. There is a large (67-100%) amount of necrotic tissue within  the wound bed including Eschar and Adherent Slough. 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 There was a Chemical/Enzymatic/Mechanical debridement performed by Yevonne Pax, RN.Marland Kitchen Agent used was The [employer]. A time out was conducted at 16:00, prior to the start of the procedure. There was no bleeding. The procedure was tolerated well. Post Debridement Measurements: 1.5cm length x 1.2cm width x 0.3cm depth; 0.424cm^3 volume. Character of Wound/Ulcer Post Debridement requires further debridement. Severity of Tissue Post Debridement is: Fat layer exposed. Post procedure Diagnosis Wound #3R: Same as Pre-Procedure Plan Follow-up Appointments: Return Appointment in 2 weeks. - Friday Dressing Change Frequency: Wound #3R Left,Lateral Foot: Change dressing every day. Wound #7 Left T Second: oe Change dressing every day. Primary Wound Dressing: Wound #3R Left,Lateral Foot: Santyl Ointment Wound #7 Left T Second: oe Calcium Alginate with Silver Secondary Dressing: Wound #3R Left,Lateral Foot: Foam - foam donut Kerlix/Rolled Gauze Dry Gauze Wound #7 Left T Second: oe Kerlix/Rolled Gauze Dry Gauze Off-Loading: Multipodus Splint to: - both feet Radiology ordered were: MRI, left foot with/without contrast - diabetic foot ulcer left 2nd to and left lateral foot CPT Laboratory ordered were: Anerobic culture - left 2nd toe The following medication(s) was prescribed: doxycycline hyclate oral 100 mg capsule 1 1 capsule oral taken 2 times per day for 14 days. Do not take iron or a multivitamin with this medication starting 10/25/2019 1. I would recommend currently that we go ahead and initiate treatment with continuation of Santyl for the foot ulcer I think that is the best thing at this  point. 2. I am also can recommend at this time that we go ahead and use a silver alginate dressing at the toe location. 3. I am also going to suggest a wound culture which was obtained today in order to evaluate for what infection we may be dealing with here. 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 He is also been on doxycycline in the past and I am going to probably start him on the doxycycline at this point. 4. I am also going to suggest that the patient needs to appropriately offload and keep pressure off of his wound as well in general. I think this is of important. We will see patient back for reevaluation in 2 weeks here in the clinic. If anything worsens or changes patient will contact our office for additional recommendations. Electronic Signature(s) Signed: 10/26/2019 5:20:52 PM By: Zenaida Deed RN, BSN Signed: 10/27/2019 3:47:20 PM By: Lenda Kelp PA-C Previous Signature: 10/25/2019 4:25:30 PM Version By: Zenaida Deed RN, BSN Previous Signature: 10/25/2019 3:50:03 PM Version By: Lenda Kelp PA-C Entered By: Zenaida Deed on 10/26/2019 10:15:04 -------------------------------------------------------------------------------- SuperBill Details Patient Name: Date of Service: [name], [name] EN D. 10/25/2019 Medical Record Number: [id number] Patient Account Number: [id number] Date of Birth/Sex: Treating RN: Jul 08, 1960 (59 y.o. 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, left ankle and foot E11.51 Type 2 diabetes mellitus with diabetic peripheral angiopathy  without gangrene L97.521 Non-pressure chronic ulcer of other part of left foot limited to breakdown of skin Facility Procedures CPT4 Code: 45409811 Description: [id number] - DEBRIDE W/O ANES NON SELECT Modifier: Quantity: 1 Physician Procedures : CPT4 Code Description Modifier [id number] - WC PHYS LEVEL 4 - EST PT ICD-10 Diagnosis Description E11.621 Type 2 diabetes mellitus 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

## 2019-10-26 NOTE — Progress Notes (Addendum)
KRISTOFOR, MICHALOWSKI ([id number]) Visit Report for 10/25/2019 Arrival Information Details Patient Name: Date of Service: Frank Ours [place] D. 10/25/2019 2:30 PM Medical Record Number: [id number] Patient Account Number: [id number] Date of Birth/Sex: Treating RN: [birth year] (59 y.o. [name] Primary Care [name]: SYSTEM, PCP Other Clinician: Referring [name]: Treating [name]/Extender: [name] in Treatment: 90 Visit Information History Since Last Visit Added or deleted any medications: No Patient Arrived: Wheel Chair Any new allergies or adverse reactions: No Arrival Time: 15:01 Had a fall or experienced change in No Accompanied By: caregiver activities of daily living that may affect Transfer Assistance: None risk of falls: Patient Identification Verified: Yes Signs or symptoms of abuse/neglect since last visito No Secondary Verification Process Completed: Yes Hospitalized since last visit: No Patient Requires Transmission-Based Precautions: No Implantable device outside of the clinic excluding No Patient Has Alerts: Yes cellular tissue based products placed in the center Patient Alerts: R and L ABIs N/C since last visit: Has Dressing in Place as Prescribed: Yes Pain Present Now: No Electronic Signature(s) Signed: 10/26/2019 8:32:44 AM By: Sandre Kitty Entered By: Sandre Kitty on 10/25/2019 15:02:29 -------------------------------------------------------------------------------- Clinic Level of Care Assessment Details Patient Name: Date of Service: Frank Ours [place] D. 10/25/2019 2:30 PM Medical Record Number: [id number] Patient Account Number: [id number] Date of Birth/Sex: Treating RN: [birth year] (59 y.o. [name] Primary Care [name]: SYSTEM, PCP Other Clinician: Referring [name]: Treating [name]/Extender: [name] in Treatment: 22 Clinic Level of Care Assessment Items TOOL 4 Quantity  Score []  - 0 Use when only an EandM is performed on FOLLOW-UP visit ASSESSMENTS - Nursing Assessment / Reassessment X- 1 10 Reassessment of Co-morbidities (includes updates in patient status) X- 1 5 Reassessment of Adherence to Treatment Plan ASSESSMENTS - Wound and Skin A ssessment / Reassessment []  - 0 Simple Wound Assessment / Reassessment - one wound X- 2 5 Complex Wound Assessment / Reassessment - multiple wounds []  - 0 Dermatologic / Skin Assessment (not related to wound area) ASSESSMENTS - Focused Assessment []  - 0 Circumferential Edema Measurements - multi extremities []  - 0 Nutritional Assessment / Counseling / Intervention X- 1 5 Lower Extremity Assessment (monofilament, tuning fork, pulses) []  - 0 Peripheral Arterial Disease Assessment (using hand held doppler) ASSESSMENTS - Ostomy and/or Continence Assessment and Care []  - 0 Incontinence Assessment and Management []  - 0 Ostomy Care Assessment and Management (repouching, etc.) PROCESS - Coordination of Care X - Simple Patient / Family Education for ongoing care 1 15 []  - 0 Complex (extensive) Patient / Family Education for ongoing care X- 1 10 Staff obtains Programmer, systems, Records, T Results / Process Orders est X- 1 10 Staff telephones HHA, Nursing Homes / Clarify orders / etc []  - 0 Routine Transfer to another Facility (non-emergent condition) []  - 0 Routine Hospital Admission (non-emergent condition) []  - 0 New Admissions / Biomedical engineer / Ordering NPWT Apligraf, etc. , []  - 0 Emergency Hospital Admission (emergent condition) X- 1 10 Simple Discharge Coordination []  - 0 Complex (extensive) Discharge Coordination PROCESS - Special Needs []  - 0 Pediatric / Minor Patient Management []  - 0 Isolation Patient Management []  - 0 Hearing / Language / Visual special needs []  - 0 Assessment of Community assistance (transportation, D/C planning, etc.) []  - 0 Additional assistance / Altered  mentation []  - 0 Support Surface(s) Assessment (bed, cushion, seat, etc.) INTERVENTIONS - Wound Cleansing / Measurement []  - 0 Simple Wound Cleansing - one wound X-  2 5 Complex Wound Cleansing - multiple wounds X- 1 5 Wound Imaging (photographs - any number of wounds) []  - 0 Wound Tracing (instead of photographs) []  - 0 Simple Wound Measurement - one wound X- 2 5 Complex Wound Measurement - multiple wounds INTERVENTIONS - Wound Dressings X - Small Wound Dressing one or multiple wounds 2 10 []  - 0 Medium Wound Dressing one or multiple wounds []  - 0 Large Wound Dressing one or multiple wounds X- 1 5 Application of Medications - topical []  - 0 Application of Medications - injection INTERVENTIONS - Miscellaneous []  - 0 External ear exam []  - 0 Specimen Collection (cultures, biopsies, blood, body fluids, etc.) []  - 0 Specimen(s) / Culture(s) sent or taken to Lab for analysis []  - 0 Patient Transfer (multiple staff / / Similar devices) []  - 0 Simple Staple / Suture removal (25 or less) []  - 0 Complex Staple / Suture removal (26 or more) []  - 0 Hypo / Hyperglycemic Management (close monitor of Blood Glucose) []  - 0 Ankle / Brachial Index (ABI) - do not check if billed separately X- 1 5 Vital Signs Has the patient been seen at the hospital within the last three years: Yes Total Score: 130 Level Of Care: New/Established - Level 4 Electronic Signature(s) Signed: 10/25/2019 4:25:30 PM By: RN, BSN Entered By: on 10/25/2019 15:49:11 -------------------------------------------------------------------------------- Encounter Discharge Information Details Patient Name: Date of Service: , EN D. 10/25/2019 2:30 PM Medical Record Number: Patient Account Number: Nurse, adult Date of Birth/Sex: Treating RN: October 19, 1960 (59 y.o. Primary Care Izamar Linden: SYSTEM, PCP Other Clinician: Referring  [name]: Treating [name]/Extender: in Treatment: 22 Encounter Discharge Information Items Discharge Condition: Stable Ambulatory Status: Wheelchair Discharge Destination: Skilled Nursing Facility Telephoned: No Orders Sent: Yes Transportation: Other Accompanied By: caregiver Schedule Follow-up Appointment: Yes Clinical Summary of Care: Patient Declined Electronic Signature(s) Signed: 10/25/2019 4:51:47 PM By: Zenaida Deed RN Entered By: Zenaida Deed on 10/25/2019 16:00:41 -------------------------------------------------------------------------------- Lower Extremity Assessment Details Patient Name: Date of Service: [name] Frank [place] D. 10/25/2019 2:30 PM Medical Record Number: [id number] Patient Account Number: [id number] Date of Birth/Sex: Treating RN: 08-Oct-1960 (59 y.o. Melonie [place] Primary Care Arun Herrod: SYSTEM, PCP Other Clinician: Referring Jaquetta Currier: Treating Zanae Kuehnle/Extender: [place] Bing in Treatment: 22 Edema Assessment Assessed: [Left: No] [Right: No] Edema: [Left: N] [Right: o] Calf Left: Right: Point of Measurement: cm From Medial Instep 29 cm cm Ankle Left: Right: Point of Measurement: cm From Medial Instep 18 cm cm Electronic Signature(s) Signed: 10/25/2019 4:51:47 PM By: Yevonne Pax RN Entered By: Yevonne Pax on 10/25/2019 15:26:12 -------------------------------------------------------------------------------- Multi-Disciplinary Care Plan Details Patient Name: Date of Service: [name], Frank [place] EN D. 10/25/2019 2:30 PM Medical Record Number: [id number] Patient Account Number: [id number] Date of Birth/Sex: Treating RN: November 27, 1960 (59 y.o. Melonie [place] Primary Care Oluwaferanmi Wain: SYSTEM, PCP Other Clinician: Referring Oland Arquette: Treating Maximum Reiland/Extender: [place] Bing in Treatment: 22 Active Inactive Nutrition Nursing Diagnoses: Imbalanced  nutrition Goals: Patient/caregiver will maintain therapeutic glucose control Date Initiated: 05/19/2019 Target Resolution Date: 11/24/2019 Goal Status: Active Interventions: Provide education on elevated blood sugars and impact on wound healing Notes: Wound/Skin Impairment Nursing Diagnoses: Impaired tissue integrity Goals: Ulcer/skin breakdown will have a volume reduction of 50% by week 8 Date Initiated: 05/19/2019 Target Resolution Date: 11/24/2019 Goal Status: Active Interventions: Provide education on ulcer and skin care Notes: Electronic Signature(s) Signed: 10/25/2019 4:25:30 PM  By: Zenaida Deed RN, BSN Entered By: Zenaida Deed on 10/25/2019 15:47:50 -------------------------------------------------------------------------------- Pain Assessment Details Patient Name: Date of Service: [name], Frank [place] EN D. 10/25/2019 2:30 PM Medical Record Number: [id number] Patient Account Number: [id number] Date of Birth/Sex: Treating RN: 08/08/1960 (59 y.o. Damaris Schooner Primary Care Alfonso Carden: SYSTEM, PCP Other Clinician: Referring Larrie Fraizer: Treating Westlyn Glaza/Extender: [place] Bing in Treatment: 22 Active Problems Location of Pain Severity and Description of Pain Patient Has Paino No Site Locations Pain Management and Medication Current Pain Management: Electronic Signature(s) Signed: 10/25/2019 4:25:30 PM By: Zenaida Deed RN, BSN Signed: 10/26/2019 8:32:44 AM By: Karl Ito Entered By: Karl Ito on 10/25/2019 15:02:58 -------------------------------------------------------------------------------- Patient/Caregiver Education Details Patient Name: Date of Service: Arva Chafe D. 6/23/2021andnbsp2:30 PM Medical Record Number: [id number] Patient Account Number: [id number] Date of Birth/Gender: Treating RN: August 27, 1960 (59 y.o. Damaris Schooner Primary Care Physician: SYSTEM, PCP Other Clinician: Referring Physician: Treating  Physician/Extender: [place] Bing in Treatment: 22 Education Assessment Education Provided To: Patient Education Topics Provided Elevated Blood Sugar/ Impact on Healing: Methods: Explain/Verbal Responses: Reinforcements needed, State content correctly Wound/Skin Impairment: Methods: Explain/Verbal Responses: Reinforcements needed, State content correctly Electronic Signature(s) Signed: 10/25/2019 4:25:30 PM By: Zenaida Deed RN, BSN Entered By: Zenaida Deed on 10/25/2019 15:48:34 -------------------------------------------------------------------------------- Wound Assessment Details Patient Name: Date of Service: [name], Frank [place] EN D. 10/25/2019 2:30 PM Medical Record Number: [id number] Patient Account Number: [id number] Date of Birth/Sex: Treating RN: Apr 26, 1961 (59 y.o. Damaris Schooner Primary Care Daden Mahany: SYSTEM, PCP Other Clinician: Referring Klair Leising: Treating Omah Dewalt/Extender: [place] Bing in Treatment: 22 Wound Status Wound Number: 3R Primary Diabetic Wound/Ulcer of the Lower Extremity Etiology: Wound Location: Left, Lateral Foot Wound Open Wounding Event: Gradually Appeared Status: Date Acquired: 03/05/2019 Comorbid Glaucoma, Coronary Artery Disease, Deep Vein Thrombosis, Weeks Of Treatment: 22 History: Hypertension, Peripheral Arterial Disease, Type II Diabetes, Clustered Wound: No Dementia, Neuropathy, Seizure Disorder Photos Wound Measurements Length: (cm) 1.5 % Re Width: (cm) 1.2 % Re Depth: (cm) 0.3 Epit Area: (cm) 1.414 Tun Volume: (cm) 0.424 Und duction in Area: 51.9% duction in Volume: 92% helialization: Large (67-100%) neling: No ermining: No Wound Description Classification: Grade 2 Fou Wound Margin: Distinct, outline attached Slo Exudate Amount: Medium Exudate Type: Serosanguineous Exudate Color: red, brown l Odor After Cleansing: No ugh/Fibrino Yes Wound Bed Granulation Amount: Small  (1-33%) Exposed Structure Necrotic Amount: Large (67-100%) Fascia Exposed: No Necrotic Quality: Eschar, Adherent Slough Fat Layer (Subcutaneous Tissue) Exposed: Yes Tendon Exposed: No Muscle Exposed: No Joint Exposed: No Bone Exposed: No Treatment Notes Wound #3R (Left, Lateral Foot) 1. Cleanse With Wound Cleanser 3. Primary Dressing Applied Santyl 4. Secondary Dressing Dry Gauze Roll Gauze 5. Secured With Secretary/administrator) Signed: 10/25/2019 4:25:30 PM By: Zenaida Deed RN, BSN Signed: 10/25/2019 5:20:20 PM By: Marlinda Mike Entered By: Marlinda Mike on 10/25/2019 16:06:43 -------------------------------------------------------------------------------- Wound Assessment Details Patient Name: Date of Service: [name], Frank [place] EN D. 10/25/2019 2:30 PM Medical Record Number: [id number] Patient Account Number: [id number] Date of Birth/Sex: Treating RN: 18-Sep-1960 (59 y.o. Damaris Schooner Primary Care Freeda Spivey: SYSTEM, PCP Other Clinician: Referring Sidnie Swalley: Treating Terianne Thaker/Extender: [place] Bing in Treatment: 22 Wound Status Wound Number: 7 Primary Diabetic Wound/Ulcer of the Lower Extremity Etiology: Wound Location: Left T Second oe Wound Open Wounding Event: Gradually Appeared Status: Date Acquired: 09/22/2019 Comorbid Glaucoma, Coronary Artery Disease, Deep Vein Thrombosis, Weeks Of Treatment: 4 History: Hypertension, Peripheral Arterial Disease, Type II Diabetes,  Clustered Wound: No Dementia, Neuropathy, Seizure Disorder Photos Wound Measurements Length: (cm) 0.5 Width: (cm) 1 Depth: (cm) 0.3 Area: (cm) 0.393 Volume: (cm) 0.118 % Reduction in Area: -138.2% % Reduction in Volume: -637.5% Epithelialization: Small (1-33%) Tunneling: No Undermining: No Wound Description Classification: Grade 2 Wound Margin: Flat and Intact Exudate Amount: Small Exudate Type: Serosanguineous Exudate Color: red, brown Foul Odor After  Cleansing: No Slough/Fibrino Yes Wound Bed Granulation Amount: Small (1-33%) Exposed Structure Granulation Quality: Red, Pink Fascia Exposed: No Necrotic Amount: Large (67-100%) Fat Layer (Subcutaneous Tissue) Exposed: Yes Necrotic Quality: Eschar, Adherent Slough Tendon Exposed: No Muscle Exposed: No Joint Exposed: No Bone Exposed: No Treatment Notes Wound #7 (Left Toe Second) 1. Cleanse With Wound Cleanser 3. Primary Dressing Applied Calcium Alginate Ag 4. Secondary Dressing Dry Gauze 5. Secured With Secretary/administrator) Signed: 10/26/2019 5:20:52 PM By: Zenaida Deed RN, BSN Signed: 10/27/2019 5:12:49 PM By: Marlinda Mike Previous Signature: 10/25/2019 4:25:30 PM Version By: Zenaida Deed RN, BSN Previous Signature: 10/25/2019 4:51:47 PM Version By: Yevonne Pax RN Entered By: Marlinda Mike on 10/26/2019 13:12:59 -------------------------------------------------------------------------------- Vitals Details Patient Name: Date of Service: [name], Frank [place] EN D. 10/25/2019 2:30 PM Medical Record Number: [id number] Patient Account Number: [id number] Date of Birth/Sex: Treating RN: 12-Aug-1960 (59 y.o. Damaris Schooner Primary Care Teagyn Fishel: SYSTEM, PCP Other Clinician: Referring Thaily Hackworth: Treating Shan Padgett/Extender: [place] Bing in Treatment: 22 Vital Signs Time Taken: 15:02 Temperature (F): 98.5 Height (in): 73 Pulse (bpm): 92 Weight (lbs): 207 Respiratory Rate (breaths/min): 16 Body Mass Index (BMI): 27.3 Blood Pressure (mmHg): 122/83 Reference Range: 80 - 120 mg / dl Electronic Signature(s) Signed: 10/26/2019 8:32:44 AM By: Karl Ito Entered By: Karl Ito on 10/25/2019 15:02:48

## 2019-10-28 ENCOUNTER — Other Ambulatory Visit: Payer: Self-pay | Admitting: Physician Assistant

## 2019-10-28 ENCOUNTER — Other Ambulatory Visit (HOSPITAL_COMMUNITY): Payer: Self-pay | Admitting: Physician Assistant

## 2019-10-28 DIAGNOSIS — E08621 Diabetes mellitus due to underlying condition with foot ulcer: Secondary | ICD-10-CM

## 2019-10-28 LAB — AEROBIC CULTURE W GRAM STAIN (SUPERFICIAL SPECIMEN)

## 2019-11-10 ENCOUNTER — Encounter (HOSPITAL_BASED_OUTPATIENT_CLINIC_OR_DEPARTMENT_OTHER): Payer: Medicare Other | Admitting: Internal Medicine

## 2019-11-11 ENCOUNTER — Other Ambulatory Visit
Admission: RE | Admit: 2019-11-11 | Discharge: 2019-11-11 | Disposition: A | Discharge status: Home / Self Care | Payer: Medicare Other | Source: Ambulatory Visit | Attending: Internal Medicine | Admitting: Internal Medicine

## 2019-11-11 LAB — CBC WITH DIFFERENTIAL/PLATELET
Abs Immature Granulocytes: 0.28 10*3/uL — ABNORMAL HIGH (ref 0.00–0.07)
Basophils Absolute: 0 10*3/uL (ref 0.0–0.1)
Basophils Relative: 0 %
Eosinophils Absolute: 0.1 10*3/uL (ref 0.0–0.5)
Eosinophils Relative: 1 %
HCT: 37.2 % — ABNORMAL LOW (ref 39.0–52.0)
Hemoglobin: 11.2 g/dL — ABNORMAL LOW (ref 13.0–17.0)
Immature Granulocytes: 4 %
Lymphocytes Relative: 26 %
Lymphs Abs: 1.8 10*3/uL (ref 0.7–4.0)
MCH: 27.8 pg (ref 26.0–34.0)
MCHC: 30.1 g/dL (ref 30.0–36.0)
MCV: 92.3 fL (ref 80.0–100.0)
Monocytes Absolute: 0.8 10*3/uL (ref 0.1–1.0)
Monocytes Relative: 12 %
Neutro Abs: 4 10*3/uL (ref 1.7–7.7)
Neutrophils Relative %: 57 %
Platelets: 183 10*3/uL (ref 150–400)
RBC: 4.03 MIL/uL — ABNORMAL LOW (ref 4.22–5.81)
RDW: 16.2 % — ABNORMAL HIGH (ref 11.5–15.5)
WBC: 7 10*3/uL (ref 4.0–10.5)
nRBC: 0 % (ref 0.0–0.2)

## 2019-11-11 LAB — BASIC METABOLIC PANEL
Anion gap: 13 (ref 5–15)
BUN: 35 mg/dL — ABNORMAL HIGH (ref 6–20)
CO2: 23 mmol/L (ref 22–32)
Calcium: 8.8 mg/dL — ABNORMAL LOW (ref 8.9–10.3)
Chloride: 107 mmol/L (ref 98–111)
Creatinine, Ser: 1.54 mg/dL — ABNORMAL HIGH (ref 0.61–1.24)
GFR calc Af Amer: 56 mL/min — ABNORMAL LOW (ref >60)
GFR calc non Af Amer: 49 mL/min — ABNORMAL LOW (ref >60)
Glucose, Bld: 75 mg/dL (ref 70–99)
Potassium: 4.6 mmol/L (ref 3.5–5.1)
Sodium: 143 mmol/L (ref 135–145)

## 2019-11-17 ENCOUNTER — Encounter (HOSPITAL_BASED_OUTPATIENT_CLINIC_OR_DEPARTMENT_OTHER): Payer: Medicare Other | Admitting: Internal Medicine

## 2019-11-23 ENCOUNTER — Encounter (HOSPITAL_BASED_OUTPATIENT_CLINIC_OR_DEPARTMENT_OTHER): Payer: Medicare Other | Attending: Internal Medicine | Admitting: Internal Medicine

## 2019-11-23 ENCOUNTER — Other Ambulatory Visit: Payer: Self-pay

## 2019-11-23 DIAGNOSIS — E1151 Type 2 diabetes mellitus with diabetic peripheral angiopathy without gangrene: Secondary | ICD-10-CM | POA: Insufficient documentation

## 2019-11-23 DIAGNOSIS — L97524 Non-pressure chronic ulcer of other part of left foot with necrosis of bone: Secondary | ICD-10-CM | POA: Insufficient documentation

## 2019-11-23 DIAGNOSIS — E11621 Type 2 diabetes mellitus with foot ulcer: Secondary | ICD-10-CM | POA: Diagnosis not present

## 2019-11-23 DIAGNOSIS — M86472 Chronic osteomyelitis with draining sinus, left ankle and foot: Secondary | ICD-10-CM | POA: Insufficient documentation

## 2019-11-23 DIAGNOSIS — L97828 Non-pressure chronic ulcer of other part of left lower leg with other specified severity: Secondary | ICD-10-CM | POA: Diagnosis not present

## 2019-11-23 NOTE — Progress Notes (Signed)
Frank, [name] ([id number]) Visit Report for 11/23/2019 Debridement Details Patient Name: Date of Service: [name] [place] D. 11/23/2019 9:45 A M Medical Record Number: [id number] Patient Account Number: [id number] Date of Birth/Sex: Treating RN: July 09, 1960 (59 y.o. [name] Primary Care Provider: SYSTEM, PCP Other Clinician: Referring Provider: Treating Provider/Extender: Thea Silversmith in Treatment: 26 Debridement Performed for Assessment: Wound #3R Left,Lateral Foot Performed By: Clinician Levan Hurst, RN Debridement Type: Chemical/Enzymatic/Mechanical Agent Used: Santyl Severity of Tissue Pre Debridement: Fat layer exposed Level of Consciousness (Pre-procedure): Awake and Alert Pre-procedure Verification/Time Out Yes - 10:20 Taken: Start Time: 10:20 Bleeding: None End Time: 10:20 Procedural Pain: 0 Post Procedural Pain: 0 Response to Treatment: Procedure was tolerated well Level of Consciousness (Post- Awake and Alert procedure): Post Debridement Measurements of Total Wound Length: (cm) 3.8 Width: (cm) 2 Depth: (cm) 1 Volume: (cm) 5.969 Character of Wound/Ulcer Post Debridement: Requires Further Debridement Severity of Tissue Post Debridement: Fat layer exposed Post Procedure Diagnosis Same as Pre-procedure Electronic Signature(s) Signed: 11/23/2019 5:02:14 PM By: Levan Hurst RN, BSN Signed: 11/23/2019 5:29:29 PM By: Linton Ham MD Entered By: Levan Hurst on 11/23/2019 13:16:24 -------------------------------------------------------------------------------- Debridement Details Patient Name: Date of Service: [name], [name] [place] D. 11/23/2019 9:45 A M Medical Record Number: [id number] Patient Account Number: [id number] Date of Birth/Sex: Treating RN: 17-Oct-1960 (59 y.o. [name] Primary Care Provider: SYSTEM, PCP Other Clinician: Referring Provider: Treating Provider/Extender: Thea Silversmith in  Treatment: 26 Debridement Performed for Assessment: Wound #8 Left,Proximal,Lateral Lower Leg Performed By: Clinician Levan Hurst, RN Debridement Type: Chemical/Enzymatic/Mechanical Agent Used: Santyl Level of Consciousness (Pre-procedure): Awake and Alert Pre-procedure Verification/Time Out Yes - 10:20 Taken: Start Time: 10:20 Bleeding: None End Time: 10:20 Procedural Pain: 0 Post Procedural Pain: 0 Response to Treatment: Procedure was tolerated well Level of Consciousness (Post- Awake and Alert procedure): Post Debridement Measurements of Total Wound Length: (cm) 2.3 Stage: Unstageable/Unclassified Width: (cm) 1.5 Depth: (cm) 0.1 Volume: (cm) 0.271 Character of Wound/Ulcer Post Debridement: Requires Further Debridement Post Procedure Diagnosis Same as Pre-procedure Electronic Signature(s) Signed: 11/23/2019 5:02:14 PM By: Levan Hurst RN, BSN Signed: 11/23/2019 5:29:29 PM By: Linton Ham MD Entered By: Levan Hurst on 11/23/2019 13:16:46 -------------------------------------------------------------------------------- HPI Details Patient Name: Date of Service: [name], [name] EN D. 11/23/2019 9:45 A M Medical Record Number: [id number] Patient Account Number: [id number] Date of Birth/Sex: Treating RN: 09-21-1960 (59 y.o. [name] Primary Care Provider: SYSTEM, PCP Other Clinician: Referring Provider: Treating Provider/Extender: Thea Silversmith in Treatment: 40 History of Present Illness HPI Description: ADMISSION 05/19/2019 [name] is now a 59 year old man with type 2 diabetes. He was actually in this clinic in 2006 in 2008. I do not have these records. More recently I actually looked after him in primary care with [facility] up until 2016. Notable for the fact that I treated him for osteomyelitis in the right foot for an MRSA infection in the facility in 2014 or thereabouts. Gave him a prolonged course of vancomycin in the  facility. My notes at the time do not list him has having significant PAD. He has severe diabetic neuropathy. The patient apparently has had wounds on his feet since November. This includes the right second toe and the left foot at the mid part of the fifth metatarsal. They are using Santyl on the left foot I am not clear what they are using to the right toe. I do not know if there is been any  imaging studies done. It does not appear that his vascular status is been rechecked. 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 The patient is on a combination of Augmentin and Cipro both for 2 weeks. The patient had his arterial studies done at [facility] yesterday I will need to research these results. We are using silver alginate on the left lateral foot and on the right second toe 2/12; he should be completing his antibiotics Augmentin and Cipro. Unfortunately had his arterial studies done at radiology at [facility] they did not do right ABI or left ABIs or TBI's for that matter even though all of these were ordered. Nevertheless on the right there was triphasic waveforms of the common femoral artery, profunda femoris SFA, popliteal artery. Triphasic posterior tibial and anterior tibial as well as peroneal arteries. On the left there was a definite drop off of the distal SFA and popliteal were monophasic. Comment made that they may need to correlate with an ABI on the left even though this was ordered. We will call up and see what the problem was there. He has wounds on the left lateral fifth metatarsal head  and the right second toe. Paradoxically both of the wounds look somewhat better 2/26; patient had his arterial studies yesterday at [facility]. These showed an ABI of 0.97 at the dorsalis pedis on the right with a TBI of 0.54. Biphasic waveforms. On the left ABI at 0.94 at the dorsalis pedis TBI at 0.43. The area on the left lateral foot appears to be better. Right second toe was closed. At discharge he was noted to have wounds on the left medial fifth and the left lateral fourth and the webspace. Both toes were tender and erythematous 3/12. The area on the left lateral foot appears to be contracting. He has areas on the left medial third and the lateral fourth. These may be friction areas from the toes pressing against each other. X-rays of the left foot did not comment osteomyelitis done at the facility 3/26 2-week follow-up. The patient arrives today with the area that we have been following on the left lateral foot superficial and looking better. However last time he was here he had new open areas on the left medial third and lateral fourth toes. The lateral fourth toe was done well however the medial third toe has exposed bone. The toe itself is erythemtous and warm 4/30; the patient's left third toe and fourth toe are both closed. The area on the left lateral foot still has some probing depth. Weeping tissue no overt infection does not probe to bone. We have been using silver alginate on all these areas The patient has what looks to be a deep tissue injury on the medial part of the left first metatarsal head there is no open area here however 5/21; the patient's problematic area on the left lateral foot is still open but with some improvement. He has new open areas on the lateral part of the left second toe and the DTI injury over the metatarsal head is open today with a very superficial wound 6/4; the patient's original area on the left lateral foot appears to have healed. He  does have what  appears to be a denuded old blister just distal to this on the base of the left fifth metatarsal. There is nothing open here. There is nothing open on the left first metatarsal head lateral aspect if we are still having problems and concerns about 2 weeks ago. The only open area is on the lateral part of the left second toe 10/25/2019 upon evaluation today patient unfortunately presents with worsening of his second toe ulcer on the left. This actually is opened up, appears more erythematous, and has bone noted at the joint where there is no longer maintains alignment. When I attempted to look between the toes the distal portion of the toe actually slipped medially along the joint line. Again I think that the lateral connective tissue has deteriorated at this site. I am concerned about more extensive osteomyelitis at this point. 7/22; this is a patient I have not seen in about 7 weeks although I note he was seen about a month ago. There have been a number of appointment misses because of patient refusals. As I understand things from our intake nurse when he came in the last time the left lateral foot had reopened. He has a worsening area on the lateral part of the left second toe. An MRI is been booked for Monday. He is still on the Bactrim DS that was prescribed last time and started on July 9. He should just about be finished that now. He comes in today with a necrotic area over the left lateral fifth metatarsal head, a deep wound on the left lateral second toe. He also has a new wound over the fibular head on the left. [place] this was new but it did have a bandage on it. Perhaps a deep tissue injury over the right fifth metatarsal head laterally. We o have been using Santyl on the left lateral foot silver alginate on the right second toe Electronic Signature(s) Signed: 11/23/2019 5:29:29 PM By: Linton Ham MD Entered By: Linton Ham on 11/23/2019  11:18:52 -------------------------------------------------------------------------------- Physical Exam Details Patient Name: Date of Service: [name], [name] EN D. 11/23/2019 9:45 A M Medical Record Number: [id number] Patient Account Number: [id number] Date of Birth/Sex: Treating RN: November 17, 1960 (59 y.o. [name] Primary Care Provider: SYSTEM, PCP Other Clinician: Referring Provider: Treating Provider/Extender: Thea Silversmith in Treatment: 26 Constitutional Sitting or standing Blood Pressure is within target range for patient.. Pulse regular and within target range for patient.Marland Kitchen Respirations regular, non-labored and within target range.. Temperature is normal and within the target range for the patient.Marland Kitchen Appears in no distress. Respiratory work of breathing is normal. Cardiovascular Pedal pulses are palpable. Psychiatric appears at normal baseline. Notes Wound exam Quite a marked deterioration since the last time I saw the left lateral foot completely necrotic surface. I would not be surprised if this goes to bone. Deep hole over the PIP of the left lateral second toe. I think there is joint destruction here based on clinical exam Has a new unstageable probably pressure area over the fibular head on the left. Perhaps a deep tissue injury over the right lateral fifth met head Electronic Signature(s) Signed: 11/23/2019 5:29:29 PM By: Linton Ham MD Entered By: Linton Ham on 11/23/2019 11:21:07 -------------------------------------------------------------------------------- Physician Orders Details Patient Name: Date of Service: [name], [name] D. 11/23/2019 9:45 A M Medical Record Number: [id number] Patient Account Number: [id number] Date of Birth/Sex: Treating RN: 1960/07/02 (59 y.o. [name] Primary Care Provider: SYSTEM, PCP  Other Clinician: Referring Provider: Treating Provider/Extender: Thea Silversmith in  Treatment: 26 Verbal / Phone Orders: No Diagnosis Coding ICD-10 Coding Code Description E11.621 Type 2 diabetes mellitus with foot ulcer L97.524 Non-pressure chronic ulcer of other part of left foot with necrosis of bone M86.472 Chronic osteomyelitis with draining sinus, left ankle and foot E11.51 Type 2 diabetes mellitus with diabetic peripheral angiopathy without gangrene L97.521 Non-pressure chronic ulcer of other part of left foot limited to breakdown of skin Follow-up Appointments Return Appointment in 2 weeks. Dressing Change Frequency Change dressing every day. - all wounds Wound Cleansing Clean wound with Wound Cleanser - or normal saline Primary Wound Dressing Foam - on right lateral foot for protection Wound #3R Left,Lateral Foot Santyl Ointment Wound #8 Left,Proximal,Lateral Lower Leg Santyl Ointment Wound #7 Left T Second oe Calcium Alginate with Silver Secondary Dressing Wound #3R Left,Lateral Foot Foam - foam donut Kerlix/Rolled Gauze Dry Gauze Wound #7 Left T Second oe Kerlix/Rolled Gauze Dry Gauze Wound #8 Left,Proximal,Lateral Lower Leg Foam Border Off-Loading Multipodus Splint to: - both Museum/gallery curator) Signed: 11/23/2019 5:02:14 PM By: Levan Hurst RN, BSN Signed: 11/23/2019 5:29:29 PM By: Linton Ham MD Entered By: Levan Hurst on 11/23/2019 10:17:05 -------------------------------------------------------------------------------- Problem List Details Patient Name: Date of Service: [name], [name] EN D. 11/23/2019 9:45 A M Medical Record Number: [id number] Patient Account Number: [id number] Date of Birth/Sex: Treating RN: 1960/09/30 (59 y.o. [name] Primary Care Provider: SYSTEM, PCP Other Clinician: Referring Provider: Treating Provider/Extender: Thea Silversmith in Treatment: 26 Active Problems ICD-10 Encounter Code Description Active Date MDM Diagnosis E11.621 Type 2 diabetes mellitus with  foot ulcer 05/19/2019 No Yes L97.524 Non-pressure chronic ulcer of other part of left foot with necrosis of bone 05/19/2019 No Yes M86.472 Chronic osteomyelitis with draining sinus, left ankle and foot 08/11/2019 No Yes E11.51 Type 2 diabetes mellitus with diabetic peripheral angiopathy without gangrene 05/19/2019 No Yes L97.521 Non-pressure chronic ulcer of other part of left foot limited to breakdown of 06/30/2019 No Yes skin L97.828 Non-pressure chronic ulcer of other part of left lower leg with other specified 11/23/2019 No Yes severity Inactive Problems ICD-10 Code Description Active Date Inactive Date L97.514 Non-pressure chronic ulcer of other part of right foot with necrosis of bone 05/19/2019 05/19/2019 Resolved Problems Electronic Signature(s) Signed: 11/23/2019 5:29:29 PM By: Linton Ham MD Entered By: Linton Ham on 11/23/2019 10:20:23 -------------------------------------------------------------------------------- Progress Note Details Patient Name: Date of Service: [name], [name] EN D. 11/23/2019 9:45 A M Medical Record Number: [id number] Patient Account Number: [id number] Date of Birth/Sex: Treating RN: 05/15/1960 (59 y.o. [name] Primary Care Provider: SYSTEM, PCP Other Clinician: Referring Provider: Treating Provider/Extender: Thea Silversmith in Treatment: 26 Subjective History of Present Illness (HPI) ADMISSION 05/19/2019 Mr. Krutz is now a 59 year old man with type 2 diabetes. He was actually in this clinic in 2006 in 2008. I do not have these records. More recently I actually looked after him in primary care with [facility] up until 2016. Notable for the fact that I treated him for osteomyelitis in the right foot for an MRSA infection in the facility in 2014 or thereabouts. Gave him a prolonged course of vancomycin in the facility. My notes at the time do not list him has having significant PAD. He has severe diabetic  neuropathy. The patient apparently has had wounds on his feet since November. This includes the right second toe and the left foot at the mid part of  the fifth metatarsal. They are using Santyl on the left foot I am not clear what they are using to the right toe. I do not know if there is been any imaging studies done. It does not appear that his vascular status is been rechecked. 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 The patient is on a combination of Augmentin and Cipro both for 2 weeks. The patient had his arterial studies done at [facility] yesterday I will need to research these results. We are using silver alginate on the left lateral foot and on the right second toe 2/12; he should be completing his antibiotics Augmentin and Cipro. Unfortunately had his arterial studies done at radiology at [facility] they did not do right ABI or left ABIs or TBI's for that matter even though all of these were ordered. Nevertheless on the right there was triphasic waveforms of the common femoral artery, profunda femoris SFA, popliteal artery. Triphasic posterior tibial and anterior tibial as well as peroneal arteries. On the left there was a definite drop off of the distal SFA and popliteal were monophasic. Comment made that they may need to correlate with an ABI on the left even though this was ordered. We will call up and see what the problem was there. He has wounds on the left lateral fifth metatarsal head and the right second toe. Paradoxically both of the wounds look somewhat better 2/26; patient had  his arterial studies yesterday at [facility]. These showed an ABI of 0.97 at the dorsalis pedis on the right with a TBI of 0.54. Biphasic waveforms. On the left ABI at 0.94 at the dorsalis pedis TBI at 0.43. The area on the left lateral foot appears to be better. Right second toe was closed. At discharge he was noted to have wounds on the left medial fifth and the left lateral fourth and the webspace. Both toes were tender and erythematous 3/12. The area on the left lateral foot appears to be contracting. He has areas on the left medial third and the lateral fourth. These may be friction areas from the toes pressing against each other. X-rays of the left foot did not comment osteomyelitis done at the facility 3/26 2-week follow-up. The patient arrives today with the area that we have been following on the left lateral foot superficial and looking better. However last time he was here he had new open areas on the left medial third and lateral fourth toes. The lateral fourth toe was done well however the medial third toe has exposed bone. The toe itself is erythemtous and warm 4/30; the patient's left third toe and fourth toe are both closed. The area on the left lateral foot still has some probing depth. Weeping tissue no overt infection does not probe to bone. We have been using silver alginate on all these areas The patient has what looks to be a deep tissue injury on the medial part of the left first metatarsal head there is no open area here however 5/21; the patient's problematic area on the left lateral foot is still open but with some improvement. He has new open areas on the lateral part of the left second  toe and the DTI injury over the metatarsal head is open today with a very superficial wound 6/4; the patient's original area on the left lateral foot appears to have healed. He does have what appears to be a denuded old blister just distal to this on the base of the left fifth metatarsal.  There is nothing open here. There is nothing open on the left first metatarsal head lateral aspect if we are still having problems and concerns about 2 weeks ago. The only open area is on the lateral part of the left second toe 10/25/2019 upon evaluation today patient unfortunately presents with worsening of his second toe ulcer on the left. This actually is opened up, appears more erythematous, and has bone noted at the joint where there is no longer maintains alignment. When I attempted to look between the toes the distal portion of the toe actually slipped medially along the joint line. Again I think that the lateral connective tissue has deteriorated at this site. I am concerned about more extensive osteomyelitis at this point. 7/22; this is a patient I have not seen in about 7 weeks although I note he was seen about a month ago. There have been a number of appointment misses because of patient refusals. As I understand things from our intake nurse when he came in the last time the left lateral foot had reopened. He has a worsening area on the lateral part of the left second toe. An MRI is been booked for Monday. He is still on the Bactrim DS that was prescribed last time and started on July 9. He should just about be finished that now. He comes in today with a necrotic area over the left lateral fifth metatarsal head, a deep wound on the left lateral second toe. He also has a new wound over the fibular head on the left. [place] this was new but it did have a bandage on it. Perhaps a deep tissue injury over the right fifth metatarsal head laterally. We o have been using Santyl on the left lateral foot silver alginate on the right second toe Objective Constitutional Sitting or standing Blood Pressure is within target range for patient.. Pulse regular and within target range for patient.Marland Kitchen Respirations regular, non-labored and within target range.. Temperature is normal and within the target range for  the patient.Marland Kitchen Appears in no distress. Vitals Time Taken: 9:25 AM, Height: 73 in, Weight: 207 lbs, BMI: 27.3, Temperature: 97.9 F, Pulse: 83 bpm, Respiratory Rate: 16 breaths/min, Blood Pressure: 126/72 mmHg. Respiratory work of breathing is normal. Cardiovascular Pedal pulses are palpable. Psychiatric appears at normal baseline. General Notes: Wound exam ooQuite a marked deterioration since the last time I saw the left lateral foot completely necrotic surface. I would not be surprised if this goes to bone. ooDeep hole over the PIP of the left lateral second toe. I think there is joint destruction here based on clinical exam ooHas a new unstageable probably pressure area over the fibular head on the left. ooPerhaps a deep tissue injury over the right lateral fifth met head Integumentary (Hair, Skin) Wound #3R status is Open. Original cause of wound was Gradually Appeared. The wound is located on the Left,Lateral Foot. The wound measures 3.8cm length x 2cm width x 1cm depth; 5.969cm^2 area and 5.969cm^3 volume. There is Fat Layer (Subcutaneous Tissue) Exposed exposed. There is no tunneling or undermining noted. There is a medium amount of serosanguineous drainage noted. The wound margin is distinct with the outline  attached to the wound base. There is no granulation within the wound bed. There is a large (67-100%) amount of necrotic tissue within the wound bed including Adherent Slough. Wound #7 status is Open. Original cause of wound was Gradually Appeared. The wound is located on the Left T Second. The wound measures 0.7cm length x oe 0.8cm width x 0.5cm depth; 0.44cm^2 area and 0.22cm^3 volume. There is Fat Layer (Subcutaneous Tissue) Exposed exposed. There is no tunneling or undermining noted. There is a small amount of serosanguineous drainage noted. The wound margin is flat and intact. There is medium (34-66%) red granulation within the wound bed. There is a medium (34-66%) amount of  necrotic tissue within the wound bed including Adherent Slough. Wound #8 status is Open. Original cause of wound was Pressure Injury. The wound is located on the Left,Proximal,Lateral Lower Leg. The wound measures 2.3cm length x 1.5cm width x 0.1cm depth; 2.71cm^2 area and 0.271cm^3 volume. There is no tunneling or undermining noted. There is a medium amount of serosanguineous drainage noted. The wound margin is flat and intact. There is no granulation within the wound bed. There is a large (67-100%) amount of necrotic tissue within the wound bed including Adherent Slough. 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 Dressing Change Frequency: Change dressing every day. - all wounds Wound Cleansing: Clean wound with Wound Cleanser - or normal saline Primary Wound Dressing: Foam - on right lateral foot for protection Wound #3R Left,Lateral Foot: Santyl Ointment Wound #8 Left,Proximal,Lateral Lower Leg: Santyl Ointment Wound #7 Left T Second: oe Calcium Alginate with Silver Secondary Dressing: Wound #3R Left,Lateral Foot: Foam - foam donut Kerlix/Rolled Gauze Dry Gauze Wound #7 Left T Second: oe Kerlix/Rolled Gauze Dry Gauze Wound #8 Left,Proximal,Lateral Lower Leg: Foam Border Off-Loading: Multipodus Splint to: - both feet 1. The patient has an MRI of the left foot on Monday. I suspect this is going to show osteomyelitis in the lateral left fifth met head. He had previous osteomyelitis in this area  documented by bone. He received a month worth of vancomycin in the area closed only to reopen. I suspect he will he need another prolonged course of antibiotics. 2. Furthermore I think the right second toe is likely to be nonsalvageable. 3. He has a new area on the left fibular metatarsal head this is probably a pressure ulcer. The base of it is nonviable I did not debride this today but we will use Santyl. 4. Deep tissue injury on the right lateral fifth metatarsal head we will foam and pad this. 5. I think the patient is in danger of losing his foot on the left. Furthermore the refusals of appointments here make it difficult for him to be followed. I wonder about his ability to make these decisions i.e. competency. In any case after the MRI he is likely going to require additional cultures and a prolonged course of IV antibiotics on that basis. He is currently finishing up with his Bactrim 6. Fortunately I do not think he has a severe arterial issue. With a lot of difficulty earlier in  the year we were able to finally get arterial studies done at [facility]. I spent 30 minutes in review of this patient's past medical history, face-to-face evaluation and preparation of this record Electronic Signature(s) Signed: 11/23/2019 5:29:29 PM By: Linton Ham MD Entered By: Linton Ham on 11/23/2019 11:24:52 -------------------------------------------------------------------------------- SuperBill Details Patient Name: Date of Service: [name], [name] EN D. 11/23/2019 Medical Record Number: [id number] Patient Account Number: [id number] Date of Birth/Sex: Treating RN: 11-10-1960 (59 y.o. 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, left ankle and foot E11.51 Type 2 diabetes mellitus with diabetic peripheral angiopathy without gangrene L97.521 Non-pressure chronic ulcer of other part of left foot limited to breakdown of skin L97.828 Non-pressure chronic ulcer of other part of left lower leg with other specified severity Facility Procedures CPT4 Code: [id number] Description: [phone] - DEBRIDE W/O ANES NON SELECT Modifier: Quantity: 1 Physician Procedures : CPT4 Code Description Modifier [id number] - WC PHYS LEVEL 4 - EST PT ICD-10 Diagnosis Description E11.621 Type 2 diabetes mellitus with foot ulcer L97.524 Non-pressure chronic ulcer of other part of left foot with necrosis of bone M86.472 Chronic  osteomyelitis with draining sinus, left ankle and foot L97.828 Non-pressure chronic ulcer of other part of left lower leg with other specified severity Quantity: 1 Electronic Signature(s) Signed: 11/23/2019 5:02:14 PM By: Levan Hurst RN, BSN Signed: 11/23/2019 5:29:29 PM By: Linton Ham MD Entered By: Levan Hurst on 11/23/2019 13:43:51

## 2019-11-27 ENCOUNTER — Ambulatory Visit (HOSPITAL_COMMUNITY)
Admission: RE | Admit: 2019-11-27 | Discharge: 2019-11-27 | Disposition: A | Discharge status: Home / Self Care | Payer: Medicare Other | Source: Ambulatory Visit | Attending: Physician Assistant | Admitting: Physician Assistant

## 2019-11-27 ENCOUNTER — Other Ambulatory Visit: Payer: Self-pay

## 2019-11-27 DIAGNOSIS — E08621 Diabetes mellitus due to underlying condition with foot ulcer: Secondary | ICD-10-CM | POA: Insufficient documentation

## 2019-11-27 DIAGNOSIS — L97529 Non-pressure chronic ulcer of other part of left foot with unspecified severity: Secondary | ICD-10-CM | POA: Diagnosis present

## 2019-11-27 MED ORDER — GADOBUTROL 1 MMOL/ML IV SOLN
8.0000 mL | Freq: Once | INTRAVENOUS | Status: AC | PRN
  Administered 2019-11-27: 8 mL via INTRAVENOUS

## 2019-12-07 ENCOUNTER — Other Ambulatory Visit: Payer: Self-pay

## 2019-12-07 ENCOUNTER — Encounter (HOSPITAL_BASED_OUTPATIENT_CLINIC_OR_DEPARTMENT_OTHER): Payer: Medicare Other | Attending: Internal Medicine | Admitting: Physician Assistant

## 2019-12-07 DIAGNOSIS — E1140 Type 2 diabetes mellitus with diabetic neuropathy, unspecified: Secondary | ICD-10-CM | POA: Insufficient documentation

## 2019-12-07 DIAGNOSIS — L97521 Non-pressure chronic ulcer of other part of left foot limited to breakdown of skin: Secondary | ICD-10-CM | POA: Diagnosis not present

## 2019-12-07 DIAGNOSIS — E1151 Type 2 diabetes mellitus with diabetic peripheral angiopathy without gangrene: Secondary | ICD-10-CM | POA: Insufficient documentation

## 2019-12-07 DIAGNOSIS — I69151 Hemiplegia and hemiparesis following nontraumatic intracerebral hemorrhage affecting right dominant side: Secondary | ICD-10-CM | POA: Insufficient documentation

## 2019-12-07 DIAGNOSIS — I10 Essential (primary) hypertension: Secondary | ICD-10-CM | POA: Insufficient documentation

## 2019-12-07 DIAGNOSIS — E11621 Type 2 diabetes mellitus with foot ulcer: Secondary | ICD-10-CM | POA: Diagnosis not present

## 2019-12-07 DIAGNOSIS — E1169 Type 2 diabetes mellitus with other specified complication: Secondary | ICD-10-CM | POA: Insufficient documentation

## 2019-12-07 DIAGNOSIS — L97828 Non-pressure chronic ulcer of other part of left lower leg with other specified severity: Secondary | ICD-10-CM | POA: Diagnosis not present

## 2019-12-07 DIAGNOSIS — M86472 Chronic osteomyelitis with draining sinus, left ankle and foot: Secondary | ICD-10-CM | POA: Insufficient documentation

## 2019-12-07 DIAGNOSIS — L97524 Non-pressure chronic ulcer of other part of left foot with necrosis of bone: Secondary | ICD-10-CM | POA: Insufficient documentation

## 2019-12-07 NOTE — Progress Notes (Addendum)
DEONTRE, ALLSUP ([id number]) Visit Report for 12/07/2019 Chief Complaint Document Details Patient Name: Date of Service: [name]  D. 12/07/2019 1:15 PM Medical Record Number: [id number] Patient Account Number: [id number] Date of Birth/Sex: Treating RN: [birth year] (59 y.o. Elizebeth Koller Primary Care Provider: SYSTEM, PCP Other Clinician: Referring Provider: Treating Provider/Extender: [place] Bing in Treatment: 28 Information Obtained from: Patient Chief Complaint 05/19/2019; patient is here for review of wounds on his right dorsal second toe and his left lateral foot Electronic Signature(s) Signed: 12/07/2019 1:17:56 PM By: Lenda Kelp PA-C Entered By: Lenda Kelp on 12/07/2019 13:17:56 -------------------------------------------------------------------------------- HPI Details Patient Name: Date of Service: [name], [name] D. 12/07/2019 1:15 PM Medical Record Number: [id number] Patient Account Number: [id number] Date of Birth/Sex: Treating RN: 11-19-1960 (59 y.o. Elizebeth Koller Primary Care Provider: SYSTEM, PCP Other Clinician: Referring Provider: Treating Provider/Extender: [place] Bing in Treatment: 28 History of Present Illness HPI Description: ADMISSION 05/19/2019 [name] is now a 59 year old man with type 2 diabetes. He was actually in this clinic in 2006 in 2008. I do not have these records. More recently I actually looked after him in primary care with [facility] up until 2016. Notable for the fact that I treated him for osteomyelitis in the right foot for an MRSA infection in the facility in 2014 or thereabouts. Gave him a prolonged course of vancomycin in the facility. My notes at the time do not list him has having significant PAD. He has severe diabetic neuropathy. The patient apparently has had wounds on his feet since November. This includes the right second toe and the left foot at the mid part of  the fifth metatarsal. They are using Santyl on the left foot I am not clear what they are using to the right toe. I do not know if there is been any imaging studies done. It does not appear that his vascular status is been rechecked. 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 The patient is on a combination of Augmentin and Cipro both for 2 weeks. The patient had his arterial studies done at [facility] yesterday I will need to research these results. We are using silver alginate on the left lateral foot and on the right second toe 2/12; he should be completing his antibiotics Augmentin and Cipro. Unfortunately had his arterial studies done at radiology at [facility] they did not do right ABI or left ABIs or TBI's for that matter even though all of these were ordered. Nevertheless on the right there was triphasic waveforms of the common femoral artery, profunda femoris SFA, popliteal artery. Triphasic posterior tibial and anterior tibial as well as peroneal arteries. On the left there was a definite drop off of the distal SFA and popliteal were monophasic. Comment made that they may need to correlate with an ABI on the left even though this was ordered. We will call up and see what the problem was there. He has wounds on the left lateral fifth metatarsal head and the right second toe. Paradoxically both of the wounds look somewhat better 2/26; patient had his arterial studies yesterday at [facility]. These showed an ABI of 0.97 at the dorsalis pedis on the right with a TBI of 0.54. Biphasic waveforms.  On the left ABI at 0.94 at the dorsalis pedis TBI at 0.43. The area on the left lateral foot appears to be better. Right second toe was closed. At discharge he was noted to have wounds on the left medial fifth and the left lateral fourth and the webspace. Both toes were tender and erythematous 3/12. The area on the left lateral foot appears to be contracting. He has areas on the left medial third and the lateral fourth. These may be friction areas from the toes pressing against each other. X-rays of the left foot did not comment osteomyelitis done at the facility 3/26 2-week follow-up. The patient arrives today with the area that we have been following on the left lateral foot superficial and looking better. However last time he was here he had new open areas on the left medial third and lateral fourth toes. The lateral fourth toe was done well however the medial third toe has exposed bone. The toe itself is erythemtous and warm 4/30; the patient's left third toe and fourth toe are both closed. The area on the left lateral foot still has some probing depth. Weeping tissue no overt infection does not probe to bone. We have been using silver alginate on all these areas The patient has what looks to be a deep tissue injury on the medial part of the left first metatarsal head there is no open area here however 5/21; the patient's problematic area on the left lateral foot is still open but with some improvement. He has new open areas on the lateral part of the left second toe and the DTI injury over the metatarsal head is open today with a very superficial wound 6/4; the patient's original area on the left lateral foot appears to have healed. He does have what appears to be a denuded old blister just distal to this on the base of the left fifth metatarsal. There is nothing open here. There is nothing open on the left first metatarsal head lateral aspect if we are still having problems and concerns about 2  weeks ago. The only open area is on the lateral part of the left second toe 10/25/2019 upon evaluation today patient unfortunately presents with worsening of his second toe ulcer on the left. This actually is opened up, appears more erythematous, and has bone noted at the joint where there is no longer maintains alignment. When I attempted to look between the toes the distal portion of the toe actually slipped medially along the joint line. Again I think that the lateral connective tissue has deteriorated at this site. I am concerned about more extensive osteomyelitis at this point. 7/22; this is a patient I have not seen in about 7 weeks although I note he was seen about a month ago. There have been a number of appointment misses because of patient refusals. As I understand things from our intake nurse when he came in the last time the left lateral foot had reopened. He has a worsening area on the lateral part of the left second toe. An MRI is been booked for Monday. He is still on the Bactrim DS that was prescribed last time and started on July 9. He should just about be finished that now. He comes in today with a necrotic area over the left lateral fifth metatarsal head, a deep wound on the left lateral  second toe. He also has a new wound over the fibular head on the left. [place] this was new but it did have a bandage on it. Perhaps a deep tissue injury over the right fifth metatarsal head laterally. We o have been using Santyl on the left lateral foot silver alginate on the right second toe 12/07/2019 upon evaluation today patient appears to be doing about the same in regard to his wounds in general. He has been tolerating the dressing changes without complication. Fortunately there is no signs of active infection which is great news. No fevers, chills, nausea, vomiting, or diarrhea. I did review his MRI as well it does show that he has septic arthritis at the toe and subsequently he is also having  issues at this point with osteomyelitis as well as osteomyelitis in the lateral portion of his foot in the metatarsal region. Electronic Signature(s) Signed: 12/07/2019 2:03:55 PM By: Lenda Kelp PA-C Entered By: Lenda Kelp on 12/07/2019 14:03:55 -------------------------------------------------------------------------------- Physical Exam Details Patient Name: Date of Service: [name] [place] D. 12/07/2019 1:15 PM Medical Record Number: [id number] Patient Account Number: [id number] Date of Birth/Sex: Treating RN: 11-20-1960 (59 y.o. Elizebeth Koller Primary Care Provider: SYSTEM, PCP Other Clinician: Referring Provider: Treating Provider/Extender: [place] Bing in Treatment: 28 Constitutional Well-nourished and well-hydrated in no acute distress. Respiratory normal breathing without difficulty. Psychiatric this patient is able to make decisions and demonstrates good insight into disease process. Alert and Oriented x 3. pleasant and cooperative. Notes Upon inspection patient's wounds currently appear to be about the same there is really no significant improvement unfortunately. There is no signs of active infection either at this time which is also good news systemically though locally he does seem to have some evidence of infection at this point. Electronic Signature(s) Signed: 12/07/2019 2:04:45 PM By: Lenda Kelp PA-C Entered By: Lenda Kelp on 12/07/2019 14:04:44 -------------------------------------------------------------------------------- Physician Orders Details Patient Name: Date of Service: [name], [name] D. 12/07/2019 1:15 PM Medical Record Number: [id number] Patient Account Number: [id number] Date of Birth/Sex: Treating RN: 08-21-1960 (59 y.o. Elizebeth Koller Primary Care Provider: SYSTEM, PCP Other Clinician: Referring Provider: Treating Provider/Extender: [place] Bing in Treatment: 28 Verbal / Phone  Orders: No Diagnosis Coding ICD-10 Coding Code Description E11.621 Type 2 diabetes mellitus with foot ulcer L97.524 Non-pressure chronic ulcer of other part of left foot with necrosis of bone M86.472 Chronic osteomyelitis with draining sinus, left ankle and foot E11.51 Type 2 diabetes mellitus with diabetic peripheral angiopathy without gangrene L97.521 Non-pressure chronic ulcer of other part of left foot limited to breakdown of skin L97.828 Non-pressure chronic ulcer of other part of left lower leg with other specified severity Follow-up Appointments Return Appointment in 2 weeks. Dressing Change Frequency Change dressing every day. - all wounds Wound Cleansing Clean wound with Wound Cleanser - or normal saline Primary Wound Dressing Foam - on right lateral foot for protection Wound #3R Left,Lateral Foot Santyl Ointment Wound #7 Left T Second oe Calcium Alginate with Silver Wound #8 Left,Proximal,Lateral Lower Leg Santyl Ointment Secondary Dressing Wound #3R Left,Lateral Foot Foam - foam donut Kerlix/Rolled Gauze Dry Gauze Wound #7 Left T Second oe Kerlix/Rolled Gauze Dry Gauze Wound #8 Left,Proximal,Lateral Lower Leg Foam Border Off-Loading Multipodus Splint to: - both [employer] Dr. Victorino Dike ([facility]) - Osteomyelitis and septic arthritis in left 2nd toe and 5th metatarsal, non healing ulcers - (ICD10 E11.621 - Type 2 diabetes mellitus  with foot ulcer) Electronic Signature(s) Signed: 12/07/2019 5:21:32 PM By: Zandra AbtsLynch, Shatara RN, BSN Signed: 12/07/2019 6:02:08 PM By: Lenda KelpStone III, Midge Momon PA-C Entered By: Zandra AbtsLynch, Shatara on 12/07/2019 14:02:23 Prescription 12/07/2019 -------------------------------------------------------------------------------- Barrington EllisonJOYCE, Frank D. Stone III, Delara Shepheard PA Patient Name: Provider: 04/16/1961 [id number] Date of Birth: NPI#: Judie PetitM [id number] Sex: DEA #: [phone] Phone #: License #: [facility] Patient  [facility]: [facility] [address] [place], [place] [address] [place] [address] [phone] Allergies No Known Allergies Provider's Orders Dr. Victorino DikeHewitt ([facility]) - ICD10: E11.621 - Osteomyelitis and septic arthritis in left 2nd toe and 5th metatarsal, non healing ulcers Hand Signature: Date(s): Electronic Signature(s) Signed: 12/07/2019 5:21:32 PM By: Zandra AbtsLynch, Shatara RN, BSN Signed: 12/07/2019 6:02:08 PM By: Lenda KelpStone III, Eliza Green PA-C Entered By: Zandra AbtsLynch, Shatara on 12/07/2019 14:02:24 -------------------------------------------------------------------------------- Problem List Details Patient Name: Date of Service: [name], Frank SavoySTEV [name] D. 12/07/2019 1:15 PM Medical Record Number: [id number] Patient Account Number: [id number] Date of Birth/Sex: Treating RN: 04/16/1961 (59 y.o. Elizebeth KollerM) Lynch, Shatara Primary Care Provider: SYSTEM, PCP Other Clinician: Referring Provider: Treating Provider/Extender: [place] BingStone III, Bert Ptacek Moore, Donald Weeks in Treatment: 28 Active Problems ICD-10 Encounter Code Description Active Date MDM Diagnosis E11.621 Type 2 diabetes mellitus with foot ulcer 05/19/2019 No Yes L97.524 Non-pressure chronic ulcer of other part of left foot with necrosis of bone 05/19/2019 No Yes M86.472 Chronic osteomyelitis with draining sinus, left ankle and foot 08/11/2019 No Yes E11.51 Type 2 diabetes mellitus with diabetic peripheral angiopathy without gangrene 05/19/2019 No Yes L97.521 Non-pressure chronic ulcer of other part of left foot limited to breakdown of 06/30/2019 No Yes skin L97.828 Non-pressure chronic ulcer of other part of left lower leg with other specified 11/23/2019 No Yes severity Inactive Problems ICD-10 Code Description Active Date Inactive Date L97.514 Non-pressure chronic ulcer of other part of right foot with necrosis of bone 05/19/2019 05/19/2019 Resolved Problems Electronic Signature(s) Signed: 12/07/2019 1:17:45 PM By:  Lenda KelpStone III, Maria Coin PA-C Entered By: Lenda KelpStone III, Rylon Poitra on 12/07/2019 13:17:44 -------------------------------------------------------------------------------- Progress Note Details Patient Name: Date of Service: [name], Frank SavoySTEV [name] D. 12/07/2019 1:15 PM Medical Record Number: [id number] Patient Account Number: [id number] Date of Birth/Sex: Treating RN: 04/16/1961 (59 y.o. Elizebeth KollerM) Lynch, Shatara Primary Care Provider: SYSTEM, PCP Other Clinician: Referring Provider: Treating Provider/Extender: [place] BingStone III, Savas Elvin Moore, Donald Weeks in Treatment: 28 Subjective Chief Complaint Information obtained from Patient 05/19/2019; patient is here for review of wounds on his right dorsal second toe and his left lateral foot History of Present Illness (HPI) ADMISSION 05/19/2019 [name] is now a 59 year old man with type 2 diabetes. He was actually in this clinic in 2006 in 2008. I do not have these records. More recently I actually looked after him in primary care [facility] up until 2016. Notable for the fact that I treated him for osteomyelitis in the right foot for an MRSA infection in the facility in 2014 or thereabouts. Gave him a prolonged course of vancomycin in the facility. My notes at the time do not list him has having significant PAD. He has severe diabetic neuropathy. The patient apparently has had wounds on his feet since November. This includes the right second toe and the left foot at the mid part of the fifth metatarsal. They are using Santyl on the left foot I am not clear what they are using to the right toe. I do not know if there is been any imaging studies  done. It does not appear that his vascular status is been rechecked. 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 The patient is on a combination of Augmentin and Cipro both for 2 weeks. The patient had his arterial studies done at [facility] yesterday I will need to research these results. We are using silver alginate on the left lateral foot and on the right second toe 2/12; he should be completing his antibiotics Augmentin and Cipro. Unfortunately had his arterial studies done at radiology at [facility] they did not do right ABI or left ABIs or TBI's for that matter even though all of these were ordered. Nevertheless on the right there was triphasic waveforms of the common femoral artery, profunda femoris SFA, popliteal artery. Triphasic posterior tibial and anterior tibial as well as peroneal arteries. On the left there was a definite drop off of the distal SFA and popliteal were monophasic. Comment made that they may need to correlate with an ABI on the left even though this was ordered. We will call up and see what the problem was there. He has wounds on the left lateral fifth metatarsal head and the right second toe. Paradoxically both of the wounds look somewhat better 2/26; patient had his arterial studies yesterday at [facility]. These showed an ABI of 0.97 at the dorsalis pedis on the right with a TBI of 0.54. Biphasic waveforms. On the left ABI at 0.94 at the dorsalis pedis TBI at 0.43. The area on the left lateral foot appears to be better. Right second toe was closed. At discharge he was noted to have wounds on the left medial fifth and the left lateral fourth and the webspace. Both toes were tender and erythematous 3/12. The area on the left lateral foot appears to be contracting. He has areas on the left medial third and the lateral fourth. These may be friction areas from the toes pressing against each other.  X-rays of the left foot did not comment osteomyelitis done at the facility 3/26 2-week follow-up. The patient arrives today with the area that we have been following on the left lateral foot superficial and looking better. However last time he was here he had new open areas on the left medial third and lateral fourth toes. The lateral fourth toe was done well however the medial third toe has exposed bone. The toe itself is erythemtous and warm 4/30; the patient's left third toe and fourth toe are both closed. The area on the left lateral foot still has some probing depth. Weeping tissue no overt infection does not probe to bone. We have been using silver alginate on all these areas The patient has what looks to be a deep tissue injury on the medial part of the left first metatarsal head there is no open area here however 5/21; the patient's problematic area on the left lateral foot is still open but with some improvement. He has new open areas on the lateral part of the left second toe and the DTI injury over the metatarsal head is open today with a very superficial wound 6/4; the patient's original area on the left lateral foot appears to have healed. He does  have what appears to be a denuded old blister just distal to this on the base of the left fifth metatarsal. There is nothing open here. There is nothing open on the left first metatarsal head lateral aspect if we are still having problems and concerns about 2 weeks ago. The only open area is on the lateral part of the left second toe 10/25/2019 upon evaluation today patient unfortunately presents with worsening of his second toe ulcer on the left. This actually is opened up, appears more erythematous, and has bone noted at the joint where there is no longer maintains alignment. When I attempted to look between the toes the distal portion of the toe actually slipped medially along the joint line. Again I think that the lateral connective tissue has  deteriorated at this site. I am concerned about more extensive osteomyelitis at this point. 7/22; this is a patient I have not seen in about 7 weeks although I note he was seen about a month ago. There have been a number of appointment misses because of patient refusals. As I understand things from our intake nurse when he came in the last time the left lateral foot had reopened. He has a worsening area on the lateral part of the left second toe. An MRI is been booked for Monday. He is still on the Bactrim DS that was prescribed last time and started on July 9. He should just about be finished that now. He comes in today with a necrotic area over the left lateral fifth metatarsal head, a deep wound on the left lateral second toe. He also has a new wound over the fibular head on the left. [place] this was new but it did have a bandage on it. Perhaps a deep tissue injury over the right fifth metatarsal head laterally. We o have been using Santyl on the left lateral foot silver alginate on the right second toe 12/07/2019 upon evaluation today patient appears to be doing about the same in regard to his wounds in general. He has been tolerating the dressing changes without complication. Fortunately there is no signs of active infection which is great news. No fevers, chills, nausea, vomiting, or diarrhea. I did review his MRI as well it does show that he has septic arthritis at the toe and subsequently he is also having issues at this point with osteomyelitis as well as osteomyelitis in the lateral portion of his foot in the metatarsal region. Objective Constitutional Well-nourished and well-hydrated in no acute distress. Vitals Time Taken: 1:36 PM, Height: 73 in, Weight: 207 lbs, BMI: 27.3, Temperature: 98.6 F, Pulse: 86 bpm, Respiratory Rate: 18 breaths/min, Blood Pressure: 117/77 mmHg. Respiratory normal breathing without difficulty. Psychiatric this patient is able to make decisions and  demonstrates good insight into disease process. Alert and Oriented x 3. pleasant and cooperative. General Notes: Upon inspection patient's wounds currently appear to be about the same there is really no significant improvement unfortunately. There is no signs of active infection either at this time which is also good news systemically though locally he does seem to have some evidence of infection at this point. Integumentary (Hair, Skin) Wound #3R status is Open. Original cause of wound was Gradually Appeared. The wound is located on the Left,Lateral Foot. The wound measures 3.5cm length x 2cm width x 0.2cm depth; 5.498cm^2 area and 1.1cm^3 volume. There is Fat Layer (Subcutaneous Tissue) Exposed exposed. There is no tunneling or undermining noted. There is a medium amount of serosanguineous drainage  noted. The wound margin is distinct with the outline attached to the wound base. There is no granulation within the wound bed. There is a large (67-100%) amount of necrotic tissue within the wound bed including Adherent Slough. Wound #7 status is Open. Original cause of wound was Gradually Appeared. The wound is located on the Left T Second. The wound measures 0.7cm length x oe 1cm width x 0.1cm depth; 0.55cm^2 area and 0.055cm^3 volume. There is Fat Layer (Subcutaneous Tissue) Exposed exposed. There is no tunneling or undermining noted. There is a small amount of serosanguineous drainage noted. The wound margin is flat and intact. There is medium (34-66%) red granulation within the wound bed. There is a medium (34-66%) amount of necrotic tissue within the wound bed including Adherent Slough. Wound #8 status is Open. Original cause of wound was Pressure Injury. The wound is located on the Left,Proximal,Lateral Lower Leg. The wound measures 1.8cm length x 1cm width x 0.1cm depth; 1.414cm^2 area and 0.141cm^3 volume. There is Fat Layer (Subcutaneous Tissue) Exposed exposed. There is no tunneling or  undermining noted. There is a medium amount of serosanguineous drainage noted. The wound margin is flat and intact. There is large (67-100%) pink granulation within the wound bed. There is a small (1-33%) amount of necrotic tissue within the wound bed including Adherent Slough. 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 Dressing Change Frequency: Change dressing every day. - all wounds Wound Cleansing: Clean wound with Wound Cleanser - or normal saline Primary Wound Dressing: Foam - on right lateral foot for protection Wound #3R Left,Lateral Foot: Santyl Ointment Wound #7 Left T Second: oe Calcium Alginate with Silver Wound #8 Left,Proximal,Lateral Lower Leg: Santyl Ointment Secondary Dressing: Wound #3R Left,Lateral Foot: Foam - foam donut Kerlix/Rolled Gauze Dry Gauze Wound #7 Left T Second: oe Kerlix/Rolled Gauze Dry Gauze Wound #8 Left,Proximal,Lateral Lower Leg: Foam Border Off-Loading: Multipodus Splint to: - both feet ordered were: Dr. 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 I would recommend currently that we go ahead and continue with the wound care measures as before specifically with regard to the [facility] for his lateral foot and leg ulcer and subsequently we will also use the alginate for the toe ulcer which I think is still the best way to go. 2. I am also can recommend that the patient continue with appropriate  offloading utilizing his Prevalon offloading boots which I do think is beneficial for him as well. He needs to be rotated and reposition rather frequently. 3. I am also can I suggest a referral to Dr. Victorino Dike for consideration of a left below-knee amputation. I discussed this with the patient today and he would like to at least talk to the surgeon. I explained that we can definitely continue with wound care measures in the interim but again I am unsure whether or not were really good to see the improvement and complete resolution that were looking for at this point. We will see patient back for reevaluation in 2 weeks here in the  clinic. If anything worsens or changes patient will contact our office for additional recommendations. Electronic Signature(s) Signed: 12/07/2019 2:05:51 PM By: Lenda Kelp PA-C Entered By: Lenda Kelp on 12/07/2019 14:05:50 -------------------------------------------------------------------------------- SuperBill Details Patient Name: Date of Service: [name], [name] D. 12/07/2019 Medical Record Number: [id number] Patient Account Number: [id number] Date of Birth/Sex: Treating RN: [birth year] (59 y.o. 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, left ankle and foot E11.51 Type 2 diabetes mellitus with diabetic peripheral angiopathy without gangrene L97.521 Non-pressure chronic ulcer of other part of left foot limited to breakdown of skin L97.828 Non-pressure chronic ulcer of other part of left lower leg with other specified severity Facility Procedures CPT4 Code: [id number] Description: 99214 - WOUND CARE  VISIT-LEV 4 EST PT Modifier: Quantity: 1 Physician Procedures : CPT4 Code Description Modifier [id number] 99214 - WC PHYS LEVEL 4 - EST PT ICD-10 Diagnosis Description E11.621 Type 2 diabetes mellitus with foot ulcer L97.524 Non-pressure chronic ulcer of other part of left foot with necrosis of bone M86.472 Chronic  osteomyelitis with draining sinus, left ankle and foot E11.51 Type 2 diabetes mellitus with diabetic peripheral angiopathy without gangrene Quantity: 1 Electronic Signature(s) Signed: 12/07/2019 5:21:32 PM By: Zandra Abts RN, BSN Signed: 12/07/2019 6:02:08 PM By: Lenda Kelp PA-C Previous Signature: 12/07/2019 2:06:48 PM Version By: Lenda Kelp PA-C Entered By: Zandra Abts on 12/07/2019 17:04:01

## 2019-12-11 NOTE — Progress Notes (Signed)
SYAIR, FRICKER ([id number]) Visit Report for 12/07/2019 Arrival Information Details Patient Name: Date of Service: [name] [place] D. 12/07/2019 1:15 PM Medical Record Number: [id number] Patient Account Number: [id number] Date of Birth/Sex: Treating RN: [birth year] (59 y.o. Jerilynn Mages) Carlene Coria Primary Care Cline Draheim: SYSTEM, PCP Other Clinician: Referring Braylen Staller: Treating Dreonna Hussein/Extender: [name] in Treatment: 29 Visit Information History Since Last Visit All ordered tests and consults were completed: No Patient Arrived: Wheel Chair Added or deleted any medications: No Arrival Time: 13:22 Any new allergies or adverse reactions: No Accompanied By: self Had a fall or experienced change in No Transfer Assistance: None activities of daily living that may affect Patient Identification Verified: Yes risk of falls: Secondary Verification Process Completed: Yes Signs or symptoms of abuse/neglect since last visito No Patient Requires Transmission-Based Precautions: No Hospitalized since last visit: No Patient Has Alerts: Yes Implantable device outside of the clinic excluding No Patient Alerts: R and L ABIs N/C cellular tissue based products placed in the center since last visit: Has Dressing in Place as Prescribed: Yes Pain Present Now: No Electronic Signature(s) Signed: 12/08/2019 4:42:20 PM By: Carlene Coria RN Entered By: Carlene Coria on 12/07/2019 13:36:06 -------------------------------------------------------------------------------- Clinic Level of Care Assessment Details Patient Name: Date of Service: [name] [place] D. 12/07/2019 1:15 PM Medical Record Number: [id number] Patient Account Number: [id number] Date of Birth/Sex: Treating RN: May 03, 1961 (59 y.o. [name] Primary Care Hopelynn Gartland: SYSTEM, PCP Other Clinician: Referring Shery [place]: Treating Immaculate Crutcher/Extender: [name] in Treatment: 28 Clinic Level of Care  Assessment Items TOOL 4 Quantity Score X- 1 0 Use when only an EandM is performed on FOLLOW-UP visit ASSESSMENTS - Nursing Assessment / Reassessment X- 1 10 Reassessment of Co-morbidities (includes updates in patient status) X- 1 5 Reassessment of Adherence to Treatment Plan ASSESSMENTS - Wound and Skin A ssessment / Reassessment []  - 0 Simple Wound Assessment / Reassessment - one wound X- 3 5 Complex Wound Assessment / Reassessment - multiple wounds []  - 0 Dermatologic / Skin Assessment (not related to wound area) ASSESSMENTS - Focused Assessment []  - 0 Circumferential Edema Measurements - multi extremities []  - 0 Nutritional Assessment / Counseling / Intervention X- 1 5 Lower Extremity Assessment (monofilament, tuning fork, pulses) []  - 0 Peripheral Arterial Disease Assessment (using hand held doppler) ASSESSMENTS - Ostomy and/or Continence Assessment and Care []  - 0 Incontinence Assessment and Management []  - 0 Ostomy Care Assessment and Management (repouching, etc.) PROCESS - Coordination of Care X - Simple Patient / Family Education for ongoing care 1 15 []  - 0 Complex (extensive) Patient / Family Education for ongoing care X- 1 10 Staff obtains Programmer, systems, Records, T Results / Process Orders est X- 1 10 Staff telephones HHA, Nursing Homes / Clarify orders / etc []  - 0 Routine Transfer to another Facility (non-emergent condition) []  - 0 Routine Hospital Admission (non-emergent condition) []  - 0 New Admissions / Biomedical engineer / Ordering NPWT Apligraf, etc. , []  - 0 Emergency Hospital Admission (emergent condition) X- 1 10 Simple Discharge Coordination []  - 0 Complex (extensive) Discharge Coordination PROCESS - Special Needs []  - 0 Pediatric / Minor Patient Management []  - 0 Isolation Patient Management []  - 0 Hearing / Language / Visual special needs []  - 0 Assessment of Community assistance (transportation, D/C planning, etc.) []  -  0 Additional assistance / Altered mentation []  - 0 Support Surface(s) Assessment (bed, cushion, seat, etc.) INTERVENTIONS - Wound Cleansing / Measurement []  -  0 Simple Wound Cleansing - one wound X- 3 5 Complex Wound Cleansing - multiple wounds X- 1 5 Wound Imaging (photographs - any number of wounds) []  - 0 Wound Tracing (instead of photographs) []  - 0 Simple Wound Measurement - one wound X- 3 5 Complex Wound Measurement - multiple wounds INTERVENTIONS - Wound Dressings X - Small Wound Dressing one or multiple wounds 1 10 X- 1 15 Medium Wound Dressing one or multiple wounds []  - 0 Large Wound Dressing one or multiple wounds X- 1 5 Application of Medications - topical []  - 0 Application of Medications - injection INTERVENTIONS - Miscellaneous []  - 0 External ear exam []  - 0 Specimen Collection (cultures, biopsies, blood, body fluids, etc.) []  - 0 Specimen(s) / Culture(s) sent or taken to Lab for analysis []  - 0 Patient Transfer (multiple staff / Civil Service fast streamer / Similar devices) []  - 0 Simple Staple / Suture removal (25 or less) []  - 0 Complex Staple / Suture removal (26 or more) []  - 0 Hypo / Hyperglycemic Management (close monitor of Blood Glucose) []  - 0 Ankle / Brachial Index (ABI) - do not check if billed separately X- 1 5 Vital Signs Has the patient been seen at the hospital within the last three years: Yes Total Score: 150 Level Of Care: New/Established - Level 4 Electronic Signature(s) Signed: 12/07/2019 5:21:32 PM By: Levan Hurst RN, BSN Entered By: Levan Hurst on 12/07/2019 17:03:50 -------------------------------------------------------------------------------- Complex / Palliative Patient Assessment Details Patient Name: Date of Service: [name], Frank Kennedy EN D. 12/07/2019 1:15 PM Medical Record Number: [id number] Patient Account Number: [id number] Date of Birth/Sex: Treating RN: Jan 07, 1961 (59 y.o. [name] Primary Care Mose Colaizzi: SYSTEM, PCP  Other Clinician: Referring Skippy Marhe[place]: Treating Zhana Jeangilles/Extender: [name] in Treatment: 28 Palliative Management Criteria Complex Wound Management Criteria Patient has remarkable or complex co-morbidities requiring medications or treatments that extend wound healing times. Examples: Diabetes mellitus with chronic renal failure or end stage renal disease requiring dialysis Advanced or poorly controlled rheumatoid arthritis Diabetes mellitus and end stage chronic obstructive pulmonary disease Active cancer with current chemo- or radiation therapy Type 2 Diabetes, Osteomyelitis Care Approach Wound Care Plan: Complex Wound Management Electronic Signature(s) Signed: 12/11/2019 2:22:24 PM By: Worthy Keeler PA-C Signed: 12/11/2019 5:19:36 PM By: Levan Hurst RN, BSN Entered By: Levan Hurst on 12/08/2019 11:14:44 -------------------------------------------------------------------------------- Encounter Discharge Information Details Patient Name: Date of Service: [name], Frank Kennedy EN D. 12/07/2019 1:15 PM Medical Record Number: [id number] Patient Account Number: [id number] Date of Birth/Sex: Treating RN: Nov 04, 1960 (59 y.o. [name] Primary Care Jamesetta Greenhalgh: SYSTEM, PCP Other Clinician: Referring Isabellamarie Randa: Treating Trae Bovenzi/Extender: [name] in Treatment: 28 Encounter Discharge Information Items Discharge Condition: Stable Ambulatory Status: Wheelchair Discharge Destination: [place] Telephoned: No Orders Sent: Yes Transportation: Other Accompanied By: facility staff Schedule Follow-up Appointment: Yes Clinical Summary of Care: Patient Declined Notes transportation service Electronic Signature(s) Signed: 12/07/2019 5:04:18 PM By: Baruch Gouty RN, BSN Entered By: Baruch Gouty on 12/07/2019 14:31:14 -------------------------------------------------------------------------------- Lower Extremity Assessment  Details Patient Name: Date of Service: [name] EN D. 12/07/2019 1:15 PM Medical Record Number: [id number] Patient Account Number: [id number] Date of Birth/Sex: Treating RN: June 05, 1960 (59 y.o. Oval Linsey Primary Care Yordi Krager: SYSTEM, PCP Other Clinician: Referring Sparrow [place]: Treating Bryen Hinderman/Extender: [name] in Treatment: 28 Edema Assessment Assessed: [Left: No] [Right: No] Edema: [Left: N] [Right: o] Calf Left: Right: Point of Measurement: cm From Medial Instep 29 cm  cm Ankle Left: Right: Point of Measurement: cm From Medial Instep 18 cm cm Electronic Signature(s) Signed: 12/08/2019 4:42:20 PM By: Carlene Coria RN Entered By: Carlene Coria on 12/07/2019 13:36:45 -------------------------------------------------------------------------------- Multi-Disciplinary Care Plan Details Patient Name: Date of Service: [name], Frank Kennedy EN D. 12/07/2019 1:15 PM Medical Record Number: [id number] Patient Account Number: [id number] Date of Birth/Sex: Treating RN: 1960-09-09 (59 y.o. [name] Primary Care Kairyn Olmeda: SYSTEM, PCP Other Clinician: Referring Trevaris Pennella: Treating Kaevon Cotta/Extender: [name] in Treatment: 28 Active Inactive Nutrition Nursing Diagnoses: Imbalanced nutrition Goals: Patient/caregiver will maintain therapeutic glucose control Date Initiated: 05/19/2019 Target Resolution Date: 12/22/2019 Goal Status: Active Interventions: Provide education on elevated blood sugars and impact on wound healing Notes: Wound/Skin Impairment Nursing Diagnoses: Impaired tissue integrity Goals: Patient/caregiver will verbalize understanding of skin care regimen Date Initiated: 11/23/2019 Target Resolution Date: 12/22/2019 Goal Status: Active Ulcer/skin breakdown will have a volume reduction of 50% by week 8 Date Initiated: 05/19/2019 Date Inactivated: 11/23/2019 Target Resolution Date: 11/24/2019 Goal Status:  Met Interventions: Provide education on ulcer and skin care Notes: Electronic Signature(s) Signed: 12/07/2019 5:21:32 PM By: Levan Hurst RN, BSN Entered By: Levan Hurst on 12/07/2019 17:02:30 -------------------------------------------------------------------------------- Pain Assessment Details Patient Name: Date of Service: [name] EN D. 12/07/2019 1:15 PM Medical Record Number: [id number] Patient Account Number: [id number] Date of Birth/Sex: Treating RN: 29-Jan-1961 (59 y.o. Oval Linsey Primary Care Jhane Lorio: SYSTEM, PCP Other Clinician: Referring Sheffield Hawker: Treating Clemmie Buelna/Extender: [name] in Treatment: 28 Active Problems Location of Pain Severity and Description of Pain Patient Has Paino No Site Locations Pain Management and Medication Current Pain Management: Electronic Signature(s) Signed: 12/08/2019 4:42:20 PM By: Carlene Coria RN Entered By: Carlene Coria on 12/07/2019 13:36:39 -------------------------------------------------------------------------------- Patient/Caregiver Education Details Patient Name: Date of Service: [name] EN D. 8/5/2021andnbsp1:15 PM Medical Record Number: [id number] Patient Account Number: [id number] Date of Birth/Gender: Treating RN: 09-Feb-1961 (59 y.o. [name] Primary Care Physician: SYSTEM, PCP Other Clinician: Referring Physician: Treating Physician/Extender: [name] in Treatment: 28 Education Assessment Education Provided To: Patient Education Topics Provided Wound/Skin Impairment: Methods: Explain/Verbal Responses: State content correctly [employer]) Signed: 12/07/2019 5:21:32 PM By: Levan Hurst RN, BSN Entered By: Levan Hurst on 12/07/2019 17:02:43 -------------------------------------------------------------------------------- Wound Assessment Details Patient Name: Date of Service: [name], Frank Kennedy EN D. 12/07/2019 1:15  PM Medical Record Number: [id number] Patient Account Number: [id number] Date of Birth/Sex: Treating RN: 1960/11/02 (59 y.o. Jerilynn Mages) Carlene Coria Primary Care Shana Younge: SYSTEM, PCP Other Clinician: Referring Rozanne Heumann: Treating Elius Etheredge/Extender: [name] in Treatment: 28 Wound Status Wound Number: 3R Primary Diabetic Wound/Ulcer of the Lower Extremity Etiology: Wound Location: Left, Lateral Foot Wound Open Wounding Event: Gradually Appeared Status: Date Acquired: 03/05/2019 Comorbid Glaucoma, Coronary Artery Disease, Deep Vein Thrombosis, Weeks Of Treatment: 28 History: Hypertension, Peripheral Arterial Disease, Type II Diabetes, Clustered Wound: No Dementia, Neuropathy, Seizure Disorder Photos Photo Uploaded By: Mikeal Hawthorne on 12/11/2019 10:39:43 Wound Measurements Length: (cm) 3.5 Width: (cm) 2 Depth: (cm) 0.2 Area: (cm) 5.498 Volume: (cm) 1.1 % Reduction in Area: -87.2% % Reduction in Volume: 79.2% Epithelialization: Small (1-33%) Tunneling: No Undermining: No Wound Description Classification: Grade 2 Wound Margin: Distinct, outline attached Exudate Amount: Medium Exudate Type: Serosanguineous Exudate Color: red, brown Foul Odor After Cleansing: No Slough/Fibrino Yes Wound Bed Granulation Amount: None Present (0%) Exposed Structure Necrotic Amount: Large (67-100%) Fascia Exposed: No Necrotic Quality: Adherent Slough Fat Layer (Subcutaneous Tissue) Exposed: Yes Tendon Exposed:  No Muscle Exposed: No Joint Exposed: No Bone Exposed: No Treatment Notes Wound #3R (Left, Lateral Foot) 3. Primary Dressing Applied Santyl 4. Secondary Dressing Dry Gauze Roll Gauze Foam Electronic Signature(s) Signed: 12/08/2019 4:42:20 PM By: Carlene Coria RN Entered By: Carlene Coria on 12/07/2019 13:38:03 -------------------------------------------------------------------------------- Wound Assessment Details Patient Name: Date of Service: [name] EN D. 12/07/2019 1:15 PM Medical Record Number: [id number] Patient Account Number: [id number] Date of Birth/Sex: Treating RN: [birth year] (59 y.o. Jerilynn Mages) Carlene Coria Primary Care Hughie Melroy: SYSTEM, PCP Other Clinician: Referring Halo Laski: Treating Jame Seelig/Extender: [name] in Treatment: 28 Wound Status Wound Number: 7 Primary Diabetic Wound/Ulcer of the Lower Extremity Etiology: Wound Location: Left T Second oe Wound Open Wounding Event: Gradually Appeared Status: Date Acquired: 09/22/2019 Comorbid Glaucoma, Coronary Artery Disease, Deep Vein Thrombosis, Weeks Of Treatment: 10 History: Hypertension, Peripheral Arterial Disease, Type II Diabetes, Clustered Wound: No Dementia, Neuropathy, Seizure Disorder Photos Photo Uploaded By: Mikeal Hawthorne on 12/11/2019 10:39:44 Wound Measurements Length: (cm) 0.7 Width: (cm) 1 Depth: (cm) 0.1 Area: (cm) 0.55 Volume: (cm) 0.055 % Reduction in Area: -233.3% % Reduction in Volume: -243.7% Epithelialization: Small (1-33%) Tunneling: No Undermining: No Wound Description Classification: Grade 2 Wound Margin: Flat and Intact Exudate Amount: Small Exudate Type: Serosanguineous Exudate Color: red, brown Foul Odor After Cleansing: No Slough/Fibrino Yes Wound Bed Granulation Amount: Medium (34-66%) Exposed Structure Granulation Quality: Red Fascia Exposed: No Necrotic Amount: Medium (34-66%) Fat Layer (Subcutaneous Tissue) Exposed: Yes Necrotic Quality: Adherent Slough Tendon Exposed: No Muscle Exposed: No Joint Exposed: No Bone Exposed: No Treatment Notes Wound #7 (Left Toe Second) 3. Primary Dressing Applied Calcium Alginate Ag 4. Secondary Dressing Dry Gauze Roll Gauze Electronic Signature(s) Signed: 12/08/2019 4:42:20 PM By: Carlene Coria RN Entered By: Carlene Coria on 12/07/2019 13:38:18 -------------------------------------------------------------------------------- Wound Assessment  Details Patient Name: Date of Service: [name] EN D. 12/07/2019 1:15 PM Medical Record Number: [id number] Patient Account Number: [id number] Date of Birth/Sex: Treating RN: April 16, 1961 (59 y.o. Jerilynn Mages) Carlene Coria Primary Care Carmaleta Youngers: SYSTEM, PCP Other Clinician: Referring Eurika Sandy: Treating Dequavious Harshberger/Extender: [name] in Treatment: 28 Wound Status Wound Number: 8 Primary Pressure Ulcer Etiology: Wound Location: Left, Proximal, Lateral Lower Leg Wound Open Wounding Event: Pressure Injury Status: Date Acquired: 11/23/2019 Comorbid Glaucoma, Coronary Artery Disease, Deep Vein Thrombosis, Weeks Of Treatment: 2 Weeks Of Treatment: 2 History: Hypertension, Peripheral Arterial Disease, Type II Diabetes, Clustered Wound: No Dementia, Neuropathy, Seizure Disorder Wound Measurements Length: (cm) 1.8 Width: (cm) 1 Depth: (cm) 0.1 Area: (cm) 1.414 Volume: (cm) 0.141 % Reduction in Area: 47.8% % Reduction in Volume: 48% Epithelialization: Medium (34-66%) Tunneling: No Undermining: No Wound Description Classification: Category/Stage III Wound Margin: Flat and Intact Exudate Amount: Medium Exudate Type: Serosanguineous Exudate Color: red, brown Foul Odor After Cleansing: No Slough/Fibrino Yes Wound Bed Granulation Amount: Large (67-100%) Exposed Structure Granulation Quality: Pink Fascia Exposed: No Necrotic Amount: Small (1-33%) Fat Layer (Subcutaneous Tissue) Exposed: Yes Necrotic Quality: Adherent Slough Tendon Exposed: No Muscle Exposed: No Joint Exposed: No Bone Exposed: No Treatment Notes Wound #8 (Left, Proximal, Lateral Lower Leg) 2. Periwound Care Skin Prep 3. Primary Dressing Applied Santyl 4. Secondary Dressing Foam Border Dressing Electronic Signature(s) Signed: 12/07/2019 5:21:32 PM By: Levan Hurst RN, BSN Signed: 12/08/2019 4:42:20 PM By: Carlene Coria RN Entered By: Levan Hurst on 12/07/2019  13:55:14 -------------------------------------------------------------------------------- Vitals Details Patient Name: Date of Service: [name], Frank Kennedy EN D. 12/07/2019 1:15 PM Medical Record Number: [id number] Patient Account Number: [id number] Date  of Birth/Sex: Treating RN: 1960-07-26 (59 y.o. Jerilynn Mages) Carlene Coria Primary Care Lavaris Sexson: SYSTEM, PCP Other Clinician: Referring Gurtej Noyola: Treating Khrista Braun/Extender: [name] in Treatment: 28 Vital Signs Time Taken: 13:36 Temperature (F): 98.6 Height (in): 73 Pulse (bpm): 86 Weight (lbs): 207 Respiratory Rate (breaths/min): 18 Body Mass Index (BMI): 27.3 Blood Pressure (mmHg): 117/77 Reference Range: 80 - 120 mg / dl Electronic Signature(s) Signed: 12/08/2019 4:42:20 PM By: Carlene Coria RN Entered By: Carlene Coria on 12/07/2019 13:36:31

## 2019-12-21 ENCOUNTER — Encounter (HOSPITAL_BASED_OUTPATIENT_CLINIC_OR_DEPARTMENT_OTHER): Payer: Medicare Other | Admitting: Internal Medicine

## 2020-01-23 NOTE — Progress Notes (Signed)
LEVANDER, KATZENSTEIN ([id number]) Visit Report for 11/23/2019 Arrival Information Details Patient Name: Date of Service: [name] Frank D. 11/23/2019 9:45 A M Medical Record Number: [id number] Patient Account Number: [id number] Date of Birth/Sex: Treating RN: 05/03/1961 (59 y.o. [name] Primary Care [name]: SYSTEM, PCP Other Clinician: Referring [name]: Treating [name]/Extender: [name] in Treatment: 67 Visit Information History Since Last Visit Added or deleted any medications: No Patient Arrived: Wheel Chair Any new allergies or adverse reactions: No Arrival Time: 09:24 Had a fall or experienced change in No Accompanied By: caregiver activities of daily living that may affect Transfer Assistance: None risk of falls: Patient Identification Verified: Yes Signs or symptoms of abuse/neglect since last visito No Secondary Verification Process Completed: Yes Hospitalized since last visit: No Patient Requires Transmission-Based Precautions: No Implantable device outside of the clinic excluding No Patient Has Alerts: Yes cellular tissue based products placed in the center Patient Alerts: R and L ABIs N/C since last visit: Has Dressing in Place as Prescribed: Yes Pain Present Now: No Electronic Signature(s) Signed: 01/23/2020 8:09:04 AM By: [name] Entered By: [name] on 11/23/2019 09:25:19 -------------------------------------------------------------------------------- Encounter Discharge Information Details Patient Name: Date of Service: [name], [name] Frank D. 11/23/2019 9:45 A M Medical Record Number: [id number] Patient Account Number: [id number] Date of Birth/Sex: Treating RN: 17-May-1960 (59 y.o. [name] Primary Care [name]: SYSTEM, PCP Other Clinician: Referring [name]: Treating [name]/Extender: [name] in Treatment: 26 Encounter Discharge Information Items Discharge Condition:  Stable Ambulatory Status: Wheelchair Discharge Destination: [place] Telephoned: No Orders Sent: Yes Transportation: Other Accompanied By: facility staff Schedule Follow-up Appointment: Yes Clinical Summary of Care: Patient Declined Notes transportation service Electronic Signature(s) Signed: 11/23/2019 4:56:58 PM By: Frank Gouty RN, BSN Entered By: [name] on 11/23/2019 10:43:26 -------------------------------------------------------------------------------- Multi Wound Chart Details Patient Name: Date of Service: [name], [name] Frank D. 11/23/2019 9:45 A M Medical Record Number: [id number] Patient Account Number: [id number] Date of Birth/Sex: Treating RN: May 10, 1960 (59 y.o. [name] Primary Care Isaak Delmundo: SYSTEM, PCP Other Clinician: Referring Pellegrino Kennard: Treating Delainey Winstanley/Extender: [name] in Treatment: 26 Vital Signs Height(in): 50 Pulse(bpm): 75 Weight(lbs): 29 Blood Pressure(mmHg): 126/72 Body Mass Index(BMI): 27 Temperature(F): 97.9 Respiratory Rate(breaths/min): 16 Photos: [3R:No Photos Left, Lateral Foot] [7:No 53 Left T Second oe] [8:No Photos Left, Lateral Lower Leg] Wound Location: [3R:Gradually Appeared] [7:Gradually Appeared] [8:Pressure Injury] Wounding Event: [3R:Diabetic Wound/Ulcer of the Lower] [7:Diabetic Wound/Ulcer of the Lower] [8:Pressure Ulcer] Primary Etiology: [3R:Extremity Glaucoma, Coronary Artery Disease, Glaucoma, Coronary Artery Disease, Glaucoma, Coronary Artery Disease,] [7:Extremity] Comorbid History: [3R:Deep Vein Thrombosis, Hypertension, Deep Vein Thrombosis, Hypertension, Deep Vein Thrombosis, Hypertension, Peripheral Arterial Disease, Type II Peripheral Arterial Disease, Type II Peripheral Arterial Disease, Type II Diabetes,  Dementia, Neuropathy, Seizure Disorder 03/05/2019] [7:Diabetes, Dementia, Neuropathy, Seizure Disorder 09/22/2019] [8:Diabetes, Dementia, Neuropathy,  Seizure Disorder 11/23/2019] Date Acquired: [3R:26] [7:8] [8:0] Weeks of Treatment: [3R:Open] [7:Open] [8:Open] Wound Status: [3R:Yes] [7:No] [8:No] Wound Recurrence: [3R:3.8x2x1] [7:0.7x0.8x0.5] [8:2.3x1.5x0.1] Measurements L x W x D (cm) [3R:5.969] [7:0.44] [8:2.71] A (cm) : rea [3R:5.969] [7:0.22] [8:0.271] Volume (cm) : [3R:-103.20%] [7:-166.70%] [8:0.00%] % Reduction in A rea: [3R:-12.90%] [7:-1275.00%] [8:0.00%] % Reduction in Volume: [3R:Grade 2] [7:Grade 2] [8:Unstageable/Unclassified] Classification: [3R:Medium] [7:Small] [8:Medium] Exudate A mount: [3R:Serosanguineous] [7:Serosanguineous] [8:Serosanguineous] Exudate Type: [3R:red, brown] [7:red, brown] [8:red, brown] Exudate Color: [3R:Distinct, outline attached] [7:Flat and Intact] [8:Flat and Intact] Wound Margin: [3R:None Present (0%)] [7:Medium (34-66%)] [8:None Present (0%)] Granulation A mount: [3R:N/A] [7:Red] [  8:N/A] Granulation Quality: [3R:Large (67-100%)] [7:Medium (34-66%)] [8:Large (67-100%)] Necrotic A mount: [3R:Fat Layer (Subcutaneous Tissue): Yes Fat Layer (Subcutaneous Tissue): Yes Fascia: No] Exposed Structures: [3R:Fascia: No Tendon: No Muscle: No Joint: No Bone: No Small (1-33%)] [7:Fascia: No Tendon: No Muscle: No Joint: No Bone: No Small (1-33%)] [8:Fat Layer (Subcutaneous Tissue): No Tendon: No Muscle: No Joint: No Bone: No None] Treatment Notes Wound #3R (Left, Lateral Foot) 3. Primary Dressing Applied Santyl 4. Secondary Dressing Dry Gauze Roll Gauze Foam 7. Footwear/Offloading device applied Multipodus Splint/Boot Wound #7 (Left Toe Second) 3. Primary Dressing Applied Calcium Alginate Ag 4. Secondary Dressing Dry Gauze Roll Gauze Wound #8 (Left, Proximal, Lateral Lower Leg) 2. Periwound Care Skin Prep 3. Primary Dressing Applied Santyl 4. Secondary Dressing Foam Border Dressing Electronic Signature(s) Signed: 11/23/2019 5:02:14 PM By: Frank Hurst RN, BSN Signed: 11/23/2019 5:29:29  PM By: Frank Ham MD Entered By: [name] on 11/23/2019 11:14:33 -------------------------------------------------------------------------------- Multi-Disciplinary Care Plan Details Patient Name: Date of Service: [name], [name] Frank D. 11/23/2019 9:45 A M Medical Record Number: [id number] Patient Account Number: [id number] Date of Birth/Sex: Treating RN: 03-30-1961 (59 y.o. [name] Primary Care Ceri Mayer: SYSTEM, PCP Other Clinician: Referring Tonesha Tsou: Treating Jettson Crable/Extender: [name] in Treatment: 26 Active Inactive Nutrition Nursing Diagnoses: Imbalanced nutrition Goals: Patient/caregiver will maintain therapeutic glucose control Date Initiated: 05/19/2019 Target Resolution Date: 12/22/2019 Goal Status: Active Interventions: Provide education on elevated blood sugars and impact on wound healing Notes: Wound/Skin Impairment Nursing Diagnoses: Impaired tissue integrity Goals: Patient/caregiver will verbalize understanding of skin care regimen Date Initiated: 11/23/2019 Target Resolution Date: 12/22/2019 Goal Status: Active Ulcer/skin breakdown will have a volume reduction of 50% by week 8 Date Initiated: 05/19/2019 Date Inactivated: 11/23/2019 Target Resolution Date: 11/24/2019 Goal Status: Met Interventions: Provide education on ulcer and skin care Notes: Electronic Signature(s) Signed: 11/23/2019 5:02:14 PM By: Frank Hurst RN, BSN Entered By: [name] on 11/23/2019 13:15:24 -------------------------------------------------------------------------------- Pain Assessment Details Patient Name: Date of Service: [name], [name] Frank D. 11/23/2019 9:45 A M Medical Record Number: [id number] Patient Account Number: [id number] Date of Birth/Sex: Treating RN: 1960-08-31 (59 y.o. [name] Primary Care Jozlin Bently: SYSTEM, PCP Other Clinician: Referring Landry Kamath: Treating Ralph Benavidez/Extender: [name] in Treatment: 26 Active Problems Location of Pain Severity and Description of Pain Patient Has Paino No Site Locations Pain Management and Medication Current Pain Management: Electronic Signature(s) Signed: 11/23/2019 5:02:14 PM By: Frank Hurst RN, BSN Signed: 01/23/2020 8:09:04 AM By: [name] Entered By: [name] on 11/23/2019 09:25:45 -------------------------------------------------------------------------------- Patient/Caregiver Education Details Patient Name: Date of Service: [name] Frank D. 7/22/2021andnbsp9:45 A M Medical Record Number: [id number] Patient Account Number: [id number] Date of Birth/Gender: Treating RN: [birth year] (58 y.o. [name] Primary Care Physician: SYSTEM, PCP Other Clinician: Referring Physician: Treating Physician/Extender: [name] in Treatment: 26 Education Assessment Education Provided To: Patient Education Topics Provided Wound/Skin Impairment: Methods: Explain/Verbal Responses: State content correctly [employer]) Signed: 11/23/2019 5:02:14 PM By: Frank Hurst RN, BSN Entered By: [name] on 11/23/2019 13:15:36 -------------------------------------------------------------------------------- Wound Assessment Details Patient Name: Date of Service: [name], [name] Frank D. 11/23/2019 9:45 A M Medical Record Number: [id number] Patient Account Number: [id number] Date of Birth/Sex: Treating RN: 04/14/1961 (59 y.o. [name] Primary Care Dvora Buitron: SYSTEM, PCP Other Clinician: Referring Maxen Rowland: Treating Antwan Bribiesca/Extender: [name] in Treatment: 26 Wound Status Wound Number: 3R Primary Diabetic Wound/Ulcer of the Lower Extremity Etiology: Wound Location: Left, Lateral Foot  Wound Open Wounding Event: Gradually Appeared Status: Date Acquired: 03/05/2019 Comorbid Glaucoma, Coronary Artery Disease, Deep Vein  Thrombosis, Weeks Of Treatment: 26 History: Hypertension, Peripheral Arterial Disease, Type II Diabetes, Clustered Wound: No Dementia, Neuropathy, Seizure Disorder Photos Photo Uploaded By: Mikeal Hawthorne on 11/23/2019 15:11:37 Wound Measurements Length: (cm) 3.8 Width: (cm) 2 Depth: (cm) 1 Area: (cm) 5.969 Volume: (cm) 5.969 % Reduction in Area: -103.2% % Reduction in Volume: -12.9% Epithelialization: Small (1-33%) Tunneling: No Undermining: No Wound Description Classification: Grade 2 Wound Margin: Distinct, outline attached Exudate Amount: Medium Exudate Type: Serosanguineous Exudate Color: red, brown Foul Odor After Cleansing: No Slough/Fibrino Yes Wound Bed Granulation Amount: None Present (0%) Exposed Structure Necrotic Amount: Large (67-100%) Fascia Exposed: No Necrotic Quality: Adherent Slough Fat Layer (Subcutaneous Tissue) Exposed: Yes Tendon Exposed: No Muscle Exposed: No Joint Exposed: No Bone Exposed: No Electronic Signature(s) Signed: 11/23/2019 5:02:14 PM By: Frank Hurst RN, BSN Entered By: [name] on 11/23/2019 09:49:00 -------------------------------------------------------------------------------- Wound Assessment Details Patient Name: Date of Service: [name], [name] Frank D. 11/23/2019 9:45 A M Medical Record Number: [id number] Patient Account Number: [id number] Date of Birth/Sex: Treating RN: [birth year] (59 y.o. [name] Primary Care Maiko Salais: SYSTEM, PCP Other Clinician: Referring Zaeda Mcferran: Treating Hadasa Gasner/Extender: [name] in Treatment: 26 Wound Status Wound Number: 7 Primary Diabetic Wound/Ulcer of the Lower Extremity Etiology: Wound Location: Left T Second oe Wound Open Wounding Event: Gradually Appeared Status: Date Acquired: 09/22/2019 Comorbid Glaucoma, Coronary Artery Disease, Deep Vein Thrombosis, Weeks Of Treatment: 8 History: Hypertension, Peripheral Arterial Disease, Type II  Diabetes, Clustered Wound: No Dementia, Neuropathy, Seizure Disorder Photos Photo Uploaded By: Mikeal Hawthorne on 11/23/2019 15:15:00 Wound Measurements Length: (cm) 0.7 Width: (cm) 0.8 Depth: (cm) 0.5 Area: (cm) 0.44 Volume: (cm) 0.22 % Reduction in Area: -166.7% % Reduction in Volume: -1275% Epithelialization: Small (1-33%) Tunneling: No Undermining: No Wound Description Classification: Grade 2 Wound Margin: Flat and Intact Exudate Amount: Small Exudate Type: Serosanguineous Exudate Color: red, brown Foul Odor After Cleansing: No Slough/Fibrino Yes Wound Bed Granulation Amount: Medium (34-66%) Exposed Structure Granulation Quality: Red Fascia Exposed: No Necrotic Amount: Medium (34-66%) Fat Layer (Subcutaneous Tissue) Exposed: Yes Necrotic Quality: Adherent Slough Tendon Exposed: No Muscle Exposed: No Joint Exposed: No Bone Exposed: No Electronic Signature(s) Signed: 11/23/2019 5:02:14 PM By: Frank Hurst RN, BSN Entered By: [name] on 11/23/2019 09:49:24 -------------------------------------------------------------------------------- Wound Assessment Details Patient Name: Date of Service: [name], [name] Frank D. 11/23/2019 9:45 A M Medical Record Number: [id number] Patient Account Number: [id number] Date of Birth/Sex: Treating RN: 1961/04/21 (59 y.o. [name] Primary Care Kinzlee Selvy: SYSTEM, PCP Other Clinician: Referring Teresa Lemmerman: Treating Reilley Latorre/Extender: [name] in Treatment: 26 Wound Status Wound Number: 8 Primary Pressure Ulcer Etiology: Wound Location: Left, Lateral Lower Leg Wound Open Wounding Event: Pressure Injury Status: Date Acquired: 11/23/2019 Comorbid Glaucoma, Coronary Artery Disease, Deep Vein Thrombosis, Weeks Of Treatment: 0 History: Hypertension, Peripheral Arterial Disease, Type II Diabetes, Clustered Wound: No Dementia, Neuropathy, Seizure Disorder Photos Photo Uploaded By: Mikeal Hawthorne on  11/23/2019 15:11:37 Wound Measurements Length: (cm) 2.3 Width: (cm) 1.5 Depth: (cm) 0.1 Area: (cm) 2.71 Volume: (cm) 0.271 % Reduction in Area: 0% % Reduction in Volume: 0% Epithelialization: None Tunneling: No Undermining: No Wound Description Classification: Unstageable/Unclassified Wound Margin: Flat and Intact Exudate Amount: Medium Exudate Type: Serosanguineous Exudate Color: red, brown Foul Odor After Cleansing: No Slough/Fibrino Yes Wound Bed Granulation Amount: None Present (0%) Exposed Structure Necrotic Amount: Large (67-100%) Fascia Exposed: No Necrotic Quality:  Adherent Slough Fat Layer (Subcutaneous Tissue) Exposed: No Tendon Exposed: No Muscle Exposed: No Joint Exposed: No Bone Exposed: No Electronic Signature(s) Signed: 11/23/2019 5:02:14 PM By: Frank Hurst RN, BSN Entered By: [name] on 11/23/2019 09:50:00 -------------------------------------------------------------------------------- [place] Details Patient Name: Date of Service: [name], [name] Frank D. 11/23/2019 9:45 A M Medical Record Number: [id number] Patient Account Number: [id number] Date of Birth/Sex: Treating RN: January 31, 1961 (59 y.o. [name] Primary Care Kari Montero: SYSTEM, PCP Other Clinician: Referring Bertis Hustead: Treating Eryka Dolinger/Extender: [name] in Treatment: 26 Vital Signs Time Taken: 09:25 Temperature (F): 97.9 Height (in): 73 Pulse (bpm): 83 Weight (lbs): 207 Respiratory Rate (breaths/min): 16 Body Mass Index (BMI): 27.3 Blood Pressure (mmHg): 126/72 Reference Range: 80 - 120 mg / dl Electronic Signature(s) Signed: 01/23/2020 8:09:04 AM By: [name] Entered By: [name] on 11/23/2019 09:25:40

## 2020-01-29 ENCOUNTER — Other Ambulatory Visit: Payer: Self-pay

## 2020-01-29 ENCOUNTER — Encounter (HOSPITAL_BASED_OUTPATIENT_CLINIC_OR_DEPARTMENT_OTHER): Payer: Medicare Other | Attending: Internal Medicine | Admitting: Internal Medicine

## 2020-01-29 DIAGNOSIS — E11621 Type 2 diabetes mellitus with foot ulcer: Secondary | ICD-10-CM | POA: Diagnosis not present

## 2020-01-29 DIAGNOSIS — L98499 Non-pressure chronic ulcer of skin of other sites with unspecified severity: Secondary | ICD-10-CM | POA: Diagnosis not present

## 2020-01-29 DIAGNOSIS — I10 Essential (primary) hypertension: Secondary | ICD-10-CM | POA: Insufficient documentation

## 2020-01-29 DIAGNOSIS — Z794 Long term (current) use of insulin: Secondary | ICD-10-CM | POA: Diagnosis not present

## 2020-01-29 DIAGNOSIS — L97828 Non-pressure chronic ulcer of other part of left lower leg with other specified severity: Secondary | ICD-10-CM | POA: Insufficient documentation

## 2020-01-29 DIAGNOSIS — F0390 Unspecified dementia without behavioral disturbance: Secondary | ICD-10-CM | POA: Diagnosis not present

## 2020-01-29 DIAGNOSIS — I2510 Atherosclerotic heart disease of native coronary artery without angina pectoris: Secondary | ICD-10-CM | POA: Diagnosis not present

## 2020-01-29 DIAGNOSIS — G40909 Epilepsy, unspecified, not intractable, without status epilepticus: Secondary | ICD-10-CM | POA: Insufficient documentation

## 2020-01-29 DIAGNOSIS — E1140 Type 2 diabetes mellitus with diabetic neuropathy, unspecified: Secondary | ICD-10-CM | POA: Diagnosis not present

## 2020-01-29 DIAGNOSIS — E1151 Type 2 diabetes mellitus with diabetic peripheral angiopathy without gangrene: Secondary | ICD-10-CM | POA: Insufficient documentation

## 2020-01-29 DIAGNOSIS — Z86718 Personal history of other venous thrombosis and embolism: Secondary | ICD-10-CM | POA: Insufficient documentation

## 2020-01-29 DIAGNOSIS — E785 Hyperlipidemia, unspecified: Secondary | ICD-10-CM | POA: Insufficient documentation

## 2020-01-29 DIAGNOSIS — L97524 Non-pressure chronic ulcer of other part of left foot with necrosis of bone: Secondary | ICD-10-CM | POA: Diagnosis not present

## 2020-01-29 DIAGNOSIS — H42 Glaucoma in diseases classified elsewhere: Secondary | ICD-10-CM | POA: Insufficient documentation

## 2020-01-29 DIAGNOSIS — E1139 Type 2 diabetes mellitus with other diabetic ophthalmic complication: Secondary | ICD-10-CM | POA: Diagnosis not present

## 2020-01-29 DIAGNOSIS — F329 Major depressive disorder, single episode, unspecified: Secondary | ICD-10-CM | POA: Diagnosis not present

## 2020-01-29 DIAGNOSIS — E11622 Type 2 diabetes mellitus with other skin ulcer: Secondary | ICD-10-CM | POA: Diagnosis present

## 2020-01-29 DIAGNOSIS — E1169 Type 2 diabetes mellitus with other specified complication: Secondary | ICD-10-CM | POA: Diagnosis not present

## 2020-01-29 DIAGNOSIS — M86472 Chronic osteomyelitis with draining sinus, left ankle and foot: Secondary | ICD-10-CM | POA: Diagnosis not present

## 2020-01-30 NOTE — Progress Notes (Signed)
[name], [name] ([id number]) Visit Report for 01/29/2020 Arrival Information Details Patient Name: Date of Service: Frank Ours [place] D. 01/29/2020 2:15 PM Medical Record Number: [id number] Patient Account Number: [id number] Date of Birth/Sex: Treating RN: [name] Primary Care Harman Langhans: SYSTEM, PCP Other Clinician: Referring Ishita Mcnerney: Treating [name] in Treatment: 30 Visit Information History Since Last Visit Added or deleted any medications: No Patient Arrived: Wheel Chair Any new allergies or adverse reactions: No Arrival Time: 14:25 Had a fall or experienced change in No Accompanied By: craegiver activities of daily living that may affect Transfer Assistance: None risk of falls: Patient Identification Verified: Yes Signs or symptoms of abuse/neglect since last visito No Secondary Verification Process Completed: Yes Hospitalized since last visit: No Patient Requires Transmission-Based Precautions: No Implantable device outside of the clinic excluding No Patient Has Alerts: Yes cellular tissue based products placed in the center Patient Alerts: R and L ABIs N/C since last visit: Has Dressing in Place as Prescribed: Yes Pain Present Now: No Electronic Signature(s) Signed: 01/30/2020 8:24:37 AM By: Sandre Kitty Entered By: Sandre Kitty on 01/29/2020 14:26:14 -------------------------------------------------------------------------------- Clinic Level of Care Assessment Details Patient Name: Date of Service: Frank Ours [place] D. 01/29/2020 2:15 PM Medical Record Number: [id number] Patient Account Number: [id number] Date of Birth/Sex: Treating RN: 22-May-1960 (59 y.o. Janyth [name] Primary Care Pricilla Moehle: SYSTEM, PCP Other Clinician: Referring Amere Iott: Treating [name] in Treatment: 36 Clinic Level of Care Assessment Items TOOL 4 Quantity Score X-  1 0 Use when only an EandM is performed on FOLLOW-UP visit ASSESSMENTS - Nursing Assessment / Reassessment X- 1 10 Reassessment of Co-morbidities (includes updates in patient status) X- 1 5 Reassessment of Adherence to Treatment Plan ASSESSMENTS - Wound and Skin A ssessment / Reassessment []  - 0 Simple Wound Assessment / Reassessment - one wound X- 3 5 Complex Wound Assessment / Reassessment - multiple wounds []  - 0 Dermatologic / Skin Assessment (not related to wound area) ASSESSMENTS - Focused Assessment []  - 0 Circumferential Edema Measurements - multi extremities []  - 0 Nutritional Assessment / Counseling / Intervention []  - 0 Lower Extremity Assessment (monofilament, tuning fork, pulses) []  - 0 Peripheral Arterial Disease Assessment (using hand held doppler) ASSESSMENTS - Ostomy and/or Continence Assessment and Care []  - 0 Incontinence Assessment and Management []  - 0 Ostomy Care Assessment and Management (repouching, etc.) PROCESS - Coordination of Care X - Simple Patient / Family Education for ongoing care 1 15 []  - 0 Complex (extensive) Patient / Family Education for ongoing care X- 1 10 Staff obtains Programmer, systems, Records, T Results / Process Orders est X- 1 10 Staff telephones HHA, Nursing Homes / Clarify orders / etc []  - 0 Routine Transfer to another Facility (non-emergent condition) []  - 0 Routine Hospital Admission (non-emergent condition) []  - 0 New Admissions / Biomedical engineer / Ordering NPWT Apligraf, etc. , []  - 0 Emergency Hospital Admission (emergent condition) []  - 0 Simple Discharge Coordination []  - 0 Complex (extensive) Discharge Coordination PROCESS - Special Needs []  - 0 Pediatric / Minor Patient Management []  - 0 Isolation Patient Management []  - 0 Hearing / Language / Visual special needs []  - 0 Assessment of Community assistance (transportation, D/C planning, etc.) []  - 0 Additional assistance / Altered mentation []  -  0 Support Surface(s) Assessment (bed, cushion, seat, etc.) INTERVENTIONS - Wound Cleansing / Measurement []  - 0 Simple Wound Cleansing - one wound X- 3 5  Complex Wound Cleansing - multiple wounds X- 1 5 Wound Imaging (photographs - any number of wounds) []  - 0 Wound Tracing (instead of photographs) []  - 0 Simple Wound Measurement - one wound X- 3 5 Complex Wound Measurement - multiple wounds INTERVENTIONS - Wound Dressings []  - 0 Small Wound Dressing one or multiple wounds X- 1 15 Medium Wound Dressing one or multiple wounds []  - 0 Large Wound Dressing one or multiple wounds X- 1 5 Application of Medications - topical []  - 0 Application of Medications - injection INTERVENTIONS - Miscellaneous []  - 0 External ear exam []  - 0 Specimen Collection (cultures, biopsies, blood, body fluids, etc.) []  - 0 Specimen(s) / Culture(s) sent or taken to Lab for analysis []  - 0 Patient Transfer (multiple staff / Civil Service fast streamer / Similar devices) []  - 0 Simple Staple / Suture removal (25 or less) []  - 0 Complex Staple / Suture removal (26 or more) []  - 0 Hypo / Hyperglycemic Management (close monitor of Blood Glucose) []  - 0 Ankle / Brachial Index (ABI) - do not check if billed separately X- 1 5 Vital Signs Has the patient been seen at the hospital within the last three years: Yes Total Score: 125 Level Of Care: New/Established - Level 4 Electronic Signature(s) Signed: 01/29/2020 5:31:38 PM By: Levan Hurst RN, BSN Entered By: Levan Hurst on 01/29/2020 17:25:39 -------------------------------------------------------------------------------- Encounter Discharge Information Details Patient Name: Date of Service: [name], Frank Kennedy EN D. 01/29/2020 2:15 PM Medical Record Number: [id number] Patient Account Number: [id number] Date of Birth/Sex: Treating RN: [birth year] (59 y.o. [name] Primary Care Elienai Gailey: SYSTEM, PCP Other Clinician: Referring Jahn Franchini: Treating  [name] in Treatment: 36 Encounter Discharge Information Items Discharge Condition: Stable Ambulatory Status: Wheelchair Discharge Destination: Skilled Nursing Facility Telephoned: No Orders Sent: Yes Transportation: Private Auto Accompanied By: caregiver Schedule Follow-up Appointment: Yes Clinical Summary of Care: Electronic Signature(s) Signed: 01/29/2020 5:26:25 PM By: Deon Pilling Entered By: Deon Pilling on 01/29/2020 17:13:06 -------------------------------------------------------------------------------- Multi Wound Chart Details Patient Name: Date of Service: [name], Frank Kennedy EN D. 01/29/2020 2:15 PM Medical Record Number: [id number] Patient Account Number: [id number] Date of Birth/Sex: Treating RN: [birth year] (59 y.o. Janyth [name] Primary Care Loranzo Desha: SYSTEM, PCP Other Clinician: Referring Angelene Rome: Treating Enjoli Tidd/Extender: Thea [name] in Treatment: 36 Vital Signs Height(in): 44 Pulse(bpm): 85 Weight(lbs): 85 Blood Pressure(mmHg): 123/77 Body Mass Index(BMI): 27 Temperature(F): 98.4 Respiratory Rate(breaths/min): 18 Photos: [3R:No Photos Left, Lateral Foot] [7:No 3 Left T Second oe] [8:No Photos Left, Proximal, Lateral Lower Leg] Wound Location: [3R:Gradually Appeared] [7:Gradually Appeared] [8:Pressure Injury] Wounding Event: [3R:Diabetic Wound/Ulcer of the Lower] [7:Diabetic Wound/Ulcer of the Lower] [8:Pressure Ulcer] Primary Etiology: [3R:Extremity Glaucoma, Coronary Artery Disease, Glaucoma, Coronary Artery Disease,] [7:Extremity] [8:Glaucoma, Coronary Artery Disease,] Comorbid History: [3R:Deep Vein Thrombosis, Hypertension, Deep Vein Thrombosis, Hypertension, Peripheral Arterial Disease, Type II Peripheral Arterial Disease, Type II Diabetes, Dementia, Neuropathy, Seizure Disorder 03/05/2019] [7:Diabetes, Dementia,  Neuropathy, Seizure Disorder 09/22/2019] [8:Deep Vein Thrombosis,  Hypertension, Peripheral Arterial Disease, Type II Diabetes, Dementia, Neuropathy, Seizure Disorder 11/23/2019] Date Acquired: [3R:36] [7:18] [8:9] Weeks of Treatment: [3R:Open] [7:Open] [8:Open] Wound Status: [3R:Yes] [7:No] [8:No] Wound Recurrence: [3R:2x0.9x0.7] [7:0x0x0] [8:0x0x0] Measurements L x W x D (cm) [3R:1.414] [7:0] [8:0] A (cm) : rea [3R:0.99] [7:0] [8:0] Volume (cm) : [3R:51.90%] [7:100.00%] [8:100.00%] % Reduction in A rea: [3R:81.30%] [7:100.00%] [8:100.00%] % Reduction in Volume: [3R:Grade 2] [7:Grade 2] [8:Category/Stage III] Classification: [3R:Medium] [7:None Present] [8:None Present] Exudate A mount: [3R:Serosanguineous] [7:N/A] [8:N/A] Exudate Type: [  3R:red, brown] [7:N/A] [8:N/A] Exudate Color: [3R:Distinct, outline attached] [7:Flat and Intact] [8:Flat and Intact] Wound Margin: [3R:None Present (0%)] [7:None Present (0%)] [8:None Present (0%)] Granulation A mount: [3R:N/A] [7:N/A] [8:N/A] Granulation Quality: [3R:Large (67-100%)] [7:None Present (0%)] [8:None Present (0%)] Necrotic A mount: [3R:Fat Layer (Subcutaneous Tissue): Yes Fascia: No] [8:Fascia: No] Exposed Structures: [3R:Fascia: No Tendon: No Muscle: No Joint: No Bone: No Small (1-33%)] [7:Fat Layer (Subcutaneous Tissue): No Tendon: No Muscle: No Joint: No Bone: No None] [8:Fat Layer (Subcutaneous Tissue): No Tendon: No Muscle: No Joint: No Bone: No None] Wound Number: 8 9 N/A Photos: No Photos No Photos N/A Left, Proximal, Lateral Lower Leg Left, Medial Foot N/A Wound Location: Pressure Injury Pressure Injury N/A Wounding Event: Pressure Ulcer Pressure Ulcer N/A Primary Etiology: Glaucoma, Coronary Artery Disease, Glaucoma, Coronary Artery Disease, N/A Comorbid History: Deep Vein Thrombosis, Hypertension, Deep Vein Thrombosis, Hypertension, Peripheral Arterial Disease, Type II Peripheral Arterial Disease, Type II Diabetes, Dementia, Neuropathy, Diabetes, Dementia, Neuropathy, Seizure Disorder  Seizure Disorder 11/23/2019 01/29/2020 N/A Date Acquired: 9 0 N/A Weeks of Treatment: Healed - Epithelialized Open N/A Wound Status: No No N/A Wound Recurrence: 0x0x0 0.5x0.8x0.2 N/A Measurements L x W x D (cm) 0 0.314 N/A A (cm) : rea 0 0.063 N/A Volume (cm) : 100.00% 0.00% N/A % Reduction in A rea: 100.00% 0.00% N/A % Reduction in Volume: Category/Stage III Category/Stage III N/A Classification: None Present Medium N/A Exudate A mount: N/A Serosanguineous N/A Exudate Type: N/A red, brown N/A Exudate Color: Flat and Intact N/A N/A Wound Margin: None Present (0%) Medium (34-66%) N/A Granulation A mount: N/A Pink N/A Granulation Quality: None Present (0%) Medium (34-66%) N/A Necrotic A mount: Fascia: No Fat Layer (Subcutaneous Tissue): Yes N/A Exposed Structures: Fat Layer (Subcutaneous Tissue): No Fascia: No Tendon: No Tendon: No Muscle: No Muscle: No Joint: No Joint: No Bone: No Bone: No Large (67-100%) Large (67-100%) N/A Epithelialization: Treatment Notes Wound #3R (Left, Lateral Foot) 1. Cleanse With Wound Cleanser 3. Primary Dressing Applied Collegen AG 4. Secondary Dressing Dry Gauze Roll Gauze Foam 5. Secured With Medipore tape Notes foam donut as secondary. Wound #7 (Left Toe Second) 1. Cleanse With Wound Cleanser 3. Primary Dressing Applied Collegen [employer] 4. Secondary Dressing Dry Gauze Roll Gauze 5. Secured With Medipore tape 7. Footwear/Offloading device applied Multipodus Splint/Boot Wound #9 (Left, Medial Foot) 1. Cleanse With Wound Cleanser 3. Primary Dressing Applied Collegen [employer] 4. Secondary Dressing Dry Gauze Roll Gauze Foam 5. Secured With Medipore tape Notes foam donut as secondary. Electronic Signature(s) Signed: 01/30/2020 7:37:15 AM By: Linton Ham MD Signed: 01/30/2020 11:42:07 AM By: Levan Hurst RN, BSN Entered By: Linton Ham on 01/30/2020  03:38:39 -------------------------------------------------------------------------------- Multi-Disciplinary Care Plan Details Patient Name: Date of Service: [name], Frank Kennedy EN D. 01/29/2020 2:15 PM Medical Record Number: [id number] Patient Account Number: [id number] Date of Birth/Sex: Treating RN: 07-14-1960 (59 y.o. Janyth [name] Primary Care Adaliz Dobis: SYSTEM, PCP Other Clinician: Referring Sevyn Paredez: Treating Alvey Brockel/Extender: Thea [name] in Treatment: 36 Active Inactive Wound/Skin Impairment Nursing Diagnoses: Impaired tissue integrity Goals: Patient/caregiver will verbalize understanding of skin care regimen Date Initiated: 11/23/2019 Target Resolution Date: 03/01/2020 Goal Status: Active Ulcer/skin breakdown will have a volume reduction of 50% by week 8 Date Initiated: 05/19/2019 Date Inactivated: 11/23/2019 Target Resolution Date: 11/24/2019 Goal Status: Met Interventions: Provide education on ulcer and skin care Notes: Electronic Signature(s) Signed: 01/29/2020 5:31:38 PM By: Levan Hurst RN, BSN Entered By: Levan Hurst on 01/29/2020 17:25:00 -------------------------------------------------------------------------------- Pain Assessment Details Patient Name: Date  of Service: Frank Ours [place] D. 01/29/2020 2:15 PM Medical Record Number: [id number] Patient Account Number: [id number] Date of Birth/Sex: Treating RN: 1961-03-08 (59 y.o. Janyth [name] Primary Care Zenora Karpel: SYSTEM, PCP Other Clinician: Referring Shalisa Mcquade: Treating Ashland Wiseman/Extender: Thea [name] in Treatment: 36 Active Problems Location of Pain Severity and Description of Pain Patient Has Paino No Site Locations Pain Management and Medication Current Pain Management: Electronic Signature(s) Signed: 01/29/2020 5:31:38 PM By: Levan Hurst RN, BSN Signed: 01/30/2020 8:24:37 AM By: Sandre Kitty Entered By: Sandre Kitty on 01/29/2020  14:26:43 -------------------------------------------------------------------------------- Patient/Caregiver Education Details Patient Name: Date of Service: Frank Ours EN D. 9/27/2021andnbsp2:15 PM Medical Record Number: [id number] Patient Account Number: [id number] Date of Birth/Gender: Treating RN: 17-Apr-1961 (59 y.o. Janyth [name] Primary Care Physician: SYSTEM, PCP Other Clinician: Referring Physician: Treating Physician/Extender: Thea [name] in Treatment: 36 Education Assessment Education Provided To: Patient Education Topics Provided Wound/Skin Impairment: Methods: Explain/Verbal Responses: State content correctly [employer]) Signed: 01/29/2020 5:31:38 PM By: Levan Hurst RN, BSN Entered By: Levan Hurst on 01/29/2020 17:25:10 -------------------------------------------------------------------------------- Wound Assessment Details Patient Name: Date of Service: [name], Frank Kennedy EN D. 01/29/2020 2:15 PM Medical Record Number: [id number] Patient Account Number: [id number] Date of Birth/Sex: Treating RN: 08/10/1960 (59 y.o. Janyth [name] Primary Care Faolan Springfield: SYSTEM, PCP Other Clinician: Referring Jazzmin Newbold: Treating Tanush Drees/Extender: Thea [name] in Treatment: 73 Wound Status Wound Number: 3R Primary Diabetic Wound/Ulcer of the Lower Extremity Etiology: Wound Location: Left, Lateral Foot Wound Open Wounding Event: Gradually Appeared Status: Date Acquired: 03/05/2019 Comorbid Glaucoma, Coronary Artery Disease, Deep Vein Thrombosis, Weeks Of Treatment: 36 History: Hypertension, Peripheral Arterial Disease, Type II Diabetes, Clustered Wound: No Dementia, Neuropathy, Seizure Disorder Wound Measurements Length: (cm) 2 Width: (cm) 0.9 Depth: (cm) 0.7 Area: (cm) 1.414 Volume: (cm) 0.99 % Reduction in Area: 51.9% % Reduction in Volume: 81.3% Epithelialization: Small (1-33%) Tunneling:  No Undermining: No Wound Description Classification: Grade 2 Wound Margin: Distinct, outline attached Exudate Amount: Medium Exudate Type: Serosanguineous Exudate Color: red, brown Foul Odor After Cleansing: No Slough/Fibrino Yes Wound Bed Granulation Amount: None Present (0%) Exposed Structure Necrotic Amount: Large (67-100%) Fascia Exposed: No Necrotic Quality: Adherent Slough Fat Layer (Subcutaneous Tissue) Exposed: Yes Tendon Exposed: No Muscle Exposed: No Joint Exposed: No Bone Exposed: No Treatment Notes Wound #3R (Left, Lateral Foot) 1. Cleanse With Wound Cleanser 3. Primary Dressing Applied Collegen AG 4. Secondary Dressing Dry Gauze Roll Gauze Foam 5. Secured With Medipore tape Notes foam donut as secondary. Electronic Signature(s) Signed: 01/29/2020 5:31:38 PM By: Levan Hurst RN, BSN Signed: 01/30/2020 5:34:55 PM By: Carlene Coria RN Entered By: Carlene Coria on 01/29/2020 14:45:48 -------------------------------------------------------------------------------- Wound Assessment Details Patient Name: Date of Service: [name], Frank Kennedy EN D. 01/29/2020 2:15 PM Medical Record Number: [id number] Patient Account Number: [id number] Date of Birth/Sex: Treating RN: 12-29-1960 (59 y.o. Janyth [name] Primary Care Trishia Cuthrell: SYSTEM, PCP Other Clinician: Referring Sybol Morre: Treating Isidor Bromell/Extender: Thea [name] in Treatment: 36 Wound Status Wound Number: 7 Primary Diabetic Wound/Ulcer of the Lower Extremity Etiology: Wound Location: Left T Second oe Wound Open Wounding Event: Gradually Appeared Status: Date Acquired: 09/22/2019 Comorbid Glaucoma, Coronary Artery Disease, Deep Vein Thrombosis, Weeks Of Treatment: 18 History: Hypertension, Peripheral Arterial Disease, Type II Diabetes, Clustered Wound: No Dementia, Neuropathy, Seizure Disorder Wound Measurements Length: (cm) Width: (cm) Depth: (cm) Area: (cm) Volume: (cm) 0 %  Reduction in Area: 100% 0 % Reduction in Volume: 100% 0 Epithelialization: None 0 Tunneling: No  0 Undermining: No Wound Description Classification: Grade 2 Wound Margin: Flat and Intact Exudate Amount: None Present Foul Odor After Cleansing: No Slough/Fibrino No Wound Bed Granulation Amount: None Present (0%) Exposed Structure Necrotic Amount: None Present (0%) Fascia Exposed: No Fat Layer (Subcutaneous Tissue) Exposed: No Tendon Exposed: No Muscle Exposed: No Joint Exposed: No Bone Exposed: No Treatment Notes Wound #7 (Left Toe Second) 1. Cleanse With Wound Cleanser 3. Primary Dressing Applied Collegen AG 4. Secondary Dressing Dry Gauze Roll Gauze 5. Secured With Medipore tape 7. Footwear/Offloading device applied Multipodus Splint/Boot Electronic Signature(s) Signed: 01/29/2020 5:31:38 PM By: Levan Hurst RN, BSN Signed: 01/30/2020 5:34:55 PM By: Carlene Coria RN Entered By: Carlene Coria on 01/29/2020 14:46:38 -------------------------------------------------------------------------------- Wound Assessment Details Patient Name: Date of Service: [name], Frank Kennedy EN D. 01/29/2020 2:15 PM Medical Record Number: [id number] Patient Account Number: [id number] Date of Birth/Sex: Treating RN: 03/19/1961 (59 y.o. Janyth [name] Primary Care Camylle Whicker: SYSTEM, PCP Other Clinician: Referring Omah Dewalt: Treating Breshae Belcher/Extender: Thea [name] in Treatment: 36 Wound Status Wound Number: 8 Primary Pressure Ulcer Etiology: Wound Location: Left, Proximal, Lateral Lower Leg Wound Open Wounding Event: Pressure Injury Status: Date Acquired: 11/23/2019 Comorbid Glaucoma, Coronary Artery Disease, Deep Vein Thrombosis, Weeks Of Treatment: 9 History: Hypertension, Peripheral Arterial Disease, Type II Diabetes, Clustered Wound: No Dementia, Neuropathy, Seizure Disorder Wound Measurements Length: (cm) Width: (cm) Depth: (cm) Area: (cm) Volume: (cm) 0 %  Reduction in Area: 100% 0 % Reduction in Volume: 100% 0 Epithelialization: None 0 Tunneling: No 0 Undermining: No Wound Description Classification: Category/Stage III Wound Margin: Flat and Intact Exudate Amount: None Present Foul Odor After Cleansing: No Slough/Fibrino No Wound Bed Granulation Amount: None Present (0%) Exposed Structure Necrotic Amount: None Present (0%) Fascia Exposed: No Fat Layer (Subcutaneous Tissue) Exposed: No Tendon Exposed: No Muscle Exposed: No Joint Exposed: No Bone Exposed: No Electronic Signature(s) Signed: 01/29/2020 5:31:38 PM By: Levan Hurst RN, BSN Signed: 01/30/2020 5:34:55 PM By: Carlene Coria RN Entered By: Carlene Coria on 01/29/2020 14:47:44 -------------------------------------------------------------------------------- Wound Assessment Details Patient Name: Date of Service: [name], Frank Kennedy EN D. 01/29/2020 2:15 PM Medical Record Number: [id number] Patient Account Number: [id number] Date of Birth/Sex: Treating RN: 1960/09/10 (59 y.o. Janyth [name] Primary Care Alyn Jurney: SYSTEM, PCP Other Clinician: Referring Laurelai Lepp: Treating Costa Jha/Extender: Thea [name] in Treatment: 36 Wound Status Wound Number: 8 Primary Pressure Ulcer Etiology: Wound Location: Left, Proximal, Lateral Lower Leg Wound Healed - Epithelialized Wounding Event: Pressure Injury Status: Date Acquired: 11/23/2019 Comorbid Glaucoma, Coronary Artery Disease, Deep Vein Thrombosis, Weeks Of Treatment: 9 History: Hypertension, Peripheral Arterial Disease, Type II Diabetes, Clustered Wound: No Dementia, Neuropathy, Seizure Disorder Wound Measurements Length: (cm) Width: (cm) Depth: (cm) Area: (cm) Volume: (cm) 0 % Reduction in Area: 100% 0 % Reduction in Volume: 100% 0 Epithelialization: Large (67-100%) 0 Tunneling: No 0 Undermining: No Wound Description Classification: Category/Stage III Wound Margin: Flat and Intact Exudate  Amount: None Present Foul Odor After Cleansing: No Slough/Fibrino No Wound Bed Granulation Amount: None Present (0%) Exposed Structure Necrotic Amount: None Present (0%) Fascia Exposed: No Fat Layer (Subcutaneous Tissue) Exposed: No Tendon Exposed: No Muscle Exposed: No Joint Exposed: No Bone Exposed: No Electronic Signature(s) Signed: 01/29/2020 5:31:38 PM By: Levan Hurst RN, BSN Entered By: Levan Hurst on 01/29/2020 15:49:11 -------------------------------------------------------------------------------- Wound Assessment Details Patient Name: Date of Service: [name], Frank Kennedy EN D. 01/29/2020 2:15 PM Medical Record Number: [id number] Patient Account Number: [id number] Date of Birth/Sex: Treating RN: Jun 04, 1960 (59 y.o. Janyth [name]  Primary Care Deno Sida: SYSTEM, PCP Other Clinician: Referring Daylene Vandenbosch: Treating Savreen Gebhardt/Extender: Thea [name] in Treatment: 36 Wound Status Wound Number: 9 Primary Pressure Ulcer Etiology: Wound Location: Left, Medial Foot Wound Open Wounding Event: Pressure Injury Status: Date Acquired: 01/29/2020 Comorbid Glaucoma, Coronary Artery Disease, Deep Vein Thrombosis, Weeks Of Treatment: 0 History: Hypertension, Peripheral Arterial Disease, Type II Diabetes, Clustered Wound: No Dementia, Neuropathy, Seizure Disorder Wound Measurements Length: (cm) 0.5 Width: (cm) 0.8 Depth: (cm) 0.2 Area: (cm) 0.314 Volume: (cm) 0.063 % Reduction in Area: 0% % Reduction in Volume: 0% Epithelialization: Large (67-100%) Tunneling: No Undermining: No Wound Description Classification: Category/Stage III Exudate Amount: Medium Exudate Type: Serosanguineous Exudate Color: red, brown Foul Odor After Cleansing: No Slough/Fibrino Yes Wound Bed Granulation Amount: Medium (34-66%) Exposed Structure Granulation Quality: Pink Fascia Exposed: No Necrotic Amount: Medium (34-66%) Fat Layer (Subcutaneous Tissue) Exposed:  Yes Necrotic Quality: Adherent Slough Tendon Exposed: No Muscle Exposed: No Joint Exposed: No Bone Exposed: No Treatment Notes Wound #9 (Left, Medial Foot) 1. Cleanse With Wound Cleanser 3. Primary Dressing Applied Collegen AG 4. Secondary Dressing Dry Gauze Roll Gauze Foam 5. Secured With Medipore tape Notes foam donut as secondary. Electronic Signature(s) Signed: 01/29/2020 5:31:38 PM By: Levan Hurst RN, BSN Signed: 01/30/2020 5:34:55 PM By: Carlene Coria RN Entered By: Carlene Coria on 01/29/2020 14:48:54 -------------------------------------------------------------------------------- [place] Details Patient Name: Date of Service: [name], Frank Kennedy EN D. 01/29/2020 2:15 PM Medical Record Number: [id number] Patient Account Number: [id number] Date of Birth/Sex: Treating RN: 20-Jan-1961 (59 y.o. Janyth [name] Primary Care Renessa Wellnitz: SYSTEM, PCP Other Clinician: Referring Karlen Barbar: Treating Ellina Sivertsen/Extender: Thea [name] in Treatment: 36 Vital Signs Time Taken: 14:26 Temperature (F): 98.4 Height (in): 73 Pulse (bpm): 78 Weight (lbs): 207 Respiratory Rate (breaths/min): 18 Body Mass Index (BMI): 27.3 Blood Pressure (mmHg): 123/77 Reference Range: 80 - 120 mg / dl Electronic Signature(s) Signed: 01/30/2020 8:24:37 AM By: Sandre Kitty Entered By: Sandre Kitty on 01/29/2020 14:26:32

## 2020-01-30 NOTE — Progress Notes (Signed)
HOLSTON, OYAMA ([id number]) Visit Report for 01/29/2020 HPI Details Patient Name: Date of Service: [name] [place] D. 01/29/2020 2:15 PM Medical Record Number: [id number] Patient Account Number: [id number] Date of Birth/Sex: Treating RN: 01-11-1961 (59 y.o. [name] Primary Care Provider: SYSTEM, PCP Other Clinician: Referring Provider: Treating Provider/Extender: Thea Silversmith in Treatment: 36 History of Present Illness HPI Description: ADMISSION 05/19/2019 [name] is now a 59 year old man with type 2 diabetes. He was actually in this clinic in 2006 in 2008. I do not have these records. More recently I actually looked after him in primary care with [facility] up until 2016. Notable for the fact that I treated him for osteomyelitis in the right foot for an MRSA infection in the facility in 2014 or thereabouts. Gave him a prolonged course of vancomycin in the facility. My notes at the time do not list him has having significant PAD. He has severe diabetic neuropathy. The patient apparently has had wounds on his feet since November. This includes the right second toe and the left foot at the mid part of the fifth metatarsal. They are using Santyl on the left foot I am not clear what they are using to the right toe. I do not know if there is been any imaging studies done. It does not appear that his vascular status is been rechecked. 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 The patient is on a combination of Augmentin and Cipro both for 2 weeks.  The patient had his arterial studies done at [facility] yesterday I will need to research these results. We are using silver alginate on the left lateral foot and on the right second toe 2/12; he should be completing his antibiotics Augmentin and Cipro. Unfortunately had his arterial studies done at radiology at [facility] they did not do right ABI or left ABIs or TBI's for that matter even though all of these were ordered. Nevertheless on the right there was triphasic waveforms of the common femoral artery, profunda femoris SFA, popliteal artery. Triphasic posterior tibial and anterior tibial as well as peroneal arteries. On the left there was a definite drop off of the distal SFA and popliteal were monophasic. Comment made that they may need to correlate with an ABI on the left even though this was ordered. We will call up and see what the problem was there. He has wounds on the left lateral fifth metatarsal head and the right second toe. Paradoxically both of the wounds look somewhat better 2/26; patient had his arterial studies yesterday at [facility]. These showed an ABI of 0.97 at the dorsalis pedis on the right with a TBI of 0.54. Biphasic waveforms. On the left ABI at 0.94 at the dorsalis pedis TBI at 0.43. The area on the left lateral foot appears to be better. Right second toe was closed. At discharge he was noted to have wounds on the left medial fifth and the left lateral fourth and the webspace. Both toes were tender and erythematous 3/12. The area on the left lateral foot appears to be contracting. He has areas on the left medial third and the lateral fourth. These  may be friction areas from the toes pressing against each other. X-rays of the left foot did not comment osteomyelitis done at the facility 3/26 2-week follow-up. The patient arrives today with the area that we have been following on the left lateral foot superficial and looking better. However last time he was here he had new open  areas on the left medial third and lateral fourth toes. The lateral fourth toe was done well however the medial third toe has exposed bone. The toe itself is erythemtous and warm 4/30; the patient's left third toe and fourth toe are both closed. The area on the left lateral foot still has some probing depth. Weeping tissue no overt infection does not probe to bone. We have been using silver alginate on all these areas The patient has what looks to be a deep tissue injury on the medial part of the left first metatarsal head there is no open area here however 5/21; the patient's problematic area on the left lateral foot is still open but with some improvement. He has new open areas on the lateral part of the left second toe and the DTI injury over the metatarsal head is open today with a very superficial wound 6/4; the patient's original area on the left lateral foot appears to have healed. He does have what appears to be a denuded old blister just distal to this on the base of the left fifth metatarsal. There is nothing open here. There is nothing open on the left first metatarsal head lateral aspect if we are still having problems and concerns about 2 weeks ago. The only open area is on the lateral part of the left second toe 10/25/2019 upon evaluation today patient unfortunately presents with worsening of his second toe ulcer on the left. This actually is opened up, appears more erythematous, and has bone noted at the joint where there is no longer maintains alignment. When I attempted to look between the toes the distal portion of the toe actually slipped medially along the joint line. Again I think that the lateral connective tissue has deteriorated at this site. I am concerned about more extensive osteomyelitis at this point. 7/22; this is a patient I have not seen in about 7 weeks although I note he was seen about a month ago. There have been a number of appointment misses because of patient  refusals. As I understand things from our intake nurse when he came in the last time the left lateral foot had reopened. He has a worsening area on the lateral part of the left second toe. An MRI is been booked for Monday. He is still on the Bactrim DS that was prescribed last time and started on July 9. He should just about be finished that now. He comes in today with a necrotic area over the left lateral fifth metatarsal head, a deep wound on the left lateral second toe. He also has a new wound over the fibular head on the left. [place] this was new but it did have a bandage on it. Perhaps a deep tissue injury over the right fifth metatarsal head laterally. We o have been using Santyl on the left lateral foot silver alginate on the right second toe 12/07/2019 upon evaluation today patient appears to be doing about the same in regard to his wounds in general. He has been tolerating the dressing changes without complication. Fortunately there is no signs of active infection which is great news. No fevers, chills, nausea,  vomiting, or diarrhea. I did review his MRI as well it does show that he has septic arthritis at the toe and subsequently he is also having issues at this point with osteomyelitis as well as osteomyelitis in the lateral portion of his foot in the metatarsal region. 9/27; this is a patient I have not seen in over two months although I know he was seen on 8/5. His MRI show osteomyelitis in the lateral foot and septic arthritis in the left 2nd toe. Surprisingly the lateral leg is closed and so is the second toe. He has received a prolonged course of antibiotics presumably Vancomycin but I'm not sure if that was all. Nor am I sure of dose or duration. He has an area on the left lateral foot and the left medical foot on the medial first met head. Electronic Signature(s) Signed: 01/30/2020 7:37:15 AM By: Linton Ham MD Entered By: Linton Ham on 01/30/2020  03:45:30 -------------------------------------------------------------------------------- Physical Exam Details Patient Name: Date of Service: [name], [name] 01/29/2020 2:15 PM Medical Record Number: [id number] Patient Account Number: [id number] Date of Birth/Sex: Treating RN: Jan 01, 1961 (59 y.o. [name] Primary Care Provider: SYSTEM, PCP Other Clinician: Referring Provider: Treating Provider/Extender: Thea Silversmith in Treatment: 36 Constitutional Sitting or standing Blood Pressure is within target range for patient.. Pulse regular and within target range for patient.Marland Kitchen Respirations regular, non-labored and within target range.. Temperature is normal and within the target range for the patient.Marland Kitchen Appears in no distress. Cardiovascular Pedal pulses palpable. Musculoskeletal No tenderness over the D IP or PIP. Psychiatric Significant cognitive impairment. Notes Wound exam; he has 2 Open areas. firstly over the left lateral foot still a necrotic wound with exposed tendon. Secondly an area over his medial first met head. surprisingly the 2nd toe is closed. No tenderness over the dip or pip. Electronic Signature(s) Signed: 01/30/2020 7:37:15 AM By: Linton Ham MD Entered By: Linton Ham on 01/30/2020 03:50:34 -------------------------------------------------------------------------------- Physician Orders Details Patient Name: Date of Service: [name], [name] 01/29/2020 2:15 PM Medical Record Number: [id number] Patient Account Number: [id number] Date of Birth/Sex: Treating RN: 01-Aug-1960 (59 y.o. [name] Primary Care Provider: SYSTEM, PCP Other Clinician: Referring Provider: Treating Provider/Extender: Thea Silversmith in Treatment: 36 Verbal / Phone Orders: No Diagnosis Coding ICD-10 Coding Code Description E11.621 Type 2 diabetes mellitus with foot ulcer L97.524 Non-pressure chronic ulcer of other part of  left foot with necrosis of bone M86.472 Chronic osteomyelitis with draining sinus, left ankle and foot E11.51 Type 2 diabetes mellitus with diabetic peripheral angiopathy without gangrene L97.521 Non-pressure chronic ulcer of other part of left foot limited to breakdown of skin L97.828 Non-pressure chronic ulcer of other part of left lower leg with other specified severity Follow-up Appointments Return appointment in 1 month. Dressing Change Frequency Change dressing every day. - all wounds Wound Cleansing Clean wound with Wound Cleanser - or normal saline Primary Wound Dressing Wound #3R Left,Lateral Foot Silver Collagen - moisten with saline Wound #7 Left T Second oe Silver Collagen - moisten with saline Wound #9 Left,Medial Foot Silver Collagen - moisten with saline Secondary Dressing Wound #3R Left,Lateral Foot Foam - foam donut Kerlix/Rolled Gauze Dry Gauze Wound #9 Left,Medial Foot Foam - foam donut Kerlix/Rolled Gauze Dry Gauze Wound #7 Left T Second oe Kerlix/Rolled Gauze Dry Gauze Off-Loading Multipodus Splint to: - both Museum/gallery curator) Signed: 01/29/2020 5:31:38 PM By: Levan Hurst RN, BSN Signed: 01/30/2020 7:37:15 AM By: Linton Ham MD  Entered By: Levan Hurst on 01/29/2020 15:57:12 -------------------------------------------------------------------------------- Problem List Details Patient Name: Date of Service: [name] [place] D. 01/29/2020 2:15 PM Medical Record Number: [id number] Patient Account Number: [id number] Date of Birth/Sex: Treating RN: 05/29/1960 (59 y.o. [name] Primary Care Provider: SYSTEM, PCP Other Clinician: Referring Provider: Treating Provider/Extender: Thea Silversmith in Treatment: 36 Active Problems ICD-10 Encounter Code Description Active Date MDM Diagnosis E11.621 Type 2 diabetes mellitus with foot ulcer 05/19/2019 No Yes L97.524 Non-pressure chronic ulcer of other part of  left foot with necrosis of bone 05/19/2019 No Yes M86.472 Chronic osteomyelitis with draining sinus, left ankle and foot 08/11/2019 No Yes E11.51 Type 2 diabetes mellitus with diabetic peripheral angiopathy without gangrene 05/19/2019 No Yes L97.521 Non-pressure chronic ulcer of other part of left foot limited to breakdown of 06/30/2019 No Yes skin L97.828 Non-pressure chronic ulcer of other part of left lower leg with other specified 11/23/2019 No Yes severity Inactive Problems ICD-10 Code Description Active Date Inactive Date L97.514 Non-pressure chronic ulcer of other part of right foot with necrosis of bone 05/19/2019 05/19/2019 Resolved Problems Electronic Signature(s) Signed: 01/30/2020 7:37:15 AM By: Linton Ham MD Previous Signature: 01/29/2020 5:31:38 PM Version By: Levan Hurst RN, BSN Entered By: Linton Ham on 01/30/2020 03:38:05 -------------------------------------------------------------------------------- Progress Note Details Patient Name: Date of Service: [name], [name] 01/29/2020 2:15 PM Medical Record Number: [id number] Patient Account Number: [id number] Date of Birth/Sex: Treating RN: Jul 03, 1960 (59 y.o. [name] Primary Care Provider: SYSTEM, PCP Other Clinician: Referring Provider: Treating Provider/Extender: Thea Silversmith in Treatment: 36 Subjective History of Present Illness (HPI) ADMISSION 05/19/2019 Mr. Vandunk is now a 59 year old man with type 2 diabetes. He was actually in this clinic in 2006 in 2008. I do not have these records. More recently I actually looked after him in primary care with [facility] up until 2016. Notable for the fact that I treated him for osteomyelitis in the right foot for an MRSA infection in the facility in 2014 or thereabouts. Gave him a prolonged course of vancomycin in the facility. My notes at the time do not list him has having significant PAD. He has severe diabetic neuropathy. The  patient apparently has had wounds on his feet since November. This includes the right second toe and the left foot at the mid part of the fifth metatarsal. They are using Santyl on the left foot I am not clear what they are using to the right toe. I do not know if there is been any imaging studies done. It does not appear that his vascular status is been rechecked. 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 The patient is on a combination of Augmentin and Cipro both for 2 weeks. The patient had his arterial studies done at [facility] yesterday I will need to research these results. We are using silver alginate on the left lateral foot and on the right second toe 2/12; he should be completing his antibiotics Augmentin and Cipro. Unfortunately had his arterial studies done at radiology at [facility] they did not do right ABI or left ABIs or TBI's for that matter even though all of  these were ordered. Nevertheless on the right there was triphasic waveforms of the common femoral artery, profunda femoris SFA, popliteal artery. Triphasic posterior tibial and anterior tibial as well as peroneal arteries. On the left there was a definite drop off of the distal SFA and popliteal were monophasic. Comment made that they may need to correlate with an ABI on the left even though this was ordered. We will call up and see what the problem was there. He has wounds on the left lateral fifth metatarsal head and the right second toe. Paradoxically both of the wounds look somewhat better 2/26; patient had his arterial  studies yesterday at [facility]. These showed an ABI of 0.97 at the dorsalis pedis on the right with a TBI of 0.54. Biphasic waveforms. On the left ABI at 0.94 at the dorsalis pedis TBI at 0.43. The area on the left lateral foot appears to be better. Right second toe was closed. At discharge he was noted to have wounds on the left medial fifth and the left lateral fourth and the webspace. Both toes were tender and erythematous 3/12. The area on the left lateral foot appears to be contracting. He has areas on the left medial third and the lateral fourth. These may be friction areas from the toes pressing against each other. X-rays of the left foot did not comment osteomyelitis done at the facility 3/26 2-week follow-up. The patient arrives today with the area that we have been following on the left lateral foot superficial and looking better. However last time he was here he had new open areas on the left medial third and lateral fourth toes. The lateral fourth toe was done well however the medial third toe has exposed bone. The toe itself is erythemtous and warm 4/30; the patient's left third toe and fourth toe are both closed. The area on the left lateral foot still has some probing depth. Weeping tissue no overt infection does not probe to bone. We have been using silver alginate on all these areas The patient has what looks to be a deep tissue injury on the medial part of the left first metatarsal head there is no open area here however 5/21; the patient's problematic area on the left lateral foot is still open but with some improvement. He has new open areas on the lateral part of the left second toe and the DTI injury over the metatarsal head is open today with a very superficial wound 6/4; the patient's original area on the left lateral foot appears to have healed. He does have what appears to be a denuded old blister just distal to this on the base of the left fifth metatarsal. There is nothing  open here. There is nothing open on the left first metatarsal head lateral aspect if we are still having problems and concerns about 2 weeks ago. The only open area is on the lateral part of the left second toe 10/25/2019 upon evaluation today patient unfortunately presents with worsening of his second toe ulcer on the left. This actually is opened up, appears more erythematous, and has bone noted at the joint where there is no longer maintains alignment. When I attempted to look between the toes the distal portion of the toe actually slipped medially along the joint line. Again I think that the lateral connective tissue has deteriorated at this site. I am concerned about more extensive osteomyelitis at this point. 7/22; this is a patient I have not seen in about 7 weeks  although I note he was seen about a month ago. There have been a number of appointment misses because of patient refusals. As I understand things from our intake nurse when he came in the last time the left lateral foot had reopened. He has a worsening area on the lateral part of the left second toe. An MRI is been booked for Monday. He is still on the Bactrim DS that was prescribed last time and started on July 9. He should just about be finished that now. He comes in today with a necrotic area over the left lateral fifth metatarsal head, a deep wound on the left lateral second toe. He also has a new wound over the fibular head on the left. [place] this was new but it did have a bandage on it. Perhaps a deep tissue injury over the right fifth metatarsal head laterally. We o have been using Santyl on the left lateral foot silver alginate on the right second toe 12/07/2019 upon evaluation today patient appears to be doing about the same in regard to his wounds in general. He has been tolerating the dressing changes without complication. Fortunately there is no signs of active infection which is great news. No fevers, chills, nausea,  vomiting, or diarrhea. I did review his MRI as well it does show that he has septic arthritis at the toe and subsequently he is also having issues at this point with osteomyelitis as well as osteomyelitis in the lateral portion of his foot in the metatarsal region. 9/27; this is a patient I have not seen in over two months although I know he was seen on 8/5. His MRI show osteomyelitis in the lateral foot and septic arthritis in the left 2nd toe. Surprisingly the lateral leg is closed and so is the second toe. He has received a prolonged course of antibiotics presumably Vancomycin but I'm not sure if that was all. Nor am I sure of dose or duration. He has an area on the left lateral foot and the left medical foot on the medial first met head. Objective Constitutional Sitting or standing Blood Pressure is within target range for patient.. Pulse regular and within target range for patient.Marland Kitchen Respirations regular, non-labored and within target range.. Temperature is normal and within the target range for the patient.Marland Kitchen Appears in no distress. Vitals Time Taken: 2:26 PM, Height: 73 in, Weight: 207 lbs, BMI: 27.3, Temperature: 98.4 F, Pulse: 78 bpm, Respiratory Rate: 18 breaths/min, Blood Pressure: 123/77 mmHg. Cardiovascular Pedal pulses palpable. Musculoskeletal No tenderness over the D IP or PIP. Psychiatric Significant cognitive impairment. General Notes: Wound exam; he has 2 Open areas. firstly over the left lateral foot still a necrotic wound with exposed tendon. Secondly an area over his medial first met head. surprisingly the 2nd toe is closed. No tenderness over the dip or pip. Integumentary (Hair, Skin) Wound #3R status is Open. Original cause of wound was Gradually Appeared. The wound is located on the Left,Lateral Foot. The wound measures 2cm length x 0.9cm width x 0.7cm depth; 1.414cm^2 area and 0.99cm^3 volume. There is Fat Layer (Subcutaneous Tissue) exposed. There is no tunneling or  undermining noted. There is a medium amount of serosanguineous drainage noted. The wound margin is distinct with the outline attached to the wound base. There is no granulation within the wound bed. There is a large (67-100%) amount of necrotic tissue within the wound bed including Adherent Slough. Wound #7 status is Open. Original cause of wound was Gradually Appeared.  The wound is located on the Left T Second. The wound measures 0cm length x oe 0cm width x 0cm depth; 0cm^2 area and 0cm^3 volume. There is no tunneling or undermining noted. There is a none present amount of drainage noted. The wound margin is flat and intact. There is no granulation within the wound bed. There is no necrotic tissue within the wound bed. Wound #8 status is Open. Original cause of wound was Pressure Injury. The wound is located on the Left,Proximal,Lateral Lower Leg. The wound measures 0cm length x 0cm width x 0cm depth; 0cm^2 area and 0cm^3 volume. There is no tunneling or undermining noted. There is a none present amount of drainage noted. The wound margin is flat and intact. There is no granulation within the wound bed. There is no necrotic tissue within the wound bed. Wound #8 status is Healed - Epithelialized. Original cause of wound was Pressure Injury. The wound is located on the Left,Proximal,Lateral Lower Leg. The wound measures 0cm length x 0cm width x 0cm depth; 0cm^2 area and 0cm^3 volume. There is no tunneling or undermining noted. There is a none present amount of drainage noted. The wound margin is flat and intact. There is no granulation within the wound bed. There is no necrotic tissue within the wound bed. Wound #9 status is Open. Original cause of wound was Pressure Injury. The wound is located on the Left,Medial Foot. The wound measures 0.5cm length x 0.8cm width x 0.2cm depth; 0.314cm^2 area and 0.063cm^3 volume. There is Fat Layer (Subcutaneous Tissue) exposed. There is no tunneling or  undermining noted. There is a medium amount of serosanguineous drainage noted. There is medium (34-66%) pink granulation within the wound bed. There is a medium (34- 66%) amount of necrotic tissue within the wound bed including Adherent Slough. 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 Dressing Change Frequency: Change dressing every day. - all wounds Wound Cleansing: Clean wound with Wound Cleanser - or normal saline Primary Wound Dressing: Wound #3R Left,Lateral Foot: Silver Collagen - moisten with saline Wound #7 Left T Second: oe Silver Collagen - moisten with saline Wound #9 Left,Medial Foot: Silver Collagen - moisten with saline Secondary Dressing: Wound #3R Left,Lateral Foot: Foam - foam donut Kerlix/Rolled Gauze Dry Gauze Wound #9 Left,Medial Foot: Foam - foam donut Kerlix/Rolled Gauze Dry Gauze Wound #7 Left T Second: oe Kerlix/Rolled Gauze Dry Gauze Off-Loading: Multipodus Splint to: - both feet 1 suspect he still has active osteomyelitis in the left lateral foot 2 Exxposed tendon. 3 change dressing in both areas to silveer collagen 4 It was suggested consideraton of BKA in this non amblatory man and was oreferred to Dr. Doran Durand however I don't know if this actually occured. 5 I am also not sure of the the exact duration of antibiotics he received or if anythng other than vancomycin was used. These infections are often polymicrobial....Marland KitchenMarland Kitchen 6 He is not competent to make complicated medical  decisions. not sure who POA is. At one point this was his wife. I'll nedd to look into this especially if this deteriorates Electronic Signature(s)  Signed: 01/30/2020 7:37:15 AM By: Linton Ham MD Entered By: Linton Ham on 01/30/2020 03:55:14 -------------------------------------------------------------------------------- SuperBill Details Patient Name: Date of Service: [name], [name] 01/29/2020 Medical Record Number: [id number] Patient Account Number: [id number] Date of Birth/Sex: Treating RN: March 06, 1961 (59 y.o. 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, left ankle and foot E11.51 Type 2 diabetes mellitus with diabetic peripheral angiopathy without gangrene L97.521 Non-pressure chronic ulcer of other part of left foot limited to breakdown of skin L97.828 Non-pressure chronic ulcer of other part of left lower leg with other specified severity Facility Procedures CPT4 Code: 55208022 Description: 99214 - WOUND CARE VISIT-LEV 4 EST PT Modifier: Quantity: 1 Physician Procedures : CPT4 Code Description Modifier [id number] - WC PHYS LEVEL 4 - EST PT ICD-10 Diagnosis Description E11.621 Type 2 diabetes mellitus with foot ulcer M86.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

## 2020-02-19 ENCOUNTER — Other Ambulatory Visit: Payer: Self-pay | Admitting: *Deleted

## 2020-02-19 DIAGNOSIS — S81809A Unspecified open wound, unspecified lower leg, initial encounter: Secondary | ICD-10-CM

## 2020-02-21 ENCOUNTER — Other Ambulatory Visit: Payer: Self-pay

## 2020-02-21 DIAGNOSIS — S81809A Unspecified open wound, unspecified lower leg, initial encounter: Secondary | ICD-10-CM

## 2020-02-26 ENCOUNTER — Other Ambulatory Visit (HOSPITAL_COMMUNITY)
Admission: RE | Admit: 2020-02-26 | Discharge: 2020-02-26 | Disposition: A | Discharge status: Home / Self Care | Payer: Medicare Other | Source: Other Acute Inpatient Hospital | Attending: Internal Medicine | Admitting: Internal Medicine

## 2020-02-26 ENCOUNTER — Encounter (HOSPITAL_BASED_OUTPATIENT_CLINIC_OR_DEPARTMENT_OTHER): Payer: Medicare Other | Attending: Internal Medicine | Admitting: Internal Medicine

## 2020-02-26 ENCOUNTER — Other Ambulatory Visit: Payer: Self-pay

## 2020-02-26 DIAGNOSIS — E1151 Type 2 diabetes mellitus with diabetic peripheral angiopathy without gangrene: Secondary | ICD-10-CM | POA: Diagnosis not present

## 2020-02-26 DIAGNOSIS — L97518 Non-pressure chronic ulcer of other part of right foot with other specified severity: Secondary | ICD-10-CM | POA: Insufficient documentation

## 2020-02-26 DIAGNOSIS — E11621 Type 2 diabetes mellitus with foot ulcer: Secondary | ICD-10-CM | POA: Insufficient documentation

## 2020-02-26 DIAGNOSIS — L97524 Non-pressure chronic ulcer of other part of left foot with necrosis of bone: Secondary | ICD-10-CM | POA: Diagnosis not present

## 2020-02-26 DIAGNOSIS — E1140 Type 2 diabetes mellitus with diabetic neuropathy, unspecified: Secondary | ICD-10-CM | POA: Insufficient documentation

## 2020-02-26 DIAGNOSIS — E1169 Type 2 diabetes mellitus with other specified complication: Secondary | ICD-10-CM | POA: Diagnosis not present

## 2020-02-26 DIAGNOSIS — M86472 Chronic osteomyelitis with draining sinus, left ankle and foot: Secondary | ICD-10-CM | POA: Insufficient documentation

## 2020-02-26 NOTE — Progress Notes (Addendum)
[name] ([id number]) Visit Report for 02/26/2020 Arrival Information Details Patient Name: Date of Service: Frank Bean Chical D. 02/26/2020 10:30 A M Medical Record Number: [id number] Patient Account Number: [id number] Date of Birth/Sex: Treating RN: 1961/03/27 (59 y.o. [name] Primary Care [name]: PCP, NO Other Clinician: Referring [name]: Treating [name]/Extender: [name] in Treatment: 40 Visit Information History Since Last Visit Added or deleted any medications: No Patient Arrived: Wheel Chair Any new allergies or adverse reactions: No Arrival Time: 10:21 Had a fall or experienced change in No Accompanied By: facility staff activities of daily living that may affect Transfer Assistance: None risk of falls: Patient Requires Transmission-Based Precautions: No Signs or symptoms of abuse/neglect since last visito No Patient Has Alerts: Yes Hospitalized since last visit: Yes Patient Alerts: R and L ABIs N/C Implantable device outside of the clinic excluding No cellular tissue based products placed in the center since last visit: Has Dressing in Place as Prescribed: Yes Has Footwear/Offloading in Place as Prescribed: Yes Left: Multipodus Split/Boot Right: Multipodus Split/Boot Pain Present Now: No Notes stayed in [employer]) Signed: 02/26/2020 5:06:51 PM By: Zenaida Deed RN, BSN Entered By: Zenaida Deed on 02/26/2020 10:25:15 -------------------------------------------------------------------------------- Clinic Level of Care Assessment Details Patient Name: Date of Service: [name], [name] EN D. 02/26/2020 10:30 A M Medical Record Number: [id number] Patient Account Number: [id number] Date of Birth/Sex: Treating RN: 26-Jan-1961 (59 y.o. [name] Primary Care [name]: PCP, NO Other Clinician: Referring [name]: Treating [name]/Extender: [name] in Treatment:  40 Clinic Level of Care Assessment Items TOOL 4 Quantity Score X- 1 0 Use when only an EandM is performed on FOLLOW-UP visit ASSESSMENTS - Nursing Assessment / Reassessment X- 1 10 Reassessment of Co-morbidities (includes updates in patient status) X- 1 5 Reassessment of Adherence to Treatment Plan ASSESSMENTS - Wound and Skin A ssessment / Reassessment []  - 0 Simple Wound Assessment / Reassessment - one wound X- 4 5 Complex Wound Assessment / Reassessment - multiple wounds []  - 0 Dermatologic / Skin Assessment (not related to wound area) ASSESSMENTS - Focused Assessment []  - 0 Circumferential Edema Measurements - multi extremities []  - 0 Nutritional Assessment / Counseling / Intervention X- 1 5 Lower Extremity Assessment (monofilament, tuning fork, pulses) []  - 0 Peripheral Arterial Disease Assessment (using hand held doppler) ASSESSMENTS - Ostomy and/or Continence Assessment and Care []  - 0 Incontinence Assessment and Management []  - 0 Ostomy Care Assessment and Management (repouching, etc.) PROCESS - Coordination of Care X - Simple Patient / Family Education for ongoing care 1 15 []  - 0 Complex (extensive) Patient / Family Education for ongoing care X- 1 10 Staff obtains , Records, T Results / Process Orders est X- 1 10 Staff telephones HHA, Nursing Homes / Clarify orders / etc []  - 0 Routine Transfer to another Facility (non-emergent condition) []  - 0 Routine Hospital Admission (non-emergent condition) []  - 0 New Admissions / / Ordering NPWT Apligraf, etc. , []  - 0 Emergency Hospital Admission (emergent condition) X- 1 10 Simple Discharge Coordination []  - 0 Complex (extensive) Discharge Coordination PROCESS - Special Needs []  - 0 Pediatric / Minor Patient Management []  - 0 Isolation Patient Management []  - 0 Hearing / Language / Visual special needs []  - 0 Assessment of Community assistance (transportation, D/C  planning, etc.) []  - 0 Additional assistance / Altered mentation []  - 0 Support Surface(s) Assessment (bed, cushion, seat, etc.) INTERVENTIONS - Wound Cleansing /  Measurement []  - 0 Simple Wound Cleansing - one wound X- 4 5 Complex Wound Cleansing - multiple wounds X- 1 5 Wound Imaging (photographs - any number of wounds) []  - 0 Wound Tracing (instead of photographs) []  - 0 Simple Wound Measurement - one wound X- 4 5 Complex Wound Measurement - multiple wounds INTERVENTIONS - Wound Dressings []  - 0 Small Wound Dressing one or multiple wounds X- 1 15 Medium Wound Dressing one or multiple wounds []  - 0 Large Wound Dressing one or multiple wounds X- 1 5 Application of Medications - topical []  - 0 Application of Medications - injection INTERVENTIONS - Miscellaneous []  - 0 External ear exam X- 1 5 Specimen Collection (cultures, biopsies, blood, body fluids, etc.) X- 1 5 Specimen(s) / Culture(s) sent or taken to Lab for analysis []  - 0 Patient Transfer (multiple staff / Nurse, adultHoyer Lift / Similar devices) []  - 0 Simple Staple / Suture removal (25 or less) []  - 0 Complex Staple / Suture removal (26 or more) []  - 0 Hypo / Hyperglycemic Management (close monitor of Blood Glucose) []  - 0 Ankle / Brachial Index (ABI) - do not check if billed separately X- 1 5 Vital Signs Has the patient been seen at the hospital within the last three years: Yes Total Score: 165 Level Of Care: New/Established - Level 5 Electronic Signature(s) Signed: 02/26/2020 5:02:41 PM By: Zandra AbtsLynch, Shatara RN, BSN Entered By: Zandra AbtsLynch, Shatara on 02/26/2020 15:00:04 -------------------------------------------------------------------------------- Encounter Discharge Information Details Patient Name: Date of Service: [name], Frank SavoySTEV EN D. 02/26/2020 10:30 A M Medical Record Number: [id number] Patient Account Number: [id number] Date of Birth/Sex: Treating RN: [birth year] (59 y.o. [name]) [name], Frank Primary Care  [name]: PCP, NO Other Clinician: Referring [name]: Treating [name]/Extender: Frank Barbanobson, [name], [name] in Treatment: 40 Encounter Discharge Information Items Discharge Condition: Stable Ambulatory Status: Wheelchair Discharge Destination: Skilled Nursing Facility Telephoned: No Orders Sent: Yes Transportation: Private Auto Accompanied By: caregiver Schedule Follow-up Appointment: Yes Clinical Summary of Care: Electronic Signature(s) Signed: 02/26/2020 5:04:57 PM By: Shawn Stalleaton, Frank Entered By: Shawn Stalleaton, Frank on 02/26/2020 12:37:05 -------------------------------------------------------------------------------- Lower Extremity Assessment Details Patient Name: Date of Service: Frank BeanJO [name], STEV EN D. 02/26/2020 10:30 A M Medical Record Number: [id number] Patient Account Number: [id number] Date of Birth/Sex: Treating RN: [birth year] (59 y.o. Frank SchoonerM) [name], Frank Primary Care Hilliard Borges: PCP, NO Other Clinician: Referring Cooper Moroney: Treating Aldena Worm/Extender: Frank Barbanobson, [name], [name] in Treatment: 40 Edema Assessment Assessed: Kyra Searles[Left: No] Franne Forts[Right: No] Edema: [Left: No] [Right: No] Calf Left: Right: Point of Measurement: From Medial Instep 29 cm Ankle Left: Right: Point of Measurement: From Medial Instep 19.1 cm 18.6 cm Vascular Assessment Pulses: Dorsalis Pedis Palpable: [Left:No] [Right:Yes] Electronic Signature(s) Signed: 02/26/2020 5:06:51 PM By: Zenaida DeedBoehlein, Linda RN, BSN Entered By: Zenaida DeedBoehlein, Frank on 02/26/2020 10:58:22 -------------------------------------------------------------------------------- Multi Wound Chart Details Patient Name: Date of Service: [name], Frank SavoySTEV EN D. 02/26/2020 10:30 A M Medical Record Number: [id number] Patient Account Number: [id number] Date of Birth/Sex: Treating RN: [birth year] (59 y.o. Frank KollerM) Lynch, Shatara Primary Care Skii Cleland: PCP, NO Other Clinician: Referring Mikala Podoll: Treating Ewald Beg/Extender: Frank Barbanobson, [name],  [name] in Treatment: 40 Vital Signs Height(in): 73 Pulse(bpm): 87 Weight(lbs): 207 Blood Pressure(mmHg): 115/72 Body Mass Index(BMI): 27 Temperature(F): 98.9 Respiratory Rate(breaths/min): 18 Photos: [10:No Photos Right T Fourth oe] [11:No Photos Left T Great oe] [3R:No Photos Left, Lateral Foot] Wound Location: [10:Gradually Appeared] [11:Gradually Appeared] [3R:Gradually Appeared] Wounding Event: [10:Diabetic Wound/Ulcer of the Lower] [11:Diabetic Wound/Ulcer of the Lower Diabetic Wound/Ulcer of the Lower] Primary Etiology: [10:Extremity Glaucoma,  Coronary Artery Disease,] [11:Extremity Glaucoma, Coronary Artery Disease, Glaucoma, Coronary Artery Disease,] [3R:Extremity] Comorbid History: [10:Deep Vein Thrombosis, Hypertension, Peripheral Arterial Disease, Type II Diabetes, Dementia, Neuropathy, Seizure Disorder 02/26/2020] [11:Deep Vein Thrombosis, Hypertension,Deep Vein Thrombosis, Hypertension, Peripheral Arterial  Disease, Type II Diabetes, Dementia, Neuropathy, Seizure Disorder 02/26/2020] [3R:Diabetes, Dementia, Neuropathy, Seizure Disorder 03/05/2019] Date Acquired: [10:0] [11:0] [3R:40] [name] of Treatment: [10:Open] [11:Open] [3R:Open] Wound Status: [10:No] [11:No] [3R:Yes] Wound Recurrence: [10:0.4x0.4x0.1] [11:1.6x1.6x0.7] [3R:1.6x0.5x0.5] Measurements L x W x D (cm) [10:0.126] [11:2.011] [3R:0.628] A (cm) : rea [10:0.013] [11:1.407] [3R:0.314] Volume (cm) : [10:N/A] [11:N/A] [3R:78.60%] % Reduction in A rea: [10:N/A] [11:N/A] [3R:94.10%] % Reduction in Volume: [3R:6] Position 1 (o'clock): [3R:0.5] Maximum Distance 1 (cm): [10:No] [11:No] [3R:Yes] Tunneling: [10:Grade 1] [11:Grade 4] [3R:Grade 2] Classification: [10:N/A] [11:Other] [3R:N/A] Wagner Verification: [10:Small] [11:Medium] [3R:Medium] Exudate A mount: [10:Serosanguineous] [11:Purulent] [3R:Serosanguineous] Exudate Type: [10:red, brown] [11:yellow, brown, green] [3R:red, brown] Exudate Color: [10:Flat  and Intact] [11:Well defined, not attached] [3R:Well defined, not attached] Wound Margin: [10:Medium (34-66%)] [11:Medium (34-66%)] [3R:Medium (34-66%)] Granulation A mount: [10:Red] [11:Red] [3R:Red, Friable] Granulation Quality: [10:None Present (0%)] [11:Medium (34-66%)] [3R:Medium (34-66%)] Necrotic A mount: [10:N/A] [11:Eschar, Adherent Slough] [3R:Adherent Slough] Necrotic Tissue: [10:Fat Layer (Subcutaneous Tissue): Yes Fat Layer (Subcutaneous Tissue): Yes Fat Layer (Subcutaneous Tissue): Yes] Exposed Structures: [10:Fascia: No Tendon: No Muscle: No Joint: No Bone: No Medium (34-66%)] [11:Bone: Yes Fascia: No Tendon: No Muscle: No Joint: No None] [3R:Tendon: Yes Fascia: No Muscle: No Joint: No Bone: No Small (1-33%)] Epithelialization: [10:N/A] [11:piece of bone came out of tip of toe] [3R:N/A] Assessment Notes: [11:with cleaning 7 9] [3R:N/A] Photos: [10:No Photos Left T Second oe] [11:No Photos Left, Medial Foot] [3R:N/A N/A] Wound Location: [10:Gradually Appeared] [11:Pressure Injury] [3R:N/A] Wounding Event: [10:Diabetic Wound/Ulcer of the Lower] [11:Pressure Ulcer] [3R:N/A] Primary Etiology: [10:Extremity N/A] [11:Glaucoma, Coronary Artery Disease, N/A] Comorbid History: [10:09/22/2019] [11:Deep Vein Thrombosis, Hypertension, Peripheral Arterial Disease, Type II Diabetes, Dementia, Neuropathy, Seizure Disorder 01/29/2020] [3R:N/A] Date Acquired: [10:22] [11:4] [3R:N/A] [name] of Treatment: [10:Healed - Epithelialized] [11:Open] [3R:N/A] Wound Status: [10:No] [11:No] [3R:N/A] Wound Recurrence: [10:0x0x0] [11:2.5x2.5x0.4] [3R:N/A] Measurements L x W x D (cm) [10:0] [11:4.909] [3R:N/A] A (cm) : rea [10:0] [11:1.963] [3R:N/A] Volume (cm) : [10:100.00%] [11:-1463.40%] [3R:N/A] % Reduction in A rea: [10:100.00%] [11:-3015.90%] [3R:N/A] % Reduction in Volume: [10:N/A] [11:No] [3R:N/A] Tunneling: [10:Grade 2] [11:Category/Stage IV] [3R:N/A] Classification: [10:N/A] [11:N/A]  [3R:N/A] Wagner Verification: [10:N/A] [11:Medium] [3R:N/A] Exudate A mount: [10:N/A] [11:Serosanguineous] [3R:N/A] Exudate Type: [10:N/A] [11:red, brown] [3R:N/A] Exudate Color: [10:N/A] [11:Flat and Intact] [3R:N/A] Wound Margin: [10:N/A] [11:None Present (0%)] [3R:N/A] Granulation A mount: [10:N/A] [11:N/A] [3R:N/A] Granulation Quality: [10:N/A] [11:Large (67-100%)] [3R:N/A] Necrotic A mount: [10:N/A] [11:Eschar, Adherent Slough] [3R:N/A] Necrotic Tissue: [10:N/A] [11:Fat Layer (Subcutaneous Tissue): Yes N/A] Exposed Structures: [10:N/A] [11:Bone: Yes Fascia: No Tendon: No Muscle: No Joint: No None] [3R:N/A] Epithelialization: [10:N/A] [11:N/A] [3R:N/A] Treatment Notes Wound #10 (Right Toe Fourth) 1. Cleanse With Wound Cleanser 3. Primary Dressing Applied Calcium Alginate Ag 4. Secondary Dressing Dry Gauze Roll Gauze 5. Secured With Medipore tape 7. Footwear/Offloading device applied Multipodus Splint/Boot Notes netting. Wound #11 (Left Toe Great) 1. Cleanse With Wound Cleanser 3. Primary Dressing Applied Calcium Alginate Ag 4. Secondary Dressing Dry Gauze Roll Gauze 5. Secured With Medipore tape 7. Footwear/Offloading device applied Multipodus Splint/Boot Notes netting. Wound #3R (Left, Lateral Foot) 1. Cleanse With Wound Cleanser 3. Primary Dressing Applied Calcium Alginate Ag 4. Secondary Dressing Dry Gauze Roll Gauze Foam 5. Secured With Medipore tape Notes foam donut as secondary. Wound #9 (Left, Medial Foot)  1. Cleanse With Wound Cleanser 3. Primary Dressing Applied Calcium Alginate Ag 4. Secondary Dressing Dry Gauze Roll Gauze Foam 5. Secured With Medipore tape Notes foam donut as secondary. Electronic Signature(s) Signed: 02/26/2020 5:02:41 PM By: Zandra Abts RN, BSN Signed: 02/26/2020 5:10:17 PM By: Baltazar Najjar MD Entered By: Baltazar Najjar on 02/26/2020  13:16:22 -------------------------------------------------------------------------------- Multi-Disciplinary Care Plan Details Patient Name: Date of Service: [name], [name] EN D. 02/26/2020 10:30 A M Medical Record Number: [id number] Patient Account Number: [id number] Date of Birth/Sex: Treating RN: [birth year] (59 y.o. [name] Primary Care Chesnie Capell: PCP, NO Other Clinician: Referring Shonica Weier: Treating Cadience Bradfield/Extender: [name] in Treatment: 40 Active Inactive Electronic Signature(s) Signed: 03/21/2020 10:48:14 AM By: Zandra Abts RN, BSN Previous Signature: 02/26/2020 5:02:41 PM Version By: Zandra Abts RN, BSN Entered By: Zandra Abts on 03/08/2020 14:42:07 -------------------------------------------------------------------------------- Pain Assessment Details Patient Name: Date of Service: [name], [name] EN D. 02/26/2020 10:30 A M Medical Record Number: [id number] Patient Account Number: [id number] Date of Birth/Sex: Treating RN: Dec 06, 1960 (59 y.o. [name] Primary Care Shahir Karen: PCP, NO Other Clinician: Referring Kassey Laforest: Treating Londan Coplen/Extender: [name] in Treatment: 40 Active Problems Location of Pain Severity and Description of Pain Patient Has Paino No Site Locations Rate the pain. Current Pain Level: 0 Pain Management and Medication Current Pain Management: Electronic Signature(s) Signed: 02/26/2020 5:06:51 PM By: Zenaida Deed RN, BSN Entered By: Zenaida Deed on 02/26/2020 10:27:52 -------------------------------------------------------------------------------- Patient/Caregiver Education Details Patient Name: Date of Service: Frank Bean EN D. 10/25/2021andnbsp10:30 A M Medical Record Number: [id number] Patient Account Number: [id number] Date of Birth/Gender: Treating RN: [birth year] (59 y.o. [name] Primary Care Physician: PCP, NO Other Clinician: Referring  Physician: Treating Physician/Extender: [name] in Treatment: 40 Education Assessment Education Provided To: Patient Education Topics Provided Wound/Skin Impairment: Methods: Explain/Verbal Responses: State content correctly [employer]) Signed: 02/26/2020 5:02:41 PM By: Zandra Abts RN, BSN Entered By: Zandra Abts on 02/26/2020 14:58:44 -------------------------------------------------------------------------------- Wound Assessment Details Patient Name: Date of Service: [name], [name] EN D. 02/26/2020 10:30 A M Medical Record Number: [id number] Patient Account Number: [id number] Date of Birth/Sex: Treating RN: 1960-05-29 (59 y.o. [name] Primary Care Ruthel Martine: PCP, NO Other Clinician: Referring Kordae Buonocore: Treating Kamry Faraci/Extender: [name] in Treatment: 40 Wound Status Wound Number: 10 Primary Diabetic Wound/Ulcer of the Lower Extremity Etiology: Wound Location: Right T Fourth oe Wound Open Wounding Event: Gradually Appeared Status: Date Acquired: 02/26/2020 Comorbid Glaucoma, Coronary Artery Disease, Deep Vein Thrombosis, [name] Of Treatment: 0 History: Hypertension, Peripheral Arterial Disease, Type II Diabetes, Clustered Wound: No Dementia, Neuropathy, Seizure Disorder Photos Photo Uploaded By: Benjaman Kindler on 02/28/2020 13:28:19 Wound Measurements Length: (cm) 0.4 Width: (cm) 0.4 Depth: (cm) 0.1 Area: (cm) 0.126 Volume: (cm) 0.013 % Reduction in Area: % Reduction in Volume: Epithelialization: Medium (34-66%) Tunneling: No Undermining: No Wound Description Classification: Grade 1 Wound Margin: Flat and Intact Exudate Amount: Small Exudate Type: Serosanguineous Exudate Color: red, brown Foul Odor After Cleansing: No Slough/Fibrino No Wound Bed Granulation Amount: Medium (34-66%) Exposed Structure Granulation Quality: Red Fascia Exposed: No Necrotic Amount: None  Present (0%) Fat Layer (Subcutaneous Tissue) Exposed: Yes Tendon Exposed: No Muscle Exposed: No Joint Exposed: No Bone Exposed: No Electronic Signature(s) Signed: 02/26/2020 5:06:51 PM By: Zenaida Deed RN, BSN Entered By: Zenaida Deed on 02/26/2020 10:52:26 -------------------------------------------------------------------------------- Wound Assessment Details Patient Name: Date of Service: [name], [name] EN D. 02/26/2020 10:30 A M Medical Record Number: [id number] Patient Account  Number: [id number] Date of Birth/Sex: Treating RN: Sep 16, 1960 (59 y.o. [name] Primary Care Menna Abeln: PCP, NO Other Clinician: Referring Makaria Poarch: Treating Papa Piercefield/Extender: [name] in Treatment: 40 Wound Status Wound Number: 11 Primary Diabetic Wound/Ulcer of the Lower Extremity Etiology: Wound Location: Left T Great oe Wound Open Wounding Event: Gradually Appeared Status: Date Acquired: 02/26/2020 Comorbid Glaucoma, Coronary Artery Disease, Deep Vein Thrombosis, [name] Of Treatment: 0 History: Hypertension, Peripheral Arterial Disease, Type II Diabetes, Clustered Wound: No Dementia, Neuropathy, Seizure Disorder Photos Photo Uploaded By: Benjaman Kindler on 02/28/2020 13:28:20 Wound Measurements Length: (cm) 1.6 Width: (cm) 1.6 Depth: (cm) 0.7 Area: (cm) 2.011 Volume: (cm) 1.407 % Reduction in Area: % Reduction in Volume: Epithelialization: None Tunneling: No Undermining: No Wound Description Classification: Grade 4 Wagner Verification: Other Wound Margin: Well defined, not attached Method: observation Rationale: eschar, necrotic bone Exudate Amount: Medium Exudate Type: Purulent Exudate Color: yellow, brown, green Foul Odor After Cleansing: No Slough/Fibrino Yes Wound Bed Granulation Amount: Medium (34-66%) Exposed Structure Granulation Quality: Red Fascia Exposed: No Necrotic Amount: Medium (34-66%) Fat Layer (Subcutaneous Tissue)  Exposed: Yes Necrotic Quality: Eschar, Adherent Slough Tendon Exposed: No Muscle Exposed: No Joint Exposed: No Bone Exposed: Yes Assessment Notes piece of bone came out of tip of toe with cleaning Electronic Signature(s) Signed: 02/26/2020 5:06:51 PM By: Zenaida Deed RN, BSN Entered By: Zenaida Deed on 02/26/2020 10:57:17 -------------------------------------------------------------------------------- Wound Assessment Details Patient Name: Date of Service: [name], [name] EN D. 02/26/2020 10:30 A M Medical Record Number: [id number] Patient Account Number: [id number] Date of Birth/Sex: Treating RN: [birth year] (59 y.o. [name] Primary Care Kaleesi Guyton: PCP, NO Other Clinician: Referring Bohdi Leeds: Treating Jadesola Poynter/Extender: [name] in Treatment: 40 Wound Status Wound Number: 3R Primary Diabetic Wound/Ulcer of the Lower Extremity Etiology: Wound Location: Left, Lateral Foot Wound Open Wounding Event: Gradually Appeared Status: Date Acquired: 03/05/2019 Comorbid Glaucoma, Coronary Artery Disease, Deep Vein Thrombosis, [name] Of Treatment: 40 History: Hypertension, Peripheral Arterial Disease, Type II Diabetes, Clustered Wound: No Dementia, Neuropathy, Seizure Disorder Photos Photo Uploaded By: Benjaman Kindler on 02/28/2020 13:28:46 Wound Measurements Length: (cm) 1.6 Width: (cm) 0.5 Depth: (cm) 0.5 Area: (cm) 0.628 Volume: (cm) 0.314 % Reduction in Area: 78.6% % Reduction in Volume: 94.1% Epithelialization: Small (1-33%) Tunneling: Yes Position (o'clock): 6 Maximum Distance: (cm) 0.5 Undermining: No Wound Description Classification: Grade 2 Wound Margin: Well defined, not attached Exudate Amount: Medium Exudate Type: Serosanguineous Exudate Color: red, brown Foul Odor After Cleansing: No Slough/Fibrino Yes Wound Bed Granulation Amount: Medium (34-66%) Exposed Structure Granulation Quality: Red, Friable Fascia Exposed:  No Necrotic Amount: Medium (34-66%) Fat Layer (Subcutaneous Tissue) Exposed: Yes Necrotic Quality: Adherent Slough Tendon Exposed: Yes Muscle Exposed: No Joint Exposed: No Bone Exposed: No Electronic Signature(s) Signed: 02/26/2020 5:06:51 PM By: Zenaida Deed RN, BSN Entered By: Zenaida Deed on 02/26/2020 10:54:48 -------------------------------------------------------------------------------- Wound Assessment Details Patient Name: Date of Service: [name], [name] EN D. 02/26/2020 10:30 A M Medical Record Number: [id number] Patient Account Number: [id number] Date of Birth/Sex: Treating RN: [birth year] (59 y.o. [name] Primary Care Averey Trompeter: PCP, NO Other Clinician: Referring Majestic Molony: Treating Micalah Cabezas/Extender: [name] in Treatment: 40 Wound Status Wound Number: 7 Primary Etiology: Diabetic Wound/Ulcer of the Lower Extremity Wound Location: Left T Second oe Wound Status: Healed - Epithelialized Wounding Event: Gradually Appeared Date Acquired: 09/22/2019 [name] Of Treatment: 22 Clustered Wound: No Photos Photo Uploaded By: Benjaman Kindler on 02/28/2020 13:29:05 Wound Measurements Length: (cm) 0 Width: (cm) 0 Depth: (  cm) 0 Area: (cm) 0 Volume: (cm) 0 % Reduction in Area: 100% % Reduction in Volume: 100% Wound Description Classification: Grade 2 Electronic Signature(s) Signed: 02/26/2020 5:06:51 PM By: Zenaida Deed RN, BSN Entered By: Zenaida Deed on 02/26/2020 10:52:52 -------------------------------------------------------------------------------- Wound Assessment Details Patient Name: Date of Service: [name], [name] EN D. 02/26/2020 10:30 A M Medical Record Number: [id number] Patient Account Number: [id number] Date of Birth/Sex: Treating RN: [birth year] (59 y.o. [name] Primary Care Jennae Hakeem: PCP, NO Other Clinician: Referring Naija Troost: Treating Aaisha Sliter/Extender: [name] in  Treatment: 40 Wound Status Wound Number: 9 Primary Pressure Ulcer Etiology: Wound Location: Left, Medial Foot Wound Open Wounding Event: Pressure Injury Status: Status: Date Acquired: 01/29/2020 Comorbid Glaucoma, Coronary Artery Disease, Deep Vein Thrombosis, [name] Of Treatment: 4 History: Hypertension, Peripheral Arterial Disease, Type II Diabetes, Clustered Wound: No Dementia, Neuropathy, Seizure Disorder Photos Photo Uploaded By: Benjaman Kindler on 02/28/2020 13:28:47 Wound Measurements Length: (cm) 2.5 Width: (cm) 2.5 Depth: (cm) 0.4 Area: (cm) 4.909 Volume: (cm) 1.963 % Reduction in Area: -1463.4% % Reduction in Volume: -3015.9% Epithelialization: None Tunneling: No Undermining: No Wound Description Classification: Category/Stage IV Wound Margin: Flat and Intact Exudate Amount: Medium Exudate Type: Serosanguineous Exudate Color: red, brown Foul Odor After Cleansing: No Slough/Fibrino Yes Wound Bed Granulation Amount: None Present (0%) Exposed Structure Necrotic Amount: Large (67-100%) Fascia Exposed: No Necrotic Quality: Eschar, Adherent Slough Fat Layer (Subcutaneous Tissue) Exposed: Yes Tendon Exposed: No Muscle Exposed: No Joint Exposed: No Bone Exposed: Yes Electronic Signature(s) Signed: 02/26/2020 5:06:51 PM By: Zenaida Deed RN, BSN Entered By: Zenaida Deed on 02/26/2020 10:53:43 -------------------------------------------------------------------------------- Vitals Details Patient Name: Date of Service: [name], [name] EN D. 02/26/2020 10:30 A M Medical Record Number: [id number] Patient Account Number: [id number] Date of Birth/Sex: Treating RN: 18-Oct-1960 (59 y.o. [name] Primary Care Megean Fabio: PCP, NO Other Clinician: Referring Cheveyo [place]: Treating Elleana Stillson/Extender: [name] in Treatment: 40 Vital Signs Time Taken: 10:25 Temperature (F): 98.9 Height (in): 73 Pulse (bpm): 87 Source:  Stated Respiratory Rate (breaths/min): 18 Weight (lbs): 207 Blood Pressure (mmHg): 115/72 Source: Stated Reference Range: 80 - 120 mg / dl Body Mass Index (BMI): 27.3 Electronic Signature(s) Signed: 02/26/2020 5:06:51 PM By: Zenaida Deed RN, BSN Entered By: Zenaida Deed on 02/26/2020 10:27:30

## 2020-02-26 NOTE — Progress Notes (Signed)
[name], [name] ([id number]) Visit Report for 02/26/2020 HPI Details Patient Name: Date of Service: [name] [place] D. 02/26/2020 10:30 A M Medical Record Number: [id number] Patient Account Number: [id number] Date of Birth/Sex: Treating RN: 01/05/1961 (59 y.o. [name] Primary Care Provider: PCP, NO Other Clinician: Referring Provider: Treating Provider/Extender: Thea Silversmith in Treatment: 1 History of Present Illness HPI Description: ADMISSION 05/19/2019 [name] is now a 59 year old man with type 2 diabetes. He was actually in this clinic in 2006 in 2008. I do not have these records. More recently I actually looked after him in primary care with [facility] up until 2016. Notable for the fact that I treated him for osteomyelitis in the right foot for an MRSA infection in the facility in 2014 or thereabouts. Gave him a prolonged course of vancomycin in the facility. My notes at the time do not list him has having significant PAD. He has severe diabetic neuropathy. The patient apparently has had wounds on his feet since November. This includes the right second toe and the left foot at the mid part of the fifth metatarsal. They are using Santyl on the left foot I am not clear what they are using to the right toe. I do not know if there is been any imaging studies done. It does not appear that his vascular status is been rechecked. 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 The patient is on a combination of Augmentin and Cipro both for 2 weeks.  The patient had his arterial studies done at [facility] yesterday I will need to research these results. We are using silver alginate on the left lateral foot and on the right second toe 2/12; he should be completing his antibiotics Augmentin and Cipro. Unfortunately had his arterial studies done at radiology at [facility] they did not do right ABI or left ABIs or TBI's for that matter even though all of these were ordered. Nevertheless on the right there was triphasic waveforms of the common femoral artery, profunda femoris SFA, popliteal artery. Triphasic posterior tibial and anterior tibial as well as peroneal arteries. On the left there was a definite drop off of the distal SFA and popliteal were monophasic. Comment made that they may need to correlate with an ABI on the left even though this was ordered. We will call up and see what the problem was there. He has wounds on the left lateral fifth metatarsal head and the right second toe. Paradoxically both of the wounds look somewhat better 2/26; patient had his arterial studies yesterday at [facility]. These showed an ABI of 0.97 at the dorsalis pedis on the right with a TBI of 0.54. Biphasic waveforms. On the left ABI at 0.94 at the dorsalis pedis TBI at 0.43. The area on the left lateral foot appears to be better. Right second toe was closed. At discharge he was noted to have wounds on the left medial fifth and the left lateral fourth and the webspace. Both toes were tender and erythematous 3/12. The area on the left lateral foot appears to be contracting. He has areas on the left medial third and the lateral fourth.  These may be friction areas from the toes pressing against each other. X-rays of the left foot did not comment osteomyelitis done at the facility 3/26 2-week follow-up. The patient arrives today with the area that we have been following on the left lateral foot superficial and looking better. However last time he was here he had new open  areas on the left medial third and lateral fourth toes. The lateral fourth toe was done well however the medial third toe has exposed bone. The toe itself is erythemtous and warm 4/30; the patient's left third toe and fourth toe are both closed. The area on the left lateral foot still has some probing depth. Weeping tissue no overt infection does not probe to bone. We have been using silver alginate on all these areas The patient has what looks to be a deep tissue injury on the medial part of the left first metatarsal head there is no open area here however 5/21; the patient's problematic area on the left lateral foot is still open but with some improvement. He has new open areas on the lateral part of the left second toe and the DTI injury over the metatarsal head is open today with a very superficial wound 6/4; the patient's original area on the left lateral foot appears to have healed. He does have what appears to be a denuded old blister just distal to this on the base of the left fifth metatarsal. There is nothing open here. There is nothing open on the left first metatarsal head lateral aspect if we are still having problems and concerns about 2 weeks ago. The only open area is on the lateral part of the left second toe 10/25/2019 upon evaluation today patient unfortunately presents with worsening of his second toe ulcer on the left. This actually is opened up, appears more erythematous, and has bone noted at the joint where there is no longer maintains alignment. When I attempted to look between the toes the distal portion of the toe actually slipped medially along the joint line. Again I think that the lateral connective tissue has deteriorated at this site. I am concerned about more extensive osteomyelitis at this point. 7/22; this is a patient I have not seen in about 7 weeks although I note he was seen about a month ago. There have been a number of appointment misses because of patient  refusals. As I understand things from our intake nurse when he came in the last time the left lateral foot had reopened. He has a worsening area on the lateral part of the left second toe. An MRI is been booked for Monday. He is still on the Bactrim DS that was prescribed last time and started on July 9. He should just about be finished that now. He comes in today with a necrotic area over the left lateral fifth metatarsal head, a deep wound on the left lateral second toe. He also has a new wound over the fibular head on the left. [place] this was new but it did have a bandage on it. Perhaps a deep tissue injury over the right fifth metatarsal head laterally. We o have been using Santyl on the left lateral foot silver alginate on the right second toe 12/07/2019 upon evaluation today patient appears to be doing about the same in regard to his wounds in general. He has been tolerating the dressing changes without complication. Fortunately there is no signs of active infection which is great news. No fevers, chills,  nausea, vomiting, or diarrhea. I did review his MRI as well it does show that he has septic arthritis at the toe and subsequently he is also having issues at this point with osteomyelitis as well as osteomyelitis in the lateral portion of his foot in the metatarsal region. 9/27; this is a patient I have not seen in over two months although I know he was seen on 8/5. His MRI show osteomyelitis in the lateral foot and septic arthritis in the left 2nd toe. Surprisingly the lateral leg is closed and so is the second toe. He has received a prolonged course of antibiotics presumably Vancomycin but I'm not sure if that was all. Nor am I sure of dose or duration. He has an area on the left lateral foot and the left medical foot on the medial first met head. 10/25; 1 month follow-up. The patient arrives in clinic today with purulent drainage coming out of his medial left first metatarsal head a deep wound  on the tip of the left first toe and the lateral part of the left foot at the base of the fifth metatarsal although I do not think there is any probable depth here. A specimen of the purulence coming out of the first MTP on the left was obtained. He also has a new area over the dorsal right foot. I am not sure if he is on antibiotics or not. When he was here the last time he was supposed to go see Dr. Doran Durand although I do not know that that ever took place. I can see Dr. Nona Dell notes in care everywhere. I note that he has a an appointment with Dr. Donnetta Hutching next Monday and arterial studies on Wednesday this week. Electronic Signature(s) Signed: 02/26/2020 5:10:17 PM By: Linton Ham MD Entered By: Linton Ham on 02/26/2020 13:17:51 -------------------------------------------------------------------------------- Physical Exam Details Patient Name: Date of Service: [name], [name] 02/26/2020 10:30 A M Medical Record Number: [id number] Patient Account Number: [id number] Date of Birth/Sex: Treating RN: 04-17-1961 (59 y.o. [name] Primary Care Provider: PCP, NO Other Clinician: Referring Provider: Treating Provider/Extender: Thea Silversmith in Treatment: 40 Constitutional Sitting or standing Blood Pressure is within target range for patient.. Pulse regular and within target range for patient.Marland Kitchen Respirations regular, non-labored and within target range.. Temperature is normal and within the target range for the patient.Marland Kitchen Appears in no distress. Cardiovascular Dorsalis pedis pulse faintly palpable on the left. Notes Wound exam; On the left foot he has a necrotic wound over his first metatarsal head this is covered in black eschar and pus is coming out of this. Left second toe is closed He has a deep necrotic wound over the tip of his first toe on the left this also looks infected Right fourth toe superficial wound on the dorsal aspect. A deep wound over the  lateral foot at the base of the fifth metatarsal. This does not probe to bone Electronic Signature(s) Signed: 02/26/2020 5:10:17 PM By: Linton Ham MD Entered By: Linton Ham on 02/26/2020 13:22:31 -------------------------------------------------------------------------------- Physician Orders Details Patient Name: Date of Service: [name], [name] 02/26/2020 10:30 A M Medical Record Number: [id number] Patient Account Number: [id number] Date of Birth/Sex: Treating RN: 27-Jun-1960 (59 y.o. [name] Primary Care Provider: PCP, NO Other Clinician: Referring Provider: Treating Provider/Extender: Thea Silversmith in Treatment: [phone] Verbal / Phone Orders: No Diagnosis Coding ICD-10 Coding Code Description E11.621 Type 2 diabetes mellitus with foot ulcer L97.524 Non-pressure chronic  ulcer of other part of left foot with necrosis of bone M86.472 Chronic osteomyelitis with draining sinus, left ankle and foot E11.51 Type 2 diabetes mellitus with diabetic peripheral angiopathy without gangrene L97.521 Non-pressure chronic ulcer of other part of left foot limited to breakdown of skin L97.828 Non-pressure chronic ulcer of other part of left lower leg with other specified severity Follow-up Appointments Return Appointment in 2 weeks. Dressing Change Frequency Change dressing every day. - all wounds Wound Cleansing Clean wound with Wound Cleanser - or normal saline Primary Wound Dressing Wound #10 Right T Fourth oe Calcium Alginate with Silver Wound #11 Left T Great oe Calcium Alginate with Silver Wound #3R Left,Lateral Foot Calcium Alginate with Silver Wound #9 Left,Medial Foot Calcium Alginate with Silver Secondary Dressing Wound #3R Left,Lateral Foot Foam - foam donut Kerlix/Rolled Gauze Dry Gauze Wound #9 Left,Medial Foot Foam - foam donut Kerlix/Rolled Gauze Dry Gauze Wound #10 Right T Fourth oe Kerlix/Rolled Gauze Dry Gauze Wound #11  Left T Great oe Kerlix/Rolled Gauze Dry Gauze Off-Loading Multipodus Splint to: - both feet Laboratory naerobe culture (MICRO) - left medial foot - (ICD10 E11.621 - Type 2 diabetes mellitus with foot Bacteria identified in Unspecified specimen by A ulcer) LOINC Code: 423-5 Convenience Name: Anerobic culture Electronic Signature(s) Signed: 02/26/2020 5:02:41 PM By: Levan Hurst RN, BSN Signed: 02/26/2020 5:10:17 PM By: Linton Ham MD Entered By: Levan Hurst on 02/26/2020 11:29:19 -------------------------------------------------------------------------------- Problem List Details Patient Name: Date of Service: [name], [name] 02/26/2020 10:30 A M Medical Record Number: [id number] Patient Account Number: [id number] Date of Birth/Sex: Treating RN: 11-09-1960 (59 y.o. [name] Primary Care Provider: PCP, NO Other Clinician: Referring Provider: Treating Provider/Extender: Thea Silversmith in Treatment: 51 Active Problems ICD-10 Encounter Code Description Active Date MDM Diagnosis E11.621 Type 2 diabetes mellitus with foot ulcer 05/19/2019 No Yes L97.524 Non-pressure chronic ulcer of other part of left foot with necrosis of bone 05/19/2019 No Yes M86.472 Chronic osteomyelitis with draining sinus, left ankle and foot 08/11/2019 No Yes E11.51 Type 2 diabetes mellitus with diabetic peripheral angiopathy without gangrene 05/19/2019 No Yes L97.521 Non-pressure chronic ulcer of other part of left foot limited to breakdown of 06/30/2019 No Yes skin L97.518 Non-pressure chronic ulcer of other part of right foot with other specified 02/26/2020 No Yes severity Inactive Problems ICD-10 Code Description Active Date Inactive Date L97.514 Non-pressure chronic ulcer of other part of right foot with necrosis of bone 05/19/2019 05/19/2019 L97.828 Non-pressure chronic ulcer of other part of left lower leg with other specified severity 11/23/2019  11/23/2019 Resolved Problems Electronic Signature(s) Signed: 02/26/2020 5:10:17 PM By: Linton Ham MD Entered By: Linton Ham on 02/26/2020 13:16:14 -------------------------------------------------------------------------------- Progress Note Details Patient Name: Date of Service: [name], [name] 02/26/2020 10:30 A M Medical Record Number: [id number] Patient Account Number: [id number] Date of Birth/Sex: Treating RN: 10-Jan-1961 (59 y.o. [name] Primary Care Provider: PCP, NO Other Clinician: Referring Provider: Treating Provider/Extender: Thea Silversmith in Treatment: 40 Subjective History of Present Illness (HPI) ADMISSION 05/19/2019 Mr. Stranahan is now a 59 year old man with type 2 diabetes. He was actually in this clinic in 2006 in 2008. I do not have these records. More recently I actually looked after him in primary care with [facility] up until 2016. Notable for the fact that I treated him for osteomyelitis in the right foot for an MRSA infection in the facility in 2014 or thereabouts. Gave him a prolonged course of vancomycin  in the facility. My notes at the time do not list him has having significant PAD. He has severe diabetic neuropathy. The patient apparently has had wounds on his feet since November. This includes the right second toe and the left foot at the mid part of the fifth metatarsal. They are using Santyl on the left foot I am not clear what they are using to the right toe. I do not know if there is been any imaging studies done. It does not appear that his vascular status is been rechecked. 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 The patient is on a combination of Augmentin and Cipro both for 2 weeks. The patient had his arterial studies done at [facility] yesterday I will need to research these results. We are using silver alginate on the left lateral foot and on the right second toe 2/12; he should be completing his antibiotics Augmentin and Cipro. Unfortunately had his arterial studies done at radiology at [facility] they did not do right ABI or left ABIs or TBI's for that matter even though all of these were ordered. Nevertheless on the right there was triphasic waveforms of the common femoral artery, profunda femoris SFA, popliteal artery. Triphasic posterior tibial and anterior tibial as well as peroneal arteries. On the left there was a definite drop off of the distal SFA and popliteal were monophasic. Comment made that they may need to correlate with an ABI on the left even though this was ordered. We will call up and see what the problem was there. He has wounds on the left lateral fifth metatarsal head and the right second toe. Paradoxically both of the wounds look somewhat better 2/26; patient had his arterial studies yesterday at [facility]. These showed an ABI of 0.97 at the dorsalis pedis on the right with a TBI of 0.54. Biphasic waveforms. On the left ABI at 0.94 at the dorsalis pedis TBI at 0.43. The area on the left lateral foot appears to be better. Right second toe was closed. At discharge he was noted to have wounds on the left medial fifth and the left lateral fourth and the webspace. Both toes were tender and erythematous 3/12. The area on the left lateral foot appears to be contracting. He has areas on the left medial third and the lateral fourth. These may be friction areas from the toes pressing against each other. X-rays of the left foot did not comment osteomyelitis done at the  facility 3/26 2-week follow-up. The patient arrives today with the area that we have been following on the left lateral foot superficial and looking better. However last time he was here he had new open areas on the left medial third and lateral fourth toes. The lateral fourth toe was done well however the medial third toe has exposed bone. The toe itself is erythemtous and warm 4/30; the patient's left third toe and fourth toe are both closed. The area on the left lateral foot still has some probing depth. Weeping tissue no overt infection does not probe to bone. We have been using silver alginate on all these areas The patient has what looks to be a deep tissue  injury on the medial part of the left first metatarsal head there is no open area here however 5/21; the patient's problematic area on the left lateral foot is still open but with some improvement. He has new open areas on the lateral part of the left second toe and the DTI injury over the metatarsal head is open today with a very superficial wound 6/4; the patient's original area on the left lateral foot appears to have healed. He does have what appears to be a denuded old blister just distal to this on the base of the left fifth metatarsal. There is nothing open here. There is nothing open on the left first metatarsal head lateral aspect if we are still having problems and concerns about 2 weeks ago. The only open area is on the lateral part of the left second toe 10/25/2019 upon evaluation today patient unfortunately presents with worsening of his second toe ulcer on the left. This actually is opened up, appears more erythematous, and has bone noted at the joint where there is no longer maintains alignment. When I attempted to look between the toes the distal portion of the toe actually slipped medially along the joint line. Again I think that the lateral connective tissue has deteriorated at this site. I am concerned about more extensive  osteomyelitis at this point. 7/22; this is a patient I have not seen in about 7 weeks although I note he was seen about a month ago. There have been a number of appointment misses because of patient refusals. As I understand things from our intake nurse when he came in the last time the left lateral foot had reopened. He has a worsening area on the lateral part of the left second toe. An MRI is been booked for Monday. He is still on the Bactrim DS that was prescribed last time and started on July 9. He should just about be finished that now. He comes in today with a necrotic area over the left lateral fifth metatarsal head, a deep wound on the left lateral second toe. He also has a new wound over the fibular head on the left. [place] this was new but it did have a bandage on it. Perhaps a deep tissue injury over the right fifth metatarsal head laterally. We o have been using Santyl on the left lateral foot silver alginate on the right second toe 12/07/2019 upon evaluation today patient appears to be doing about the same in regard to his wounds in general. He has been tolerating the dressing changes without complication. Fortunately there is no signs of active infection which is great news. No fevers, chills, nausea, vomiting, or diarrhea. I did review his MRI as well it does show that he has septic arthritis at the toe and subsequently he is also having issues at this point with osteomyelitis as well as osteomyelitis in the lateral portion of his foot in the metatarsal region. 9/27; this is a patient I have not seen in over two months although I know he was seen on 8/5. His MRI show osteomyelitis in the lateral foot and septic arthritis in the left 2nd toe. Surprisingly the lateral leg is closed and so is the second toe. He has received a prolonged course of antibiotics presumably Vancomycin but I'm not sure if that was all. Nor am I sure of dose or duration. He has an area on the left lateral foot and the  left medical foot on the medial first met head. 10/25;  1 month follow-up. The patient arrives in clinic today with purulent drainage coming out of his medial left first metatarsal head a deep wound on the tip of the left first toe and the lateral part of the left foot at the base of the fifth metatarsal although I do not think there is any probable depth here. A specimen of the purulence coming out of the first MTP on the left was obtained. He also has a new area over the dorsal right foot. I am not sure if he is on antibiotics or not. When he was here the last time he was supposed to go see Dr. Doran Durand although I do not know that that ever took place. I can see Dr. Nona Dell notes in care everywhere. I note that he has a an appointment with Dr. Donnetta Hutching next Monday and arterial studies on Wednesday this week. Objective Constitutional Sitting or standing Blood Pressure is within target range for patient.. Pulse regular and within target range for patient.Marland Kitchen Respirations regular, non-labored and within target range.. Temperature is normal and within the target range for the patient.Marland Kitchen Appears in no distress. Vitals Time Taken: 10:25 AM, Height: 73 in, Source: Stated, Weight: 207 lbs, Source: Stated, BMI: 27.3, Temperature: 98.9 F, Pulse: 87 bpm, Respiratory Rate: 18 breaths/min, Blood Pressure: 115/72 mmHg. Cardiovascular Dorsalis pedis pulse faintly palpable on the left. General Notes: Wound exam; ooOn the left foot he has a necrotic wound over his first metatarsal head this is covered in black eschar and pus is coming out of this. ooLeft second toe is closed ooHe has a deep necrotic wound over the tip of his first toe on the left this also looks infected ooRight fourth toe superficial wound on the dorsal aspect. ooA deep wound over the lateral foot at the base of the fifth metatarsal. This does not probe to bone Integumentary (Hair, Skin) Wound #10 status is Open. Original cause of wound was  Gradually Appeared. The wound is located on the Right T Fourth. The wound measures 0.4cm length oe x 0.4cm width x 0.1cm depth; 0.126cm^2 area and 0.013cm^3 volume. There is Fat Layer (Subcutaneous Tissue) exposed. There is no tunneling or undermining noted. There is a small amount of serosanguineous drainage noted. The wound margin is flat and intact. There is medium (34-66%) red granulation within the wound bed. There is no necrotic tissue within the wound bed. Wound #11 status is Open. Original cause of wound was Gradually Appeared. The wound is located on the Left T Great. The wound measures 1.6cm length x oe 1.6cm width x 0.7cm depth; 2.011cm^2 area and 1.407cm^3 volume. There is bone and Fat Layer (Subcutaneous Tissue) exposed. There is no tunneling or undermining noted. There is a medium amount of purulent drainage noted. The wound margin is well defined and not attached to the wound base. There is medium (34-66%) red granulation within the wound bed. There is a medium (34-66%) amount of necrotic tissue within the wound bed including Eschar and Adherent Slough. General Notes: piece of bone came out of tip of toe with cleaning Wound #3R status is Open. Original cause of wound was Gradually Appeared. The wound is located on the Left,Lateral Foot. The wound measures 1.6cm length x 0.5cm width x 0.5cm depth; 0.628cm^2 area and 0.314cm^3 volume. There is tendon and Fat Layer (Subcutaneous Tissue) exposed. There is no undermining noted, however, there is tunneling at 6:00 with a maximum distance of 0.5cm. There is a medium amount of serosanguineous drainage noted. The wound margin  is well defined and not attached to the wound base. There is medium (34-66%) red, friable granulation within the wound bed. There is a medium (34-66%) amount of necrotic tissue within the wound bed including Adherent Slough. Wound #7 status is Healed - Epithelialized. Original cause of wound was Gradually Appeared. The  wound is located on the Left T Second. The wound oe measures 0cm length x 0cm width x 0cm depth; 0cm^2 area and 0cm^3 volume. Wound #9 status is Open. Original cause of wound was Pressure Injury. The wound is located on the Left,Medial Foot. The wound measures 2.5cm length x 2.5cm width x 0.4cm depth; 4.909cm^2 area and 1.963cm^3 volume. There is bone and Fat Layer (Subcutaneous Tissue) exposed. There is no tunneling or undermining noted. There is a medium amount of serosanguineous drainage noted. The wound margin is flat and intact. There is no granulation within the wound bed. There is a large (67-100%) amount of necrotic tissue within the wound bed including Eschar and Adherent Slough. 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 Dressing Change Frequency: Change dressing every day. - all wounds Wound Cleansing: Clean wound with Wound Cleanser - or normal saline Primary Wound Dressing: Wound #10 Right T Fourth: oe Calcium Alginate with Silver Wound #11 Left T Great: oe Calcium Alginate with Silver Wound #3R Left,Lateral Foot: Calcium Alginate with Silver Wound #9 Left,Medial Foot: Calcium Alginate with Silver Secondary Dressing: Wound #3R Left,Lateral Foot: Foam - foam donut Kerlix/Rolled Gauze Dry Gauze Wound #9 Left,Medial Foot: Foam - foam donut Kerlix/Rolled Gauze Dry Gauze Wound #10 Right T Fourth: oe Kerlix/Rolled Gauze Dry Gauze Wound #11 Left T Great: oe Kerlix/Rolled Gauze Dry  Gauze Off-Loading: Multipodus Splint to: - both feet Laboratory ordered were: Anerobic culture - left medial foot 1. I have really lost track of what is going on with this man. I am not completely certain if he is on antibiotics now. He does not appear to have a PICC line. He has necrotic wound over the first met head medial aspect draining pus I have cultured this. He has necrotic wound over the tip of his left great toe and a deep probing area on the base of the fifth metatarsal which is also been a chronic wound 2. The first toe I think is new to me at least this week 3. He has an open wound over the dorsal aspect of the fourth toe 4. The patient is not feeling any pain but I think is insensate 5. I think he probably has chronic osteomyelitis which is active again in the left foot. I suspect he will need an amputation. We know he has significant PAD from previous studies we done but I do not think he is a candidate for a bypass or interventional studies. He will see Dr. Donnetta Hutching on Monday I suspect he will verify this. 6 he is previously refused an amputation however I really do not think the patient is competent to make this decision. I do not know who his current power of attorney is. At one point it was his wife Engineer, maintenance) Signed:  02/26/2020 5:10:17 PM By: Linton Ham MD Entered By: Linton Ham on 02/26/2020 13:25:25 -------------------------------------------------------------------------------- SuperBill Details Patient Name: Date of Service: [name], [name] 02/26/2020 Medical Record Number: [id number] Patient Account Number: [id number] Date of Birth/Sex: Treating RN: [birth year] (59 y.o. [name] Primary Care Provider: PCP, NO Other Clinician: Referring Provider: Treating Provider/Extender: Thea Silversmith in Treatment: 40 Diagnosis Coding ICD-10 Codes Code Description E11.621 Type 2 diabetes mellitus with foot ulcer L97.524  Non-pressure chronic ulcer of other part of left foot with necrosis of bone M86.472 Chronic osteomyelitis with draining sinus, left ankle and foot E11.51 Type 2 diabetes mellitus with diabetic peripheral angiopathy without gangrene L97.521 Non-pressure chronic ulcer of other part of left foot limited to breakdown of skin L97.518 Non-pressure chronic ulcer of other part of right foot with other specified severity Facility Procedures CPT4 Code: [id number] 9 Description: 9215 - WOUND CARE VISIT-LEV 5 EST PT Modifier: Quantity: 1 Physician Procedures : CPT4 Code Description Modifier [id number] - WC PHYS LEVEL 4 - EST PT ICD-10 Diagnosis Description L97.524 Non-pressure chronic ulcer of other part of left foot with necrosis of bone L97.521 Non-pressure chronic ulcer of other part of left foot  limited to breakdown of skin L97.518 Non-pressure chronic ulcer of other part of right foot with other specified severity M86.472 Chronic osteomyelitis with draining sinus, left ankle and foot Quantity: 1 Electronic Signature(s) Signed: 02/26/2020 5:02:41 PM By: Levan Hurst RN, BSN Signed: 02/26/2020 5:10:17 PM By: Linton Ham MD Entered By: Levan Hurst on 02/26/2020 15:00:16

## 2020-02-27 LAB — AEROBIC CULTURE  (SUPERFICIAL SPECIMEN)

## 2020-02-28 ENCOUNTER — Encounter: Payer: Medicare Other | Admitting: Vascular Surgery

## 2020-02-28 ENCOUNTER — Ambulatory Visit (HOSPITAL_COMMUNITY)
Admission: RE | Admit: 2020-02-28 | Discharge: 2020-02-28 | Disposition: A | Discharge status: Home / Self Care | Payer: Medicare Other | Source: Ambulatory Visit | Attending: Vascular Surgery | Admitting: Vascular Surgery

## 2020-02-28 ENCOUNTER — Other Ambulatory Visit: Payer: Self-pay

## 2020-02-28 ENCOUNTER — Encounter (HOSPITAL_COMMUNITY): Payer: Medicare Other

## 2020-02-28 DIAGNOSIS — S81809A Unspecified open wound, unspecified lower leg, initial encounter: Secondary | ICD-10-CM | POA: Insufficient documentation

## 2020-03-03 LAB — AEROBIC CULTURE  (SUPERFICIAL SPECIMEN)

## 2020-03-03 LAB — AEROBIC CULTURE W GRAM STAIN (SUPERFICIAL SPECIMEN)

## 2020-03-04 ENCOUNTER — Telehealth: Payer: Self-pay

## 2020-03-04 ENCOUNTER — Encounter: Payer: Self-pay | Admitting: Vascular Surgery

## 2020-03-04 ENCOUNTER — Other Ambulatory Visit: Payer: Self-pay

## 2020-03-04 ENCOUNTER — Ambulatory Visit (INDEPENDENT_AMBULATORY_CARE_PROVIDER_SITE_OTHER): Payer: Medicare Other | Admitting: Vascular Surgery

## 2020-03-04 VITALS — BP 116/73 | HR 96 | Temp 97.8°F | Resp 18 | Ht 73.0 in | Wt 211.0 lb

## 2020-03-04 DIAGNOSIS — I739 Peripheral vascular disease, unspecified: Secondary | ICD-10-CM | POA: Diagnosis not present

## 2020-03-04 DIAGNOSIS — S81809A Unspecified open wound, unspecified lower leg, initial encounter: Secondary | ICD-10-CM | POA: Diagnosis not present

## 2020-03-04 NOTE — Anesthesia Preprocedure Evaluation (Addendum)
Anesthesia Evaluation  Patient identified by MRN, date of birth, ID band Patient confused    Reviewed: Allergy & Precautions, NPO status , Patient's Chart, lab work & pertinent test results  Airway Mallampati: III  TM Distance: >3 FB Neck ROM: Full    Dental  (+) Edentulous Upper, Edentulous Lower   Pulmonary former smoker,    breath sounds clear to auscultation       Cardiovascular hypertension,  Rhythm:Regular Rate:Normal     Neuro/Psych    GI/Hepatic   Endo/Other  diabetes  Renal/GU      Musculoskeletal   Abdominal   Peds  Hematology   Anesthesia Other Findings   Reproductive/Obstetrics                           Anesthesia Physical Anesthesia Plan  ASA: III  Anesthesia Plan: General   Post-op Pain Management:    Induction: Intravenous  PONV Risk Score and Plan: Ondansetron  Airway Management Planned: LMA  Additional Equipment:   Intra-op Plan:   Post-operative Plan: Extubation in OR  Informed Consent: I have reviewed the patients History and Physical, chart, labs and discussed the procedure including the risks, benefits and alternatives for the proposed anesthesia with the patient or authorized representative who has indicated his/her understanding and acceptance.       Plan Discussed with: CRNA and Anesthesiologist  Anesthesia Plan Comments: (PAT note written 03/04/2020 by Shonna Chock, PA-C.  As noted in progress notes, patient has LBBB previously unknown. Patient is bedridden and has partial aphasia. Given co-morbidities and infection in L foot, will plan to proceed with surgery and notify nursing home care givers of LBBB.  Kipp Brood)      Anesthesia Quick Evaluation

## 2020-03-04 NOTE — Progress Notes (Signed)
Anesthesia Chart Review: SAME DAY WORK-UP   Case: [id number] Date/Time: 03/05/20 1100   Procedure: LEFT ABOVE KNEE AMPUTATION (Left Knee)   Anesthesia type: General   Pre-op diagnosis: NON-HEALING LEFT LOWER EXTREMITY WOUND   [facility] / [facility]   Surgeons: Larina Earthly, MD      DISCUSSION: Patient is a 59 year old male scheduled for the above procedure. Patient evaluated by Dr. Arbie Cookey on 03/04/20 for nonhealing left foot wounds.   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 Non-ambulatory.   He is to continue ASA per [facility].  He is for same day COVID-19 testing as he is a resident at [facility]. Anesthesia team evaluation with EKG and labs as indicated on the day of surgery.   VS:  Wt Readings from Last 3 Encounters:  03/04/20 95.7 kg  10/04/19 96.2 kg  10/15/16 102.1 kg   BP Readings from Last 3 Encounters:  03/04/20 116/73  10/04/19 110/72  05/20/18 130/68   Pulse Readings from Last 3 Encounters:  03/04/20 96  10/04/19 80  05/20/18 84    PROVIDERS: Pcp, No. He resides at [facility].  - He is not followed routinely by cardiology, but was evaluated by Charlton Haws, MD on 08/24/07 for postural hypotension secondary to CVA and dysautonomia diabetes. Keep well hydrated. Given hemorrhagic CVA from HTN, he did not recommend starting Florinef of Midodrine at that time. Echo was ordered to evaluate EF (see below).    LABS: He is for labs on the day of surgery. As of 11/11/19, Cr 1.54 (previously ~ 0.90 in 2019), H/H 11.2/37.2, PLT 183, glucose 75.    IMAGES: MRI Left foot 11/27/19: IMPRESSION: 1. Deep open wound along the  lateral aspect of the foot at the level of the fifth metatarsal base near the previous amputation site. There is underlying osteomyelitis involving the fifth metatarsal base. 2. Osteomyelitis involving the proximal and middle phalanges of the second toe and PIP joint septic arthritis. 3. Mild diffuse cellulitis and myofasciitis without definite findings for pyomyositis.   EKG: Last EKG noted is from 10/16/16 and showed NSR.   CV: Remote echo from 08/25/07: SUMMARY  - Overall left ventricular systolic function was normal. Left     ventricular ejection fraction was estimated to be 55 %. There     was possible hypokinesis of the posterior wall. Left     ventricular wall thickness was mildly to moderately     increased.  - The aortic valve was mildly calcified.  - The right ventricle was mild to moderately dilated.  - Trivial posterior effusion     Past Medical History:  Diagnosis Date  . Anemia   . Chronic indwelling Foley catheter   . Depression   . Diabetes mellitus    Type II  . DVT (deep venous thrombosis) (HCC)   . GERD (gastroesophageal reflux disease)   . Hemiplegia (HCC)   . Hyperlipemia   . Hypertension   . Neurogenic bladder   . OCD (obsessive compulsive disorder)   . Pneumonia   . Seizures (HCC)    since a head injury in high school- no recent sezizures  . Stroke [facility])    deficit in right arm &  right leg    Past Surgical History:  Procedure Laterality Date  . EYE SURGERY     cataract removal left eye  . INSERTION OF SUPRAPUBIC CATHETER N/A 06/23/2017   Procedure: INSERTION OF SUPRAPUBIC CATHETER;  Surgeon: Malen Gauze, MD;  Location: [facility] ORS;  Service: Urology;  Laterality: N/A;  . MULTIPLE EXTRACTIONS WITH ALVEOLOPLASTY N/A 05/30/2013   Procedure: MULTIPLE EXTRACTION Teeth number Six, Eight, Nine, Ten, Eleven, Thirteen, Nineteen, and Twenty Eight  WITH ALVEOLOPLASTY;  Surgeon: Georgia Lopes, DDS;  Location: [facility] OR;  Service:  Oral Surgery;  Laterality: N/A;  . MULTIPLE TOOTH EXTRACTIONS    . SUPRAPUBIC CATHETER INSERTION  2009  . TOOTH EXTRACTION N/A 10/16/2016   Procedure: DENTAL RESTORATION/EXTRACTIONS NUMBER FIVE, FOURTEEN, TWENTY-TWO, TWENTY- THREE, TWENTY-FOUR, TWENTY-FIVE, TWENTY-SIX, TWENTY-SEVEN, THIRTY-ONE, AND ALVEOLOPLASTY.;  Surgeon: Ocie Doyne, DDS;  Location: [facility];  Service: Oral Surgery;  Laterality: N/A;  . TRACHEOSTOMY  2009   also removal of     MEDICATIONS: No current facility-administered medications for this encounter.   Marland Kitchen acetaminophen (TYLENOL) 500 MG tablet  . Amino Acids-Protein Hydrolys (FEEDING SUPPLEMENT, PRO-STAT SUGAR FREE 64,) LIQD  . aspirin 81 MG chewable tablet  . baclofen (LIORESAL) 10 MG tablet  . barrier cream (NON-SPECIFIED) CREA  . bisacodyl (DULCOLAX) 5 MG EC tablet  . brimonidine (ALPHAGAN) 0.2 % ophthalmic solution  . calcium-vitamin D (OSCAL WITH D) 500-200 MG-UNIT tablet  . CALMOSEPTINE 0.44-20.6 % OINT  . clonazePAM (KLONOPIN) 0.5 MG tablet  . divalproex (DEPAKOTE) 500 MG DR tablet  . doxycycline (VIBRAMYCIN) 100 MG capsule  . escitalopram (LEXAPRO) 20 MG tablet  . ezetimibe (ZETIA) 10 MG tablet  . famotidine (PEPCID) 20 MG tablet  . Fe-Succ-C-Thre-B12-Des Stomach (MULTIGEN PO)  . ferrous sulfate 325 (65 FE) MG tablet  . folic acid (FOLVITE) 1 MG tablet  . guaiFENesin-dextromethorphan (ROBITUSSIN DM) 100-10 MG/5ML syrup  . insulin glargine (LANTUS) 100 UNIT/ML injection  . insulin lispro (HUMALOG) 100 UNIT/ML injection  . lisinopril (PRINIVIL,ZESTRIL) 2.5 MG tablet  . Menthol, Topical Analgesic, (BIOFREEZE EX)  . metFORMIN (GLUCOPHAGE) 1000 MG tablet  . MILK OF MAGNESIA 400 MG/5ML suspension  . Multiple Vitamins-Minerals (CERTAGEN) tablet  . MYRBETRIQ 50 MG TB24 tablet  . omega-3 acid ethyl esters (LOVAZA) 1 G capsule  . ondansetron (ZOFRAN-ODT) 4 MG disintegrating tablet  . oxyCODONE 10 MG TABS  . polyvinyl alcohol (LIQUIFILM TEARS) 1.4 %  ophthalmic solution  . pregabalin (LYRICA) 100 MG capsule  . rosuvastatin (CRESTOR) 20 MG tablet  . senna-docusate (SENOKOT-S) 8.6-50 MG tablet  . solifenacin (VESICARE) 10 MG tablet  . TYLENOL 325 MG CAPS  . vitamin C (ASCORBIC ACID) 500 MG tablet  . Vitamin D, Ergocalciferol, (DRISDOL) 50000 units CAPS capsule    Shonna Chock, PA-C Surgical Short Stay/Anesthesiology [facility] Phone [phone] [facility] Phone [phone] 03/04/2020 4:22 PM

## 2020-03-04 NOTE — Telephone Encounter (Signed)
Contacted [facility] on multiple attempts  with no answer after continuous ringing. Informed patient's wife [name] of failed contact attempts and reviewed again patients arrival time of 0945 am on tomorrow to admitting at [facility], which is earlier to allow time for same day Covid testing and NPO after midnight. Wife verbalized understanding and stated she will attempt to reach nurse and inform of information.

## 2020-03-04 NOTE — Progress Notes (Signed)
[facility] [place]  Patient name: [name] MRN: [id number] DOB: 15-May-1960 Sex: male  REASON FOR CONSULT: Evaluation nonhealing left foot wounds  HPI: [name] is a 59 y.o. male, who is here today for evaluation of nonhealing left foot wounds.  He is here today with his wife.  He is a very  unfortunate gentleman who suffered a major stroke in 2009.  He has had multiple difficulties associated with this.  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  He is able to speak a slight amount.  His wife provided all of his history.  He has a suprapubic catheter.  He has had long history of ulcerations on his lower extremity.  He is completely nonambulatory and is lifted from bed to chair.  Past Medical History:  Diagnosis Date  . Anemia   . Chronic indwelling Foley catheter   . Depression   . Diabetes mellitus    Type II  . DVT (deep venous thrombosis) (HCC)   . GERD (gastroesophageal reflux disease)   . Hemiplegia (HCC)   . Hyperlipemia   . Hypertension   . Neurogenic bladder   . OCD (obsessive compulsive disorder)   . Pneumonia   . Seizures (HCC)    since a head injury in high school- no recent sezizures  . Stroke [facility])    deficit in right arm & right leg    History reviewed. No pertinent family history.  SOCIAL HISTORY: Social History   Socioeconomic History  . Marital status: Married    Spouse name: Not on file  . Number of children: Not on file  . Years of education: Not on file  . Highest education level: Not on file  Occupational History  . Not on file  Tobacco Use  . Smoking status: Former Games developer  . Smokeless tobacco: Never Used  Substance and Sexual Activity  . Alcohol use: No  . Drug use: No  . Sexual activity: Never  Other Topics Concern  . Not on file  Social History Narrative  . Not on file   Social Determinants of Health   Financial Resource Strain:   . Difficulty of Paying Living Expenses:  Not on file  Food Insecurity:   . Worried About Programme researcher, broadcasting/film/video in the Last Year: Not on file  . Ran Out of Food in the Last Year: Not on file  Transportation Needs:   . Lack of Transportation (Medical): Not on file  . Lack of Transportation (Non-Medical): Not on file  Physical Activity:   . Days of Exercise per Week: Not on file  . Minutes of Exercise per Session: Not on file  Stress:   . Feeling of Stress : Not on file  Social Connections:   . Frequency of Communication with Friends and Family: Not on file  . Frequency of Social Gatherings with Friends and Family: Not on file  . Attends Religious Services: Not on file  . Active Member of Clubs or Organizations: Not on file  . Attends Banker Meetings: Not on file  . Marital Status: Not on file  Intimate Partner Violence:   . Fear of Current or Ex-Partner: Not on file  . Emotionally Abused: Not on file  . Physically Abused: Not on file  . Sexually Abused: Not on file    Allergies  Allergen Reactions  . No Known Allergies     Current Outpatient Medications  Medication Sig Dispense Refill  . acetaminophen (  TYLENOL) 500 MG tablet Take 500 mg by mouth every 8 (eight) hours as needed for moderate pain.     . Amino Acids-Protein Hydrolys (FEEDING SUPPLEMENT, PRO-STAT SUGAR FREE 64,) LIQD Take 30 mLs by mouth every morning.    . baclofen (LIORESAL) 10 MG tablet Take 15 mg by mouth 3 (three) times daily.      . barrier cream (NON-SPECIFIED) CREA Apply 1 application topically as needed (APPLY AFTER EACH CHANGING).    Marland Kitchen bisacodyl (DULCOLAX) 5 MG EC tablet Take 10 mg by mouth daily as needed (FOR CONSTIPATION).     Marland Kitchen brimonidine (ALPHAGAN) 0.2 % ophthalmic solution Place 1 drop into both eyes 2 (two) times daily.    . calcium-vitamin D (OSCAL WITH D) 500-200 MG-UNIT tablet Take 1 tablet by mouth.    Marland Kitchen CALMOSEPTINE 0.44-20.6 % OINT Apply  liberally to affected area as directed  apply to buttocks/gluteal folds every shift     . clonazePAM (KLONOPIN) 0.5 MG tablet Take 0.5 mg by mouth 2 (two) times daily as needed.    . divalproex (DEPAKOTE) 500 MG DR tablet Take 500 mg by mouth 4 (four) times daily.     Marland Kitchen doxycycline (VIBRAMYCIN) 100 MG capsule Take 100 mg by mouth daily.    Marland Kitchen escitalopram (LEXAPRO) 20 MG tablet Take 20 mg by mouth every morning.    . ezetimibe (ZETIA) 10 MG tablet Take 10 mg by mouth every morning.    . famotidine (PEPCID) 20 MG tablet Take 20 mg by mouth every morning.     Marland Kitchen Fe-Succ-C-Thre-B12-Des Stomach (MULTIGEN PO) Take 1 tablet by mouth every morning.    . ferrous sulfate 325 (65 FE) MG tablet Take 325 mg by mouth daily with breakfast.    . folic acid (FOLVITE) 1 MG tablet Take 1 mg by mouth daily.    Marland Kitchen guaiFENesin-dextromethorphan (ROBITUSSIN DM) 100-10 MG/5ML syrup Take 10 mLs by mouth every 4 (four) hours as needed for cough.    . insulin glargine (LANTUS) 100 UNIT/ML injection Inject 95 Units into the skin every morning.     . insulin lispro (HUMALOG) 100 UNIT/ML injection Inject 12 Units into the skin 3 (three) times daily before meals.    Marland Kitchen lisinopril (PRINIVIL,ZESTRIL) 2.5 MG tablet Take 2.5 mg by mouth every morning.    . Menthol, Topical Analgesic, (BIOFREEZE EX) Apply 1 application topically 2 (two) times daily as needed (FOR HIP/LEG PAIN). 5% TOPICAL SOLUTION    . metFORMIN (GLUCOPHAGE) 1000 MG tablet Take 1,000-1,500 mg by mouth 2 (two) times daily. 1000 MG IN THE EVENING & 1500 MG IN THE MORNING (1000 &1700)    . MILK OF MAGNESIA 400 MG/5ML suspension SMARTSIG:30 Milliliter(s) By Mouth Every Night PRN    . Multiple Vitamins-Minerals (CERTAGEN) tablet Take 1 tablet by mouth daily.    Marland Kitchen MYRBETRIQ 50 MG TB24 tablet Take 50 mg by mouth daily.    Marland Kitchen omega-3 acid ethyl esters (LOVAZA) 1 G capsule Take 2 g by mouth 2 (two) times daily. (1000&2200)    . ondansetron (ZOFRAN-ODT) 4 MG disintegrating tablet Take 4 mg by mouth every 4 (four) hours as needed for nausea or vomiting.    Marland Kitchen  oxyCODONE 10 MG TABS Take 1 tablet (10 mg total) by mouth every 4 (four) hours as needed for severe pain or breakthrough pain. 30 tablet 0  . polyvinyl alcohol (LIQUIFILM TEARS) 1.4 % ophthalmic solution Place 1 drop into both eyes 2 (two) times daily.    . pregabalin (  LYRICA) 100 MG capsule Take 100 mg by mouth 3 (three) times daily.     . rosuvastatin (CRESTOR) 20 MG tablet Take 20 mg by mouth daily.    Marland Kitchen senna-docusate (SENOKOT-S) 8.6-50 MG tablet Take 2 tablets by mouth daily at 10 pm.    . solifenacin (VESICARE) 10 MG tablet Take 10 mg by mouth every morning.    . TYLENOL 325 MG CAPS Take 650 mg by mouth daily before breakfast.    . vitamin C (ASCORBIC ACID) 500 MG tablet Take 500 mg by mouth 2 (two) times daily.    . Vitamin D, Ergocalciferol, (DRISDOL) 50000 units CAPS capsule Take 50,000 Units by mouth every 30 (thirty) days.    Marland Kitchen aspirin 81 MG chewable tablet Chew 81 mg by mouth every morning.  (Patient not taking: Reported on 03/04/2020)     No current facility-administered medications for this visit.    REVIEW OF SYSTEMS:  Nothing to add other than past history in history of present illness PHYSICAL EXAM: Vitals:   03/04/20 0949  BP: 116/73  Pulse: 96  Resp: 18  Temp: 97.8 F (36.6 C)  TempSrc: Other (Comment)  SpO2: 97%  Weight: 211 lb (95.7 kg)  Height: 6\' 1"  (1.854 m)    GENERAL: The patient is a well-nourished male, in no acute distress. The vital signs are documented above. CARDIOVASCULAR: He does have palpable popliteal pulses bilaterally and absent distal pulses bilaterally PULMONARY: There is good air exchange  ABDOMEN: Soft and non-tender  MUSCULOSKELETAL: There are no major deformities or cyanosis. NEUROLOGIC: Can answer some yes and no questions.  Minimal movement on both right and left side. SKIN: No open ulcers on his right foot.  Multiple ulcerations over his great and fifth toe on the left. PSYCHIATRIC: The patient has a normal affect.  DATA:  MRI of  his left foot suggested osteo in his wounds  Noninvasive studies reveal calcified vessels therefore ABIs not obtainable.  He has no flow in his dorsalis pedis bilaterally and monophasic posterior tibial bilaterally  MEDICAL ISSUES: I discussed the significance of these findings with the patient and his wife present.  He does have palpable popliteal pulses but nothing distally.  He has a severe distal disease.  Feels only option would be a left leg amputation.  He and his wife had discussed this prior to the visit suspecting that this would be the case.  He is completely nonambulatory.  He does not stand to transfer in his lift from bed to chair.  I explained that he in all likelihood does have arterial flow adequate to heal a below-knee amputation.  I have recommended above-knee amputation however.  I explained that he would have no improved rehab benefit from below versus above-knee amputation would have a much more likely chance of healing with no future events with an above-knee versus below-knee amputation.  We will proceed with surgery tomorrow for left above-knee amputation at [facility]   [facility], MD [facility] [place] [facility] Tel [phone] Pager [phone]

## 2020-03-04 NOTE — Progress Notes (Signed)
Spoke to [employer], Charity fundraiser at [employer].  Reviewed medications to take, NPO status, arrival time and general info.  Faxed instructions to him.  Patient will have EKG DOS.  Had CXR 05/29/13.  Denies echo, stress test, or cardiac catherization.  Sent to anesthesia for review.

## 2020-03-04 NOTE — H&P (View-Only) (Signed)
[facility] [place]  Patient name: [name] MRN: [id number] DOB: 15-May-1960 Sex: male  REASON FOR CONSULT: Evaluation nonhealing left foot wounds  HPI: [name] is a 59 y.o. male, who is here today for evaluation of nonhealing left foot wounds.  He is here today with his wife.  He is a very  unfortunate gentleman who suffered a major stroke in 2009.  He has had multiple difficulties associated with this.  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  He is able to speak a slight amount.  His wife provided all of his history.  He has a suprapubic catheter.  He has had long history of ulcerations on his lower extremity.  He is completely nonambulatory and is lifted from bed to chair.  Past Medical History:  Diagnosis Date  . Anemia   . Chronic indwelling Foley catheter   . Depression   . Diabetes mellitus    Type II  . DVT (deep venous thrombosis) (HCC)   . GERD (gastroesophageal reflux disease)   . Hemiplegia (HCC)   . Hyperlipemia   . Hypertension   . Neurogenic bladder   . OCD (obsessive compulsive disorder)   . Pneumonia   . Seizures (HCC)    since a head injury in high school- no recent sezizures  . Stroke [facility])    deficit in right arm & right leg    History reviewed. No pertinent family history.  SOCIAL HISTORY: Social History   Socioeconomic History  . Marital status: Married    Spouse name: Not on file  . Number of children: Not on file  . Years of education: Not on file  . Highest education level: Not on file  Occupational History  . Not on file  Tobacco Use  . Smoking status: Former Games developer  . Smokeless tobacco: Never Used  Substance and Sexual Activity  . Alcohol use: No  . Drug use: No  . Sexual activity: Never  Other Topics Concern  . Not on file  Social History Narrative  . Not on file   Social Determinants of Health   Financial Resource Strain:   . Difficulty of Paying Living Expenses:  Not on file  Food Insecurity:   . Worried About Programme researcher, broadcasting/film/video in the Last Year: Not on file  . Ran Out of Food in the Last Year: Not on file  Transportation Needs:   . Lack of Transportation (Medical): Not on file  . Lack of Transportation (Non-Medical): Not on file  Physical Activity:   . Days of Exercise per Week: Not on file  . Minutes of Exercise per Session: Not on file  Stress:   . Feeling of Stress : Not on file  Social Connections:   . Frequency of Communication with Friends and Family: Not on file  . Frequency of Social Gatherings with Friends and Family: Not on file  . Attends Religious Services: Not on file  . Active Member of Clubs or Organizations: Not on file  . Attends Banker Meetings: Not on file  . Marital Status: Not on file  Intimate Partner Violence:   . Fear of Current or Ex-Partner: Not on file  . Emotionally Abused: Not on file  . Physically Abused: Not on file  . Sexually Abused: Not on file    Allergies  Allergen Reactions  . No Known Allergies     Current Outpatient Medications  Medication Sig Dispense Refill  . acetaminophen (  TYLENOL) 500 MG tablet Take 500 mg by mouth every 8 (eight) hours as needed for moderate pain.     . Amino Acids-Protein Hydrolys (FEEDING SUPPLEMENT, PRO-STAT SUGAR FREE 64,) LIQD Take 30 mLs by mouth every morning.    . baclofen (LIORESAL) 10 MG tablet Take 15 mg by mouth 3 (three) times daily.      . barrier cream (NON-SPECIFIED) CREA Apply 1 application topically as needed (APPLY AFTER EACH CHANGING).    Marland Kitchen bisacodyl (DULCOLAX) 5 MG EC tablet Take 10 mg by mouth daily as needed (FOR CONSTIPATION).     Marland Kitchen brimonidine (ALPHAGAN) 0.2 % ophthalmic solution Place 1 drop into both eyes 2 (two) times daily.    . calcium-vitamin D (OSCAL WITH D) 500-200 MG-UNIT tablet Take 1 tablet by mouth.    Marland Kitchen CALMOSEPTINE 0.44-20.6 % OINT Apply  liberally to affected area as directed  apply to buttocks/gluteal folds every shift     . clonazePAM (KLONOPIN) 0.5 MG tablet Take 0.5 mg by mouth 2 (two) times daily as needed.    . divalproex (DEPAKOTE) 500 MG DR tablet Take 500 mg by mouth 4 (four) times daily.     Marland Kitchen doxycycline (VIBRAMYCIN) 100 MG capsule Take 100 mg by mouth daily.    Marland Kitchen escitalopram (LEXAPRO) 20 MG tablet Take 20 mg by mouth every morning.    . ezetimibe (ZETIA) 10 MG tablet Take 10 mg by mouth every morning.    . famotidine (PEPCID) 20 MG tablet Take 20 mg by mouth every morning.     Marland Kitchen Fe-Succ-C-Thre-B12-Des Stomach (MULTIGEN PO) Take 1 tablet by mouth every morning.    . ferrous sulfate 325 (65 FE) MG tablet Take 325 mg by mouth daily with breakfast.    . folic acid (FOLVITE) 1 MG tablet Take 1 mg by mouth daily.    Marland Kitchen guaiFENesin-dextromethorphan (ROBITUSSIN DM) 100-10 MG/5ML syrup Take 10 mLs by mouth every 4 (four) hours as needed for cough.    . insulin glargine (LANTUS) 100 UNIT/ML injection Inject 95 Units into the skin every morning.     . insulin lispro (HUMALOG) 100 UNIT/ML injection Inject 12 Units into the skin 3 (three) times daily before meals.    Marland Kitchen lisinopril (PRINIVIL,ZESTRIL) 2.5 MG tablet Take 2.5 mg by mouth every morning.    . Menthol, Topical Analgesic, (BIOFREEZE EX) Apply 1 application topically 2 (two) times daily as needed (FOR HIP/LEG PAIN). 5% TOPICAL SOLUTION    . metFORMIN (GLUCOPHAGE) 1000 MG tablet Take 1,000-1,500 mg by mouth 2 (two) times daily. 1000 MG IN THE EVENING & 1500 MG IN THE MORNING (1000 &1700)    . MILK OF MAGNESIA 400 MG/5ML suspension SMARTSIG:30 Milliliter(s) By Mouth Every Night PRN    . Multiple Vitamins-Minerals (CERTAGEN) tablet Take 1 tablet by mouth daily.    Marland Kitchen MYRBETRIQ 50 MG TB24 tablet Take 50 mg by mouth daily.    Marland Kitchen omega-3 acid ethyl esters (LOVAZA) 1 G capsule Take 2 g by mouth 2 (two) times daily. (1000&2200)    . ondansetron (ZOFRAN-ODT) 4 MG disintegrating tablet Take 4 mg by mouth every 4 (four) hours as needed for nausea or vomiting.    Marland Kitchen  oxyCODONE 10 MG TABS Take 1 tablet (10 mg total) by mouth every 4 (four) hours as needed for severe pain or breakthrough pain. 30 tablet 0  . polyvinyl alcohol (LIQUIFILM TEARS) 1.4 % ophthalmic solution Place 1 drop into both eyes 2 (two) times daily.    . pregabalin (  LYRICA) 100 MG capsule Take 100 mg by mouth 3 (three) times daily.     . rosuvastatin (CRESTOR) 20 MG tablet Take 20 mg by mouth daily.    Marland Kitchen senna-docusate (SENOKOT-S) 8.6-50 MG tablet Take 2 tablets by mouth daily at 10 pm.    . solifenacin (VESICARE) 10 MG tablet Take 10 mg by mouth every morning.    . TYLENOL 325 MG CAPS Take 650 mg by mouth daily before breakfast.    . vitamin C (ASCORBIC ACID) 500 MG tablet Take 500 mg by mouth 2 (two) times daily.    . Vitamin D, Ergocalciferol, (DRISDOL) 50000 units CAPS capsule Take 50,000 Units by mouth every 30 (thirty) days.    Marland Kitchen aspirin 81 MG chewable tablet Chew 81 mg by mouth every morning.  (Patient not taking: Reported on 03/04/2020)     No current facility-administered medications for this visit.    REVIEW OF SYSTEMS:  Nothing to add other than past history in history of present illness PHYSICAL EXAM: Vitals:   03/04/20 0949  BP: 116/73  Pulse: 96  Resp: 18  Temp: 97.8 F (36.6 C)  TempSrc: Other (Comment)  SpO2: 97%  Weight: 211 lb (95.7 kg)  Height: 6\' 1"  (1.854 m)    GENERAL: The patient is a well-nourished male, in no acute distress. The vital signs are documented above. CARDIOVASCULAR: He does have palpable popliteal pulses bilaterally and absent distal pulses bilaterally PULMONARY: There is good air exchange  ABDOMEN: Soft and non-tender  MUSCULOSKELETAL: There are no major deformities or cyanosis. NEUROLOGIC: Can answer some yes and no questions.  Minimal movement on both right and left side. SKIN: No open ulcers on his right foot.  Multiple ulcerations over his great and fifth toe on the left. PSYCHIATRIC: The patient has a normal affect.  DATA:  MRI of  his left foot suggested osteo in his wounds  Noninvasive studies reveal calcified vessels therefore ABIs not obtainable.  He has no flow in his dorsalis pedis bilaterally and monophasic posterior tibial bilaterally  MEDICAL ISSUES: I discussed the significance of these findings with the patient and his wife present.  He does have palpable popliteal pulses but nothing distally.  He has a severe distal disease.  Feels only option would be a left leg amputation.  He and his wife had discussed this prior to the visit suspecting that this would be the case.  He is completely nonambulatory.  He does not stand to transfer in his lift from bed to chair.  I explained that he in all likelihood does have arterial flow adequate to heal a below-knee amputation.  I have recommended above-knee amputation however.  I explained that he would have no improved rehab benefit from below versus above-knee amputation would have a much more likely chance of healing with no future events with an above-knee versus below-knee amputation.  We will proceed with surgery tomorrow for left above-knee amputation at [facility]   [facility], MD [facility] Tel [phone] Pager [phone]

## 2020-03-04 NOTE — Progress Notes (Signed)
[facility] - [place], [place] - [address] [place] [address] Phone: [phone] Fax: [phone]      Your procedure is scheduled on 03/05/20.  Report to [facility] Main Entrance "A" at 8:15 A.M., and check in at the Admitting office.  Call this number if you have problems the morning of surgery:  [phone]  Call [phone] if you have any questions prior to your surgery date Monday-Friday 8am-4pm    Remember:  Do not eat or drink after midnight the night before your surgery    Take these medicines the morning of surgery with A SIP OF WATER: Baclofen Eye Drops Depakote Dosycycline Lexapro Zetia Pepcid Myrbetriq Lyrica Crestor Vesicare Tylenol  If needed: Zofran Oxycodone  As of today, STOP taking any Aspirin (unless otherwise instructed by your surgeon) Aleve, Naproxen, Ibuprofen, Motrin, Advil, Goody's, BC's, all herbal medications, fish oil, and all vitamins.                      Do not wear jewelry            Do not wear lotions, powders, colognes, or deodorant.            Do not shave 48 hours prior to surgery.  Men may shave face and neck.            Do not bring valuables to the hospital.            [facility] is not responsible for any belongings or valuables.  Do NOT Smoke (Tobacco/Vaping) or drink Alcohol 24 hours prior to your procedure If you use a CPAP at night, you may bring all equipment for your overnight stay.   Contacts, glasses, dentures or bridgework may not be worn into surgery.      For patients admitted to the hospital, discharge time will be determined by your treatment team.   Patients discharged the day of surgery will not be allowed to drive home, and someone needs to stay with them for 24 hours.    Special instructions:   [place]- Preparing For Surgery  Before surgery, you can play an important role. Because skin is not sterile, your skin needs to be as free of germs as  possible. You can reduce the number of germs on your skin by washing with CHG (chlorahexidine gluconate) Soap before surgery.  CHG is an antiseptic cleaner which kills germs and bonds with the skin to continue killing germs even after washing.    Oral Hygiene is also important to reduce your risk of infection.  Remember - BRUSH YOUR TEETH THE MORNING OF SURGERY WITH YOUR REGULAR TOOTHPASTE  Please do not use if you have an allergy to CHG or antibacterial soaps. If your skin becomes reddened/irritated stop using the CHG.  Do not shave (including legs and underarms) for at least 48 hours prior to first CHG shower. It is OK to shave your face.  Please follow these instructions carefully.   1. Shower the NIGHT BEFORE SURGERY and the MORNING OF SURGERY with CHG Soap.   2. If you chose to wash your hair, wash your hair first as usual with your normal shampoo.  3. After you shampoo, rinse your hair and body thoroughly to remove the shampoo.  4. Use CHG as you would any other liquid soap. You can apply CHG directly to the skin and wash gently with a scrungie or a clean washcloth.   5.  Apply the CHG Soap to your body ONLY FROM THE NECK DOWN.  Do not use on open wounds or open sores. Avoid contact with your eyes, ears, mouth and genitals (private parts). Wash Face and genitals (private parts)  with your normal soap.   6. Wash thoroughly, paying special attention to the area where your surgery will be performed.  7. Thoroughly rinse your body with warm water from the neck down.  8. DO NOT shower/wash with your normal soap after using and rinsing off the CHG Soap.  9. Pat yourself dry with a CLEAN TOWEL.  10. Wear CLEAN PAJAMAS to bed the night before surgery  11. Place CLEAN SHEETS on your bed the night of your first shower and DO NOT SLEEP WITH PETS.   Day of Surgery: Wear Clean/Comfortable clothing the morning of surgery Do not apply any deodorants/lotions.   Remember to brush your teeth  WITH YOUR REGULAR TOOTHPASTE.   Please read over the following fact sheets that you were given.

## 2020-03-05 ENCOUNTER — Inpatient Hospital Stay (HOSPITAL_COMMUNITY): Payer: Medicare Other | Admitting: Vascular Surgery

## 2020-03-05 ENCOUNTER — Inpatient Hospital Stay (HOSPITAL_COMMUNITY)
Admission: RE | Admit: 2020-03-05 | Discharge: 2020-03-07 | DRG: 239 | Disposition: A | Discharge status: Skilled Nursing Facility | Payer: Medicare Other | Attending: Family Medicine | Admitting: Family Medicine

## 2020-03-05 ENCOUNTER — Encounter (HOSPITAL_COMMUNITY)
Admission: RE | Disposition: A | Discharge status: Skilled Nursing Facility | Payer: Self-pay | Source: Home / Self Care | Attending: Family Medicine

## 2020-03-05 ENCOUNTER — Encounter (HOSPITAL_COMMUNITY): Payer: Self-pay | Admitting: Vascular Surgery

## 2020-03-05 DIAGNOSIS — F32A Depression, unspecified: Secondary | ICD-10-CM | POA: Diagnosis present

## 2020-03-05 DIAGNOSIS — E1122 Type 2 diabetes mellitus with diabetic chronic kidney disease: Secondary | ICD-10-CM | POA: Diagnosis present

## 2020-03-05 DIAGNOSIS — S78119A Complete traumatic amputation at level between unspecified hip and knee, initial encounter: Secondary | ICD-10-CM

## 2020-03-05 DIAGNOSIS — L97529 Non-pressure chronic ulcer of other part of left foot with unspecified severity: Secondary | ICD-10-CM | POA: Diagnosis present

## 2020-03-05 DIAGNOSIS — Z20822 Contact with and (suspected) exposure to covid-19: Secondary | ICD-10-CM | POA: Diagnosis present

## 2020-03-05 DIAGNOSIS — I69320 Aphasia following cerebral infarction: Secondary | ICD-10-CM | POA: Diagnosis not present

## 2020-03-05 DIAGNOSIS — Z79899 Other long term (current) drug therapy: Secondary | ICD-10-CM | POA: Diagnosis not present

## 2020-03-05 DIAGNOSIS — Z66 Do not resuscitate: Secondary | ICD-10-CM | POA: Diagnosis not present

## 2020-03-05 DIAGNOSIS — I4510 Unspecified right bundle-branch block: Secondary | ICD-10-CM | POA: Diagnosis present

## 2020-03-05 DIAGNOSIS — E1165 Type 2 diabetes mellitus with hyperglycemia: Secondary | ICD-10-CM | POA: Diagnosis present

## 2020-03-05 DIAGNOSIS — I129 Hypertensive chronic kidney disease with stage 1 through stage 4 chronic kidney disease, or unspecified chronic kidney disease: Secondary | ICD-10-CM | POA: Diagnosis present

## 2020-03-05 DIAGNOSIS — Z87891 Personal history of nicotine dependence: Secondary | ICD-10-CM

## 2020-03-05 DIAGNOSIS — F429 Obsessive-compulsive disorder, unspecified: Secondary | ICD-10-CM | POA: Diagnosis present

## 2020-03-05 DIAGNOSIS — Z7189 Other specified counseling: Secondary | ICD-10-CM | POA: Diagnosis not present

## 2020-03-05 DIAGNOSIS — I10 Essential (primary) hypertension: Secondary | ICD-10-CM | POA: Diagnosis present

## 2020-03-05 DIAGNOSIS — I69319 Unspecified symptoms and signs involving cognitive functions following cerebral infarction: Secondary | ICD-10-CM

## 2020-03-05 DIAGNOSIS — R569 Unspecified convulsions: Secondary | ICD-10-CM | POA: Diagnosis present

## 2020-03-05 DIAGNOSIS — E785 Hyperlipidemia, unspecified: Secondary | ICD-10-CM | POA: Diagnosis present

## 2020-03-05 DIAGNOSIS — I69351 Hemiplegia and hemiparesis following cerebral infarction affecting right dominant side: Secondary | ICD-10-CM | POA: Diagnosis not present

## 2020-03-05 DIAGNOSIS — I6930 Unspecified sequelae of cerebral infarction: Secondary | ICD-10-CM

## 2020-03-05 DIAGNOSIS — K219 Gastro-esophageal reflux disease without esophagitis: Secondary | ICD-10-CM | POA: Diagnosis present

## 2020-03-05 DIAGNOSIS — N319 Neuromuscular dysfunction of bladder, unspecified: Secondary | ICD-10-CM | POA: Diagnosis present

## 2020-03-05 DIAGNOSIS — E1152 Type 2 diabetes mellitus with diabetic peripheral angiopathy with gangrene: Secondary | ICD-10-CM | POA: Diagnosis present

## 2020-03-05 DIAGNOSIS — Z515 Encounter for palliative care: Secondary | ICD-10-CM

## 2020-03-05 DIAGNOSIS — R532 Functional quadriplegia: Secondary | ICD-10-CM | POA: Diagnosis present

## 2020-03-05 DIAGNOSIS — Z794 Long term (current) use of insulin: Secondary | ICD-10-CM

## 2020-03-05 DIAGNOSIS — N1831 Chronic kidney disease, stage 3a: Secondary | ICD-10-CM | POA: Diagnosis present

## 2020-03-05 DIAGNOSIS — Z86718 Personal history of other venous thrombosis and embolism: Secondary | ICD-10-CM | POA: Diagnosis not present

## 2020-03-05 HISTORY — PX: AMPUTATION: SHX166

## 2020-03-05 LAB — COMPREHENSIVE METABOLIC PANEL
ALT: 86 U/L — ABNORMAL HIGH (ref 0–44)
AST: 44 U/L — ABNORMAL HIGH (ref 15–41)
Albumin: 2.3 g/dL — ABNORMAL LOW (ref 3.5–5.0)
Alkaline Phosphatase: 138 U/L — ABNORMAL HIGH (ref 38–126)
Anion gap: 13 (ref 5–15)
BUN: 38 mg/dL — ABNORMAL HIGH (ref 6–20)
CO2: 22 mmol/L (ref 22–32)
Calcium: 9.2 mg/dL (ref 8.9–10.3)
Chloride: 97 mmol/L — ABNORMAL LOW (ref 98–111)
Creatinine, Ser: 1.29 mg/dL — ABNORMAL HIGH (ref 0.61–1.24)
GFR, Estimated: >60 mL/min (ref >60)
Glucose, Bld: 218 mg/dL — ABNORMAL HIGH (ref 70–99)
Potassium: 4.3 mmol/L (ref 3.5–5.1)
Sodium: 132 mmol/L — ABNORMAL LOW (ref 135–145)
Total Bilirubin: 0.6 mg/dL (ref 0.3–1.2)
Total Protein: 7.1 g/dL (ref 6.5–8.1)

## 2020-03-05 LAB — URINALYSIS, ROUTINE W REFLEX MICROSCOPIC
Bilirubin Urine: NEGATIVE
Glucose, UA: 50 mg/dL — AB
Ketones, ur: NEGATIVE mg/dL
Nitrite: NEGATIVE
Protein, ur: 30 mg/dL — AB
Specific Gravity, Urine: 1.012 (ref 1.005–1.030)
pH: 7 (ref 5.0–8.0)

## 2020-03-05 LAB — GLUCOSE, CAPILLARY
Glucose-Capillary: 221 mg/dL — ABNORMAL HIGH (ref 70–99)
Glucose-Capillary: 209 mg/dL — ABNORMAL HIGH (ref 70–99)
Glucose-Capillary: 200 mg/dL — ABNORMAL HIGH (ref 70–99)
Glucose-Capillary: 229 mg/dL — ABNORMAL HIGH (ref 70–99)

## 2020-03-05 LAB — HIV ANTIBODY (ROUTINE TESTING W REFLEX): HIV Screen 4th Generation wRfx: NONREACTIVE

## 2020-03-05 LAB — HEMOGLOBIN A1C
Hgb A1c MFr Bld: 8 % — ABNORMAL HIGH (ref 4.8–5.6)
Mean Plasma Glucose: 182.9 mg/dL

## 2020-03-05 LAB — CBC
HCT: 32.4 % — ABNORMAL LOW (ref 39.0–52.0)
Hemoglobin: 10.1 g/dL — ABNORMAL LOW (ref 13.0–17.0)
MCH: 26.5 pg (ref 26.0–34.0)
MCHC: 31.2 g/dL (ref 30.0–36.0)
MCV: 85 fL (ref 80.0–100.0)
Platelets: 298 10*3/uL (ref 150–400)
RBC: 3.81 MIL/uL — ABNORMAL LOW (ref 4.22–5.81)
RDW: 14.6 % (ref 11.5–15.5)
WBC: 10.8 10*3/uL — ABNORMAL HIGH (ref 4.0–10.5)
nRBC: 0 % (ref 0.0–0.2)

## 2020-03-05 LAB — SARS CORONAVIRUS 2 BY RT PCR (HOSPITAL ORDER, PERFORMED IN ~~LOC~~ HOSPITAL LAB): SARS Coronavirus 2: NEGATIVE

## 2020-03-05 LAB — APTT: aPTT: 35 seconds (ref 24–36)

## 2020-03-05 LAB — PROTIME-INR
INR: 1.1 (ref 0.8–1.2)
Prothrombin Time: 13.7 seconds (ref 11.4–15.2)

## 2020-03-05 SURGERY — AMPUTATION, ABOVE KNEE
Anesthesia: General | Site: Knee | Laterality: Left

## 2020-03-05 MED ORDER — INSULIN GLARGINE 100 UNIT/ML ~~LOC~~ SOLN: 95.0000 [IU] | Freq: Every morning | SUBCUTANEOUS | Status: DC

## 2020-03-05 MED ORDER — DARIFENACIN HYDROBROMIDE ER 15 MG PO TB24
15.0000 mg | ORAL_TABLET | Freq: Every day | ORAL | Status: DC
  Administered 2020-03-06 – 2020-03-07 (×2): 15 mg via ORAL
  Filled 2020-03-05 (×2): qty 1

## 2020-03-05 MED ORDER — FAMOTIDINE 20 MG PO TABS
20.0000 mg | ORAL_TABLET | Freq: Every morning | ORAL | Status: DC
  Administered 2020-03-06 – 2020-03-07 (×2): 20 mg via ORAL
  Filled 2020-03-05 (×2): qty 1

## 2020-03-05 MED ORDER — OXYCODONE HCL 10 MG PO TABS: 10.0000 mg | ORAL_TABLET | ORAL | Status: DC | PRN

## 2020-03-05 MED ORDER — SUGAMMADEX SODIUM 500 MG/5ML IV SOLN
INTRAVENOUS | Status: AC
  Filled 2020-03-05: qty 5

## 2020-03-05 MED ORDER — CHLORHEXIDINE GLUCONATE 0.12 % MT SOLN
15.0000 mL | Freq: Once | OROMUCOSAL | Status: AC
  Administered 2020-03-05: 15 mL via OROMUCOSAL
  Filled 2020-03-05: qty 15

## 2020-03-05 MED ORDER — ONDANSETRON HCL 4 MG/2ML IJ SOLN
INTRAMUSCULAR | Status: AC
  Filled 2020-03-05: qty 2

## 2020-03-05 MED ORDER — HEPARIN SODIUM (PORCINE) 5000 UNIT/ML IJ SOLN
5000.0000 [IU] | Freq: Three times a day (TID) | INTRAMUSCULAR | Status: DC
  Administered 2020-03-06 – 2020-03-07 (×4): 5000 [IU] via SUBCUTANEOUS
  Filled 2020-03-05 (×4): qty 1

## 2020-03-05 MED ORDER — INSULIN GLARGINE 100 UNIT/ML ~~LOC~~ SOLN
75.0000 [IU] | Freq: Every day | SUBCUTANEOUS | Status: DC
  Administered 2020-03-05 – 2020-03-06 (×2): 75 [IU] via SUBCUTANEOUS
  Filled 2020-03-05 (×3): qty 0.75

## 2020-03-05 MED ORDER — CEFAZOLIN SODIUM-DEXTROSE 2-4 GM/100ML-% IV SOLN
2.0000 g | Freq: Three times a day (TID) | INTRAVENOUS | Status: AC
  Administered 2020-03-05 – 2020-03-06 (×2): 2 g via INTRAVENOUS
  Filled 2020-03-05 (×2): qty 100

## 2020-03-05 MED ORDER — MORPHINE SULFATE (PF) 2 MG/ML IV SOLN
2.0000 mg | INTRAVENOUS | Status: DC | PRN
  Administered 2020-03-05 – 2020-03-06 (×3): 2 mg via INTRAVENOUS
  Filled 2020-03-05 (×4): qty 1

## 2020-03-05 MED ORDER — INSULIN ASPART 100 UNIT/ML ~~LOC~~ SOLN
0.0000 [IU] | Freq: Every day | SUBCUTANEOUS | Status: DC
  Administered 2020-03-05 – 2020-03-06 (×2): 2 [IU] via SUBCUTANEOUS

## 2020-03-05 MED ORDER — MIDAZOLAM HCL 2 MG/2ML IJ SOLN
INTRAMUSCULAR | Status: DC | PRN
  Administered 2020-03-05: 2 mg via INTRAVENOUS

## 2020-03-05 MED ORDER — DOCUSATE SODIUM 100 MG PO CAPS
100.0000 mg | ORAL_CAPSULE | Freq: Every day | ORAL | Status: DC
  Administered 2020-03-06 – 2020-03-07 (×2): 100 mg via ORAL
  Filled 2020-03-05 (×2): qty 1

## 2020-03-05 MED ORDER — FENTANYL CITRATE (PF) 100 MCG/2ML IJ SOLN
INTRAMUSCULAR | Status: AC
  Administered 2020-03-05: 50 ug via INTRAVENOUS
  Filled 2020-03-05: qty 2

## 2020-03-05 MED ORDER — OXYCODONE HCL 5 MG/5ML PO SOLN
ORAL | Status: AC
  Filled 2020-03-05: qty 5

## 2020-03-05 MED ORDER — LACTATED RINGERS IV SOLN: INTRAVENOUS | Status: DC

## 2020-03-05 MED ORDER — ACETAMINOPHEN 650 MG RE SUPP: 650.0000 mg | Freq: Four times a day (QID) | RECTAL | Status: DC | PRN

## 2020-03-05 MED ORDER — SODIUM CHLORIDE 0.9 % IV SOLN: INTRAVENOUS | Status: DC

## 2020-03-05 MED ORDER — SODIUM CHLORIDE (PF) 0.9 % IJ SOLN
INTRAMUSCULAR | Status: AC
  Filled 2020-03-05: qty 20

## 2020-03-05 MED ORDER — ESCITALOPRAM OXALATE 20 MG PO TABS
20.0000 mg | ORAL_TABLET | Freq: Every morning | ORAL | Status: DC
  Administered 2020-03-06 – 2020-03-07 (×2): 20 mg via ORAL
  Filled 2020-03-05 (×2): qty 1

## 2020-03-05 MED ORDER — FENTANYL CITRATE (PF) 250 MCG/5ML IJ SOLN
INTRAMUSCULAR | Status: AC
  Filled 2020-03-05: qty 5

## 2020-03-05 MED ORDER — ONDANSETRON HCL 4 MG/2ML IJ SOLN
INTRAMUSCULAR | Status: AC
  Filled 2020-03-05: qty 4

## 2020-03-05 MED ORDER — SUCCINYLCHOLINE CHLORIDE 200 MG/10ML IV SOSY
PREFILLED_SYRINGE | INTRAVENOUS | Status: AC
  Filled 2020-03-05: qty 20

## 2020-03-05 MED ORDER — PREGABALIN 100 MG PO CAPS
100.0000 mg | ORAL_CAPSULE | Freq: Three times a day (TID) | ORAL | Status: DC
  Administered 2020-03-05 – 2020-03-07 (×6): 100 mg via ORAL
  Filled 2020-03-05 (×6): qty 1

## 2020-03-05 MED ORDER — LIDOCAINE 2% (20 MG/ML) 5 ML SYRINGE
INTRAMUSCULAR | Status: AC
  Filled 2020-03-05: qty 15

## 2020-03-05 MED ORDER — MIRABEGRON ER 50 MG PO TB24
50.0000 mg | ORAL_TABLET | Freq: Every day | ORAL | Status: DC
  Administered 2020-03-06 – 2020-03-07 (×2): 50 mg via ORAL
  Filled 2020-03-05 (×2): qty 1

## 2020-03-05 MED ORDER — OXYCODONE HCL 5 MG PO TABS: 5.0000 mg | ORAL_TABLET | Freq: Once | ORAL | Status: DC | PRN

## 2020-03-05 MED ORDER — CEFAZOLIN SODIUM-DEXTROSE 2-4 GM/100ML-% IV SOLN
2.0000 g | INTRAVENOUS | Status: AC
  Administered 2020-03-05: 2 g via INTRAVENOUS
  Filled 2020-03-05: qty 100

## 2020-03-05 MED ORDER — OXYCODONE HCL 5 MG PO TABS: 10.0000 mg | ORAL_TABLET | ORAL | Status: DC | PRN

## 2020-03-05 MED ORDER — PHENYLEPHRINE 40 MCG/ML (10ML) SYRINGE FOR IV PUSH (FOR BLOOD PRESSURE SUPPORT)
PREFILLED_SYRINGE | INTRAVENOUS | Status: DC | PRN
  Administered 2020-03-05 (×2): 40 ug via INTRAVENOUS
  Administered 2020-03-05 (×2): 80 ug via INTRAVENOUS

## 2020-03-05 MED ORDER — LIDOCAINE 2% (20 MG/ML) 5 ML SYRINGE
INTRAMUSCULAR | Status: DC | PRN
  Administered 2020-03-05: 60 mg via INTRAVENOUS

## 2020-03-05 MED ORDER — PANTOPRAZOLE SODIUM 40 MG PO TBEC
40.0000 mg | DELAYED_RELEASE_TABLET | Freq: Every day | ORAL | Status: DC
  Administered 2020-03-06 – 2020-03-07 (×2): 40 mg via ORAL
  Filled 2020-03-05 (×2): qty 1

## 2020-03-05 MED ORDER — FENTANYL CITRATE (PF) 100 MCG/2ML IJ SOLN
25.0000 ug | INTRAMUSCULAR | Status: DC | PRN
  Administered 2020-03-05: 50 ug via INTRAVENOUS
  Administered 2020-03-05: 25 ug via INTRAVENOUS
  Administered 2020-03-05: 50 ug via INTRAVENOUS

## 2020-03-05 MED ORDER — PROPOFOL 10 MG/ML IV BOLUS
INTRAVENOUS | Status: AC
  Filled 2020-03-05: qty 20

## 2020-03-05 MED ORDER — ACETAMINOPHEN 325 MG PO TABS
650.0000 mg | ORAL_TABLET | Freq: Four times a day (QID) | ORAL | Status: DC | PRN
  Administered 2020-03-06: 650 mg via ORAL
  Filled 2020-03-05: qty 2

## 2020-03-05 MED ORDER — EZETIMIBE 10 MG PO TABS
10.0000 mg | ORAL_TABLET | Freq: Every morning | ORAL | Status: DC
  Administered 2020-03-06 – 2020-03-07 (×2): 10 mg via ORAL
  Filled 2020-03-05 (×2): qty 1

## 2020-03-05 MED ORDER — ONDANSETRON HCL 4 MG/2ML IJ SOLN: 4.0000 mg | Freq: Once | INTRAMUSCULAR | Status: DC | PRN

## 2020-03-05 MED ORDER — POLYETHYLENE GLYCOL 3350 17 G PO PACK: 17.0000 g | PACK | Freq: Every day | ORAL | Status: DC | PRN

## 2020-03-05 MED ORDER — BRIMONIDINE TARTRATE 0.2 % OP SOLN
1.0000 [drp] | Freq: Two times a day (BID) | OPHTHALMIC | Status: DC
  Administered 2020-03-05 – 2020-03-07 (×4): 1 [drp] via OPHTHALMIC
  Filled 2020-03-05: qty 5

## 2020-03-05 MED ORDER — BISACODYL 5 MG PO TBEC: 5.0000 mg | DELAYED_RELEASE_TABLET | Freq: Every day | ORAL | Status: DC | PRN

## 2020-03-05 MED ORDER — ALBUTEROL SULFATE (2.5 MG/3ML) 0.083% IN NEBU: 2.5000 mg | INHALATION_SOLUTION | RESPIRATORY_TRACT | Status: DC | PRN

## 2020-03-05 MED ORDER — HYDRALAZINE HCL 20 MG/ML IJ SOLN: 5.0000 mg | INTRAMUSCULAR | Status: DC | PRN

## 2020-03-05 MED ORDER — FOLIC ACID 1 MG PO TABS
1.0000 mg | ORAL_TABLET | Freq: Every day | ORAL | Status: DC
  Administered 2020-03-06 – 2020-03-07 (×2): 1 mg via ORAL
  Filled 2020-03-05: qty 1

## 2020-03-05 MED ORDER — GUAIFENESIN-DM 100-10 MG/5ML PO SYRP: 10.0000 mL | ORAL_SOLUTION | ORAL | Status: DC | PRN

## 2020-03-05 MED ORDER — LEVETIRACETAM 500 MG PO TABS
500.0000 mg | ORAL_TABLET | Freq: Three times a day (TID) | ORAL | Status: DC
  Administered 2020-03-05 – 2020-03-07 (×6): 500 mg via ORAL
  Filled 2020-03-05 (×6): qty 1

## 2020-03-05 MED ORDER — OXYCODONE HCL 5 MG/5ML PO SOLN: 5.0000 mg | Freq: Once | ORAL | Status: DC | PRN

## 2020-03-05 MED ORDER — CLONAZEPAM 0.5 MG PO TABS
0.5000 mg | ORAL_TABLET | Freq: Two times a day (BID) | ORAL | Status: DC | PRN
  Filled 2020-03-05: qty 1

## 2020-03-05 MED ORDER — OXYCODONE-ACETAMINOPHEN 5-325 MG PO TABS
1.0000 | ORAL_TABLET | ORAL | Status: DC | PRN
  Administered 2020-03-05 – 2020-03-06 (×3): 2 via ORAL
  Administered 2020-03-07: 1 via ORAL
  Administered 2020-03-07: 2 via ORAL
  Filled 2020-03-05 (×2): qty 2
  Filled 2020-03-05: qty 1
  Filled 2020-03-05 (×2): qty 2

## 2020-03-05 MED ORDER — FENTANYL CITRATE (PF) 100 MCG/2ML IJ SOLN
INTRAMUSCULAR | Status: AC
  Administered 2020-03-05: 25 ug via INTRAVENOUS
  Filled 2020-03-05: qty 2

## 2020-03-05 MED ORDER — INSULIN ASPART 100 UNIT/ML ~~LOC~~ SOLN
0.0000 [IU] | Freq: Three times a day (TID) | SUBCUTANEOUS | Status: DC
  Administered 2020-03-06: 5 [IU] via SUBCUTANEOUS
  Administered 2020-03-06: 3 [IU] via SUBCUTANEOUS
  Administered 2020-03-06 – 2020-03-07 (×2): 8 [IU] via SUBCUTANEOUS
  Administered 2020-03-07: 5 [IU] via SUBCUTANEOUS
  Administered 2020-03-07: 11 [IU] via SUBCUTANEOUS

## 2020-03-05 MED ORDER — CHLORHEXIDINE GLUCONATE CLOTH 2 % EX PADS: 6.0000 | MEDICATED_PAD | Freq: Once | CUTANEOUS | Status: DC

## 2020-03-05 MED ORDER — PHENOL 1.4 % MT LIQD: 1.0000 | OROMUCOSAL | Status: DC | PRN

## 2020-03-05 MED ORDER — FENTANYL CITRATE (PF) 250 MCG/5ML IJ SOLN
INTRAMUSCULAR | Status: DC | PRN
  Administered 2020-03-05: 50 ug via INTRAVENOUS
  Administered 2020-03-05 (×4): 25 ug via INTRAVENOUS
  Administered 2020-03-05: 50 ug via INTRAVENOUS

## 2020-03-05 MED ORDER — FENTANYL CITRATE (PF) 100 MCG/2ML IJ SOLN
INTRAMUSCULAR | Status: AC
  Filled 2020-03-05: qty 2

## 2020-03-05 MED ORDER — ROSUVASTATIN CALCIUM 20 MG PO TABS
20.0000 mg | ORAL_TABLET | Freq: Every day | ORAL | Status: DC
  Administered 2020-03-06 – 2020-03-07 (×2): 20 mg via ORAL
  Filled 2020-03-05 (×2): qty 1

## 2020-03-05 MED ORDER — MIDAZOLAM HCL 2 MG/2ML IJ SOLN
INTRAMUSCULAR | Status: AC
  Filled 2020-03-05: qty 2

## 2020-03-05 MED ORDER — PROSOURCE PLUS PO LIQD
30.0000 mL | Freq: Every day | ORAL | Status: DC
  Administered 2020-03-05 – 2020-03-06 (×2): 30 mL via ORAL
  Filled 2020-03-05 (×4): qty 30

## 2020-03-05 MED ORDER — ETOMIDATE 2 MG/ML IV SOLN
INTRAVENOUS | Status: AC
  Filled 2020-03-05: qty 10

## 2020-03-05 MED ORDER — SENNOSIDES-DOCUSATE SODIUM 8.6-50 MG PO TABS
2.0000 | ORAL_TABLET | Freq: Every day | ORAL | Status: DC
  Administered 2020-03-05 – 2020-03-06 (×2): 2 via ORAL
  Filled 2020-03-05 (×2): qty 2

## 2020-03-05 MED ORDER — BACLOFEN 5 MG HALF TABLET
15.0000 mg | ORAL_TABLET | Freq: Three times a day (TID) | ORAL | Status: DC
  Administered 2020-03-05 – 2020-03-07 (×6): 15 mg via ORAL
  Filled 2020-03-05 (×7): qty 1

## 2020-03-05 MED ORDER — 0.9 % SODIUM CHLORIDE (POUR BTL) OPTIME
TOPICAL | Status: DC | PRN
  Administered 2020-03-05: 1000 mL

## 2020-03-05 MED ORDER — LIDOCAINE 2% (20 MG/ML) 5 ML SYRINGE
INTRAMUSCULAR | Status: AC
  Filled 2020-03-05: qty 5

## 2020-03-05 MED ORDER — LISINOPRIL 2.5 MG PO TABS
2.5000 mg | ORAL_TABLET | Freq: Every morning | ORAL | Status: DC
  Administered 2020-03-06 – 2020-03-07 (×2): 2.5 mg via ORAL
  Filled 2020-03-05 (×2): qty 1

## 2020-03-05 MED ORDER — ONDANSETRON HCL 4 MG/2ML IJ SOLN
INTRAMUSCULAR | Status: DC | PRN
  Administered 2020-03-05: 4 mg via INTRAVENOUS

## 2020-03-05 MED ORDER — PROPOFOL 10 MG/ML IV BOLUS
INTRAVENOUS | Status: DC | PRN
  Administered 2020-03-05: 120 mg via INTRAVENOUS

## 2020-03-05 MED ORDER — ASPIRIN 81 MG PO CHEW
81.0000 mg | CHEWABLE_TABLET | Freq: Every morning | ORAL | Status: DC
  Administered 2020-03-06 – 2020-03-07 (×2): 81 mg via ORAL
  Filled 2020-03-05 (×2): qty 1

## 2020-03-05 MED ORDER — FERROUS SULFATE 325 (65 FE) MG PO TABS
325.0000 mg | ORAL_TABLET | Freq: Every day | ORAL | Status: DC
  Administered 2020-03-06 – 2020-03-07 (×2): 325 mg via ORAL
  Filled 2020-03-05 (×2): qty 1

## 2020-03-05 MED ORDER — ORAL CARE MOUTH RINSE: 15.0000 mL | Freq: Once | OROMUCOSAL | Status: AC

## 2020-03-05 MED ORDER — EPHEDRINE 5 MG/ML INJ
INTRAVENOUS | Status: AC
  Filled 2020-03-05: qty 10

## 2020-03-05 MED ORDER — PROTAMINE SULFATE 10 MG/ML IV SOLN
INTRAVENOUS | Status: AC
  Filled 2020-03-05: qty 25

## 2020-03-05 MED ORDER — ONDANSETRON HCL 4 MG/2ML IJ SOLN: 4.0000 mg | Freq: Four times a day (QID) | INTRAMUSCULAR | Status: DC | PRN

## 2020-03-05 MED ORDER — DIVALPROEX SODIUM 500 MG PO DR TAB: 500.0000 mg | DELAYED_RELEASE_TABLET | Freq: Four times a day (QID) | ORAL | Status: DC

## 2020-03-05 SURGICAL SUPPLY — 42 items
BLADE SAW GIGLI 510 (BLADE) ×2 IMPLANT
BLADE SAW GIGLI 510MM (BLADE) ×1
BNDG ELASTIC 4X5.8 VLCR STR LF (GAUZE/BANDAGES/DRESSINGS) ×3 IMPLANT
BNDG ELASTIC 6X5.8 VLCR STR LF (GAUZE/BANDAGES/DRESSINGS) ×3 IMPLANT
BNDG GAUZE ELAST 4 BULKY (GAUZE/BANDAGES/DRESSINGS) ×6 IMPLANT
CANISTER SUCT 3000ML PPV (MISCELLANEOUS) ×3 IMPLANT
CLIP LIGATING EXTRA MED SLVR (CLIP) ×3 IMPLANT
CLIP LIGATING EXTRA SM BLUE (MISCELLANEOUS) ×3 IMPLANT
COVER SURGICAL LIGHT HANDLE (MISCELLANEOUS) ×6 IMPLANT
COVER WAND RF STERILE (DRAPES) ×3 IMPLANT
CUFF TOURN SGL QUICK 34 (TOURNIQUET CUFF)
CUFF TOURN SGL QUICK 42 (TOURNIQUET CUFF) IMPLANT
CUFF TRNQT CYL 34X4.125X (TOURNIQUET CUFF) IMPLANT
DRAIN SNY 10X20 3/4 PERF (WOUND CARE) IMPLANT
DRAPE HALF SHEET 40X57 (DRAPES) ×3 IMPLANT
DRAPE ORTHO SPLIT 77X108 STRL (DRAPES) ×6
DRAPE SURG ORHT 6 SPLT 77X108 (DRAPES) ×2 IMPLANT
ELECT CAUTERY BLADE 6.4 (BLADE) ×3 IMPLANT
ELECT REM PT RETURN 9FT ADLT (ELECTROSURGICAL) ×3
ELECTRODE REM PT RTRN 9FT ADLT (ELECTROSURGICAL) ×1 IMPLANT
EVACUATOR SILICONE 100CC (DRAIN) IMPLANT
GAUZE SPONGE 4X4 12PLY STRL (GAUZE/BANDAGES/DRESSINGS) ×4 IMPLANT
GAUZE XEROFORM 5X9 LF (GAUZE/BANDAGES/DRESSINGS) ×3 IMPLANT
GLOVE SS BIOGEL STRL SZ 7.5 (GLOVE) ×1 IMPLANT
GLOVE SUPERSENSE BIOGEL SZ 7.5 (GLOVE) ×2
GOWN STRL REUS W/ TWL LRG LVL3 (GOWN DISPOSABLE) ×3 IMPLANT
GOWN STRL REUS W/TWL LRG LVL3 (GOWN DISPOSABLE) ×9
KIT BASIN OR (CUSTOM PROCEDURE TRAY) ×3 IMPLANT
KIT TURNOVER KIT B (KITS) ×3 IMPLANT
NS IRRIG 1000ML POUR BTL (IV SOLUTION) ×3 IMPLANT
PACK GENERAL/GYN (CUSTOM PROCEDURE TRAY) ×3 IMPLANT
PAD ARMBOARD 7.5X6 YLW CONV (MISCELLANEOUS) ×6 IMPLANT
PADDING CAST COTTON 6X4 STRL (CAST SUPPLIES) IMPLANT
STAPLER VISISTAT 35W (STAPLE) ×3 IMPLANT
STOCKINETTE IMPERVIOUS LG (DRAPES) ×3 IMPLANT
SUT ETHILON 3 0 PS 1 (SUTURE) IMPLANT
SUT VIC AB 0 CT1 18XCR BRD 8 (SUTURE) ×2 IMPLANT
SUT VIC AB 0 CT1 8-18 (SUTURE) ×6
SUT VICRYL AB 2 0 TIES (SUTURE) ×3 IMPLANT
TOWEL GREEN STERILE (TOWEL DISPOSABLE) ×6 IMPLANT
UNDERPAD 30X36 HEAVY ABSORB (UNDERPADS AND DIAPERS) ×3 IMPLANT
WATER STERILE IRR 1000ML POUR (IV SOLUTION) ×3 IMPLANT

## 2020-03-05 NOTE — Transfer of Care (Signed)
Immediate Anesthesia Transfer of Care Note  Patient: [name]  Procedure(s) Performed: LEFT ABOVE KNEE AMPUTATION (Left Knee)  Patient Location: PACU  Anesthesia Type:General  Level of Consciousness: drowsy, patient cooperative and responds to stimulation  Airway & Oxygen Therapy: Patient Spontanous Breathing  Post-op Assessment: Report given to RN and Post -op Vital signs reviewed and stable  Post vital signs: Reviewed and stable  Last Vitals:  Vitals Value Taken Time  BP 116/66 03/05/20 1458  Temp    Pulse 87 03/05/20 1500  Resp 15 03/05/20 1500  SpO2 99 % 03/05/20 1500  Vitals shown include unvalidated device data.  Last Pain:  Vitals:   03/05/20 0900  TempSrc:   PainSc: 0-No pain         Complications: No complications documented.

## 2020-03-05 NOTE — Anesthesia Postprocedure Evaluation (Signed)
Anesthesia Post Note  Patient: [name]  Procedure(s) Performed: LEFT ABOVE KNEE AMPUTATION (Left Knee)     Patient location during evaluation: PACU Anesthesia Type: General Level of consciousness: awake and lethargic Pain management: pain level controlled Vital Signs Assessment: post-procedure vital signs reviewed and stable Respiratory status: spontaneous breathing, nonlabored ventilation, respiratory function stable and patient connected to nasal cannula oxygen Cardiovascular status: blood pressure returned to baseline and stable Postop Assessment: no apparent nausea or vomiting Anesthetic complications: no   No complications documented.  Last Vitals:  Vitals:   03/05/20 1530 03/05/20 1545  BP: (!) 114/57 110/63  Pulse: 88 89  Resp: 16 12  Temp:    SpO2: 98% 96%    Last Pain:  Vitals:   03/05/20 1500  TempSrc:   PainSc: Asleep                 Tishara Pizano COKER

## 2020-03-05 NOTE — Anesthesia Procedure Notes (Signed)
Procedure Name: LMA Insertion Date/Time: 03/05/2020 1:13 PM Performed by: Drema Pry, CRNA Pre-anesthesia Checklist: Patient identified, Emergency Drugs available, Suction available and Patient being monitored Patient Re-evaluated:Patient Re-evaluated prior to induction Oxygen Delivery Method: Circle System Utilized Preoxygenation: Pre-oxygenation with 100% oxygen Induction Type: IV induction Ventilation: Mask ventilation without difficulty LMA: LMA inserted LMA Size: 4.0 Number of attempts: 1 Airway Equipment and Method: Bite block Placement Confirmation: positive ETCO2 Tube secured with: Tape Dental Injury: Teeth and Oropharynx as per pre-operative assessment

## 2020-03-05 NOTE — H&P (Signed)
History and Physical    [name] KGU:[id number] DOB: [birth year] DOA: 03/05/2020  PCP: Pcp, No Consultants:  Early - vascular surgery; Leanord Hawking - wound care; McKenzie - urology Patient coming from: [facility]; NOK: Wife, [name], [phone]  Chief Complaint: BKA  HPI: [name] is a 59 y.o. male with medical history significant of hemiplegia and aphasia from CVA (2009); neurogenic bladder with suprapubic catheter; DM; HTN; HLD; and OCD presenting for BKA, ended up with AKA.  Procedure went well and without complication.  The patient is mostly non-verbal other than occasional groans.   Hospital Course:  L AKA today.  New RBBB in OR, anesthesia to call SNF to determine if new.  Plan is back to SNF ASAP.   Review of Systems: Unable to perform  Ambulatory Status:  Non-ambulatory  COVID Vaccine Status:  Unknown  Past Medical History:  Diagnosis Date  . Anemia   . Chronic indwelling Foley catheter   . Depression   . Diabetes mellitus    Type II  . DVT (deep venous thrombosis) (HCC)   . GERD (gastroesophageal reflux disease)   . Hemiplegia (HCC)   . Hyperlipemia   . Hypertension   . Neurogenic bladder   . OCD (obsessive compulsive disorder)   . Pneumonia   . Seizures (HCC)    since a head injury in high school- no recent sezizures  . Stroke [facility])    deficit in right arm & right leg    Past Surgical History:  Procedure Laterality Date  . EYE SURGERY     cataract removal left eye  . INSERTION OF SUPRAPUBIC CATHETER N/A 06/23/2017   Procedure: INSERTION OF SUPRAPUBIC CATHETER;  Surgeon: Malen Gauze, MD;  Location: [facility];  Service: Urology;  Laterality: N/A;  . MULTIPLE EXTRACTIONS WITH ALVEOLOPLASTY N/A 05/30/2013   Procedure: MULTIPLE EXTRACTION Teeth number Six, Eight, Nine, Ten, Eleven, Thirteen, Nineteen, and Twenty Eight  WITH ALVEOLOPLASTY;  Surgeon: Georgia Lopes, DDS;  Location: [facility] OR;  Service: Oral Surgery;  Laterality: N/A;  .  MULTIPLE TOOTH EXTRACTIONS    . SUPRAPUBIC CATHETER INSERTION  2009  . TOOTH EXTRACTION N/A 10/16/2016   Procedure: DENTAL RESTORATION/EXTRACTIONS NUMBER FIVE, FOURTEEN, TWENTY-TWO, TWENTY- THREE, TWENTY-FOUR, TWENTY-FIVE, TWENTY-SIX, TWENTY-SEVEN, THIRTY-ONE, AND ALVEOLOPLASTY.;  Surgeon: Ocie Doyne, DDS;  Location: [facility] OR;  Service: Oral Surgery;  Laterality: N/A;  . TRACHEOSTOMY  2009   also removal of     Social History   Socioeconomic History  . Marital status: Married    Spouse name: Not on file  . Number of children: Not on file  . Years of education: Not on file  . Highest education level: Not on file  Occupational History  . Not on file  Tobacco Use  . Smoking status: Former Games developer  . Smokeless tobacco: Never Used  Substance and Sexual Activity  . Alcohol use: No  . Drug use: No  . Sexual activity: Never  Other Topics Concern  . Not on file  Social History Narrative  . Not on file   Social Determinants of Health   Financial Resource Strain:   . Difficulty of Paying Living Expenses: Not on file  Food Insecurity:   . Worried About Programme researcher, broadcasting/film/video in the Last Year: Not on file  . Ran Out of Food in the Last Year: Not on file  Transportation Needs:   . Lack of Transportation (Medical): Not on file  . Lack of Transportation (Non-Medical): Not on  file  Physical Activity:   . Days of Exercise per Week: Not on file  . Minutes of Exercise per Session: Not on file  Stress:   . Feeling of Stress : Not on file  Social Connections:   . Frequency of Communication with Friends and Family: Not on file  . Frequency of Social Gatherings with Friends and Family: Not on file  . Attends Religious Services: Not on file  . Active Member of Clubs or Organizations: Not on file  . Attends Banker Meetings: Not on file  . Marital Status: Not on file  Intimate Partner Violence:   . Fear of Current or Ex-Partner: Not on file  . Emotionally Abused: Not on file  .  Physically Abused: Not on file  . Sexually Abused: Not on file    No Known Allergies  History reviewed. No pertinent family history.  Prior to Admission medications   Medication Sig Start Date End Date Taking? Authorizing Provider  acetaminophen (TYLENOL) 500 MG tablet Take 500 mg by mouth every 8 (eight) hours as needed for moderate pain.    Yes [provider]  Amino Acids-Protein Hydrolys (FEEDING SUPPLEMENT, PRO-STAT SUGAR FREE 64,) LIQD Take 30 mLs by mouth every morning.   Yes [provider]  baclofen (LIORESAL) 10 MG tablet Take 15 mg by mouth 3 (three) times daily.     Yes [provider]  barrier cream (NON-SPECIFIED) CREA Apply 1 application topically as needed (APPLY AFTER EACH CHANGING).   Yes [provider]  bisacodyl (DULCOLAX) 5 MG EC tablet Take 10 mg by mouth daily as needed (FOR CONSTIPATION).    Yes [provider]  brimonidine (ALPHAGAN) 0.2 % ophthalmic solution Place 1 drop into both eyes 2 (two) times daily.   Yes [provider]  calcium-vitamin D (OSCAL WITH D) 500-200 MG-UNIT tablet Take 1 tablet by mouth.   Yes [provider]  CALMOSEPTINE 0.44-20.6 % OINT Apply  liberally to affected area as directed  apply to buttocks/gluteal folds every shift 06/01/19  Yes [provider]  clonazePAM (KLONOPIN) 0.5 MG tablet Take 0.5 mg by mouth 2 (two) times daily as needed. 09/05/19  Yes [provider]  divalproex (DEPAKOTE) 500 MG DR tablet Take 500 mg by mouth 4 (four) times daily.    Yes [provider]  doxycycline (VIBRAMYCIN) 100 MG capsule Take 100 mg by mouth daily. 10/26/19  Yes [provider]  escitalopram (LEXAPRO) 20 MG tablet Take 20 mg by mouth every morning.   Yes [provider]  ezetimibe (ZETIA) 10 MG tablet Take 10 mg by mouth every morning.   Yes [provider]  famotidine (PEPCID) 20 MG tablet Take 20 mg by mouth every morning.    Yes  [provider]  Fe-Succ-C-Thre-B12-Des Stomach (MULTIGEN PO) Take 1 tablet by mouth every morning.   Yes [provider]  ferrous sulfate 325 (65 FE) MG tablet Take 325 mg by mouth daily with breakfast.   Yes [provider]  folic acid (FOLVITE) 1 MG tablet Take 1 mg by mouth daily. 06/01/19  Yes [provider]  guaiFENesin-dextromethorphan (ROBITUSSIN DM) 100-10 MG/5ML syrup Take 10 mLs by mouth every 4 (four) hours as needed for cough.   Yes [provider]  insulin glargine (LANTUS) 100 UNIT/ML injection Inject 95 Units into the skin every morning.    Yes [provider]  insulin lispro (HUMALOG) 100 UNIT/ML injection Inject 12 Units into the skin 3 (  three) times daily before meals.   Yes [provider]  lisinopril (PRINIVIL,ZESTRIL) 2.5 MG tablet Take 2.5 mg by mouth every morning.   Yes [provider]  Menthol, Topical Analgesic, (BIOFREEZE EX) Apply 1 application topically 2 (two) times daily as needed (FOR HIP/LEG PAIN). 5% TOPICAL SOLUTION   Yes [provider]  metFORMIN (GLUCOPHAGE) 1000 MG tablet Take 1,000-1,500 mg by mouth 2 (two) times daily. 1000 MG IN THE EVENING & 1500 MG IN THE MORNING (1000 &1700)   Yes [provider]  MILK OF MAGNESIA 400 MG/5ML suspension SMARTSIG:30 Milliliter(s) By Mouth Every Night PRN 06/12/19  Yes [provider]  Multiple Vitamins-Minerals (CERTAGEN) tablet Take 1 tablet by mouth daily.   Yes [provider]  MYRBETRIQ 50 MG TB24 tablet Take 50 mg by mouth daily. 08/28/19  Yes [provider]  omega-3 acid ethyl esters (LOVAZA) 1 G capsule Take 2 g by mouth 2 (two) times daily. (1000&2200)   Yes [provider]  ondansetron (ZOFRAN-ODT) 4 MG disintegrating tablet Take 4 mg by mouth every 4 (four) hours as needed for nausea or vomiting.   Yes [provider]  oxyCODONE 10 MG TABS Take 1 tablet (10 mg total) by mouth every 4  (four) hours as needed for severe pain or breakthrough pain. 06/23/17  Yes McKenzie, Mardene Celeste, MD  polyvinyl alcohol (LIQUIFILM TEARS) 1.4 % ophthalmic solution Place 1 drop into both eyes 2 (two) times daily.   Yes [provider]  pregabalin (LYRICA) 100 MG capsule Take 100 mg by mouth 3 (three) times daily.    Yes [provider]  rosuvastatin (CRESTOR) 20 MG tablet Take 20 mg by mouth daily.   Yes [provider]  senna-docusate (SENOKOT-S) 8.6-50 MG tablet Take 2 tablets by mouth daily at 10 pm.   Yes [provider]  solifenacin (VESICARE) 10 MG tablet Take 10 mg by mouth every morning.   Yes [provider]  TYLENOL 325 MG CAPS Take 650 mg by mouth daily before breakfast. 06/01/19  Yes [provider]  vitamin C (ASCORBIC ACID) 500 MG tablet Take 500 mg by mouth 2 (two) times daily.   Yes [provider]  Vitamin D, Ergocalciferol, (DRISDOL) 50000 units CAPS capsule Take 50,000 Units by mouth every 30 (thirty) days.   Yes [provider]  aspirin 81 MG chewable tablet Chew 81 mg by mouth every morning.  Patient not taking: Reported on 03/04/2020    [provider]    Physical Exam: Vitals:   03/05/20 1630 03/05/20 1645 03/05/20 1730 03/05/20 1806  BP: 104/60 112/67  (!) 123/58  Pulse: 88 88  87  Resp: 10 (!) 24  18  Temp:   97.6 F (36.4 C) 97.7 F (36.5 C)  TempSrc:    Oral  SpO2: 98% 99%  97%  Weight:      Height:         . General:  Somnolent, opens eyes to command but does not fix gaze . Eyes: normal lids, iris . ENT:  grossly normal lips; would not follow command to open mouth or stick out tongue . Neck:  no LAD, masses or thyromegaly . Cardiovascular:  RRR, no m/r/g. . Respiratory:   CTA bilaterally with no wheezes/rales/rhonchi.  Normal respiratory effort. . Abdomen:  soft, NT, ND, NABS; +suprapubic cath . Skin:  no rash or induration seen on limited exam . Musculoskeletal:  RUE and RLE  contractures, s/p L AKA .  Psychiatric: non-verbal Neurologic:  Unable to perform   Radiological Exams on Admission: Independently reviewed - see discussion in A/P where applicable  No results found.  EKG: not done   Labs on Admission: I have personally reviewed the available labs and imaging studies at the time of the admission.  Pertinent labs:   Na++ 132 Glucose 218 BUN 38/Creatinine 1.29/GFR >60 - stable AP 138 Albumin 2.3 AST 44/ALT 86 WBC 10.8 Hgb 10.1 INR 1.1 UA: 50 glucose, small Hgb, large LE, 30 protein, few bacteria COVID negative   Assessment/Plan Principal Problem:   Unilateral AKA (HCC) Active Problems:   Type 2 diabetes mellitus with hyperglycemia, with long-term current use of insulin (HCC)   HTN (hypertension)   Dyslipidemia   History of CVA with residual deficit    L AKA -Patient with recurrent, nonhealing wounds on LLE -He is now s/o AKA -Management per vascular surgery -[name]  H/o CVA with functional quadriplegia -Patient with severe sequelae including cognitive impairment, aphasia, and hemiplegia with contractures -This patient has a baseline chronic medical condition that requires total care by caregivers (but without underlying spinal cord injury) -The patient has demonstrated inability to feed/groom herself, is nonambulatory, has contractures -His functional quadriplegia places her at higher risks for complications from acute illness and requires greater resources for his care -Palliative care consult requested for goals of care -Continue ASA -Nutrition consult -h/o post-CVA seizures, continue Depakote -h/o depression, continue klonopin, Lexapro -Continue oxycodone and Lyrica for pain, add prn morphine  HTN with h/o stage 3a CKD -Continue Lisinopril -Stable renal function at this time  DM -Will check A1c -Hold Glucophage -Continue Lantus -Cover with moderate-scale  SSI  HLD -Continue Zetia and Crestor -Hold Lovaza due to limited inpatient utility    Note: This patient has been tested and is negative for the novel coronavirus COVID-19.    DVT prophylaxis:  Heparin Code Status:  Full Family Communication: None present Disposition Plan:  The patient is from: SNF  Anticipated d/c is to: SNF  Anticipated d/c date will depend on clinical response to treatment, but likely 2-3 days  Patient is currently: acutely ill Consults called: Vascular surgery; palliative care; TOC team, nutrition Admission status:  Admit - It is my clinical opinion that admission to INPATIENT is reasonable and necessary because of the expectation that this patient will require hospital care that crosses at least 2 midnights to treat this condition based on the medical complexity of the problems presented.  Given the aforementioned information, the predictability of an adverse outcome is felt to be significant.    Jonah BlueJennifer Antwain Caliendo MD [facility]   How to contact the [facility] Attending or Consulting provider 7A - 7P or covering provider during after hours 7P -7A, for this patient?  1. Check the care team in [facility] and look for a) attending/consulting [facility] provider listed and b) the [facility] team listed 2. Log into [email or web address] and use [place]'s universal password to access. If you do not have the password, please contact the hospital operator. 3. Locate the [facility] provider you are looking for under [facility] and page to a number that you can be directly reached. 4. If you still have difficulty reaching the provider, please page the [facility] (Director on Call) for the Hospitalists listed on amion for assistance.   03/05/2020, 6:14 PM

## 2020-03-05 NOTE — Op Note (Signed)
    OPERATIVE REPORT  DATE OF SURGERY: 03/05/2020  PATIENT: [name], 59 y.o. male MRN: [id number]  DOB: [birth year]  PRE-OPERATIVE DIAGNOSIS: Gangrene left foot  POST-OPERATIVE DIAGNOSIS:  Same  PROCEDURE: Left above-knee amputation  SURGEON:  Gretta Began, M.D.  PHYSICIAN ASSISTANT: Rhyne PAC  The assistant was needed for exposure and to expedite the case  ANESTHESIA: General  EBL: per anesthesia record  Total I/O In: 900 [I.V.:900] Out: 50 [Blood:50]  BLOOD ADMINISTERED: none  DRAINS: none  SPECIMEN: none  COUNTS CORRECT:  YES  PATIENT DISPOSITION:  PACU - hemodynamically stable  PROCEDURE DETAILS: Patient was taken up and placed to position with area of the left leg prepped draped in sterile fashion.  A fishmouth incision was made on the distal thigh and carried down through the subcutaneous tissue and fascia.  The patient was minimal subcutaneous tissue.  The muscle was all viable.  The muscle was divided in line with the skin incision.  Small vessel branches were controlled with electrocautery.  Others were controlled with 0 Vicryl ties.  The neurovascular bundle was exposed.  The superficial femoral artery was patent.  This was doubly ligated with 0 Vicryl ties.  The femoral vein was also patent and it was doubly ligated with 0 Vicryl ties.  The sciatic nerve was mobilized further proximally and was ligated and divided.  Periosteum was elevated on the femur.  The femur was divided with a Gigli saw.  The bone edges were smoothed with a bone rasp.  The wound was irrigated with saline and hemostasis was obtained electrocautery.  The anterior fascia was closed to the posterior fascia with interrupted 0 Vicryl figure-of-eight sutures.  The skin was closed with skin staples.  Sterile dressing and Ace wrap were applied and the patient was taken to the recovery room in stable condition   Larina Earthly, M.D., [facility] 03/05/2020 3:34 PM

## 2020-03-05 NOTE — Interval H&P Note (Signed)
History and Physical Interval Note:  03/05/2020 10:51 AM  [name]  has presented today for surgery, with the diagnosis of NON-HEALING LEFT LOWER EXTREMITY WOUND.  The various methods of treatment have been discussed with the patient and family. After consideration of risks, benefits and other options for treatment, the patient has consented to  Procedure(s): LEFT ABOVE KNEE AMPUTATION (Left) as a surgical intervention.  The patient's history has been reviewed, patient examined, no change in status, stable for surgery.  I have reviewed the patient's chart and labs.  Questions were answered to the patient's satisfaction.     Gretta Began

## 2020-03-05 NOTE — OR Nursing (Signed)
Pt is awake,alert and at baseline.Pt and/or family verbalized understanding of poc and discharge instructions. Reviewed admission and on going care with receiving RN. Pt is in NAD at this time and is ready to be transferred to floor. Will con't to monitor until pt is transferred.

## 2020-03-06 ENCOUNTER — Encounter (HOSPITAL_COMMUNITY): Payer: Self-pay | Admitting: Vascular Surgery

## 2020-03-06 DIAGNOSIS — S78119A Complete traumatic amputation at level between unspecified hip and knee, initial encounter: Secondary | ICD-10-CM

## 2020-03-06 LAB — CBC
HCT: 29.1 % — ABNORMAL LOW (ref 39.0–52.0)
Hemoglobin: 9.2 g/dL — ABNORMAL LOW (ref 13.0–17.0)
MCH: 26.7 pg (ref 26.0–34.0)
MCHC: 31.6 g/dL (ref 30.0–36.0)
MCV: 84.6 fL (ref 80.0–100.0)
Platelets: 300 10*3/uL (ref 150–400)
RBC: 3.44 MIL/uL — ABNORMAL LOW (ref 4.22–5.81)
RDW: 14.6 % (ref 11.5–15.5)
WBC: 11.8 10*3/uL — ABNORMAL HIGH (ref 4.0–10.5)
nRBC: 0 % (ref 0.0–0.2)

## 2020-03-06 LAB — BASIC METABOLIC PANEL
Anion gap: 11 (ref 5–15)
BUN: 33 mg/dL — ABNORMAL HIGH (ref 6–20)
CO2: 24 mmol/L (ref 22–32)
Calcium: 8.8 mg/dL — ABNORMAL LOW (ref 8.9–10.3)
Chloride: 98 mmol/L (ref 98–111)
Creatinine, Ser: 1.14 mg/dL (ref 0.61–1.24)
GFR, Estimated: >60 mL/min (ref >60)
Glucose, Bld: 247 mg/dL — ABNORMAL HIGH (ref 70–99)
Potassium: 4.5 mmol/L (ref 3.5–5.1)
Sodium: 133 mmol/L — ABNORMAL LOW (ref 135–145)

## 2020-03-06 LAB — GLUCOSE, CAPILLARY
Glucose-Capillary: 164 mg/dL — ABNORMAL HIGH (ref 70–99)
Glucose-Capillary: 272 mg/dL — ABNORMAL HIGH (ref 70–99)
Glucose-Capillary: 243 mg/dL — ABNORMAL HIGH (ref 70–99)
Glucose-Capillary: 248 mg/dL — ABNORMAL HIGH (ref 70–99)

## 2020-03-06 LAB — MRSA PCR SCREENING: MRSA by PCR: POSITIVE — AB

## 2020-03-06 MED ORDER — CHLORHEXIDINE GLUCONATE CLOTH 2 % EX PADS
6.0000 | MEDICATED_PAD | Freq: Every day | CUTANEOUS | Status: DC
  Administered 2020-03-06 – 2020-03-07 (×2): 6 via TOPICAL

## 2020-03-06 MED ORDER — CHLORHEXIDINE GLUCONATE CLOTH 2 % EX PADS: 6.0000 | MEDICATED_PAD | Freq: Every day | CUTANEOUS | Status: DC

## 2020-03-06 MED ORDER — ADULT MULTIVITAMIN W/MINERALS CH
1.0000 | ORAL_TABLET | Freq: Every day | ORAL | Status: DC
  Administered 2020-03-06 – 2020-03-07 (×2): 1 via ORAL
  Filled 2020-03-06 (×2): qty 1

## 2020-03-06 MED ORDER — MUPIROCIN 2 % EX OINT
1.0000 "application " | TOPICAL_OINTMENT | Freq: Two times a day (BID) | CUTANEOUS | Status: DC
  Administered 2020-03-06 – 2020-03-07 (×3): 1 via NASAL
  Filled 2020-03-06: qty 22

## 2020-03-06 MED ORDER — ENSURE MAX PROTEIN PO LIQD
11.0000 [oz_av] | Freq: Two times a day (BID) | ORAL | Status: DC
  Administered 2020-03-06 – 2020-03-07 (×2): 11 [oz_av] via ORAL
  Filled 2020-03-06 (×3): qty 330

## 2020-03-06 MED ORDER — PROSOURCE PLUS PO LIQD
30.0000 mL | Freq: Two times a day (BID) | ORAL | Status: DC
  Administered 2020-03-06 – 2020-03-07 (×2): 30 mL via ORAL
  Filled 2020-03-06 (×3): qty 30

## 2020-03-06 NOTE — Progress Notes (Signed)
Initial Nutrition Assessment  DOCUMENTATION CODES:   Not applicable  INTERVENTION:  Increase to 29ml Prosource Plus po BID, each supplement provides 100 kcal and 15 grams protein  Ensure Max po BID, each supplement provides 150 kcal and 30 grams of protein  MVI daily  NUTRITION DIAGNOSIS:   Increased nutrient needs related to post-op healing as evidenced by estimated needs.    GOAL:   Patient will meet greater than or equal to 90% of their needs    MONITOR:   PO intake, Supplement acceptance, Skin, Weight trends, Labs, I & O's  REASON FOR ASSESSMENT:   Consult Other (Comment) ("nutritional goals")  ASSESSMENT:   Pt with a PMH significant of hemiplegia and aphasia from CVA (2009), neurogenic bladder and suprapubic catheter, DM, HTN, HLD, and OCD presented for BKA which ended up being an L AKA.  Pt unavailable at time of RD visit x2 attempts. Pt is noted to be mostly non-verbal other than occasional groans. Pt is to return to SNF per MD.   PO Intake: 30-50% x 2 recorded meals  UOP: x24 hours  Labs: Na 133 (L), CBGs 164-272 (Diabetes Coordinator following) Medications: 22ml Prosource Plus daily, colace, pepcid, ferrous sulfate, folvite, ss novolog, 75 units lantus daily, protonix, senokot-s IVF: LR @75ml /hr  Diet Order:   Diet Order            DIET DYS 2 Room service appropriate? Yes with Assist; Fluid consistency: Thin  Diet effective now                 EDUCATION NEEDS:   No education needs have been identified at this time  Skin:  Skin Assessment: Skin Integrity Issues: Skin Integrity Issues:: Incisions Incisions: L leg  Last BM:  PTA  Height:   Ht Readings from Last 1 Encounters:  03/05/20 6\' 1"  (1.854 m)    Weight:   Wt Readings from Last 1 Encounters:  03/05/20 95.7 kg   BMI:  Body mass index is 27.84 kg/m.  Estimated Nutritional Needs:   Kcal:  2400-2600  Protein:  120-135 grams  Fluid:  >2L    , MS,  RD, LDN RD pager number and weekend/on-call pager number located in Amion.

## 2020-03-06 NOTE — Progress Notes (Addendum)
    Patient comfortable with family present this am.  Left AKA dressing clean and dry Lungs non labored breathing  S/P left AKA POD # 1 Plan to change dressing tomorrow   Mosetta Pigeon PA-C  I have examined the patient, reviewed and agree with above.  Gretta Began, MD 03/06/2020 4:27 PM

## 2020-03-06 NOTE — Progress Notes (Signed)
PROGRESS NOTE    [name]  IOE:[id number] DOB: 1960-09-30 DOA: 03/05/2020 PCP: Pcp, No     Brief Narrative:  This 59 years old male with medical history significant for Right hemiplegia and aphasia from CVA in 2009,  neurogenic bladder with suprapubic catheter, diabetes, hypertension, hyperlipidemia and obesity presenting for left BKA ended up with AKA,  procedure went well and without complication.  Patient is mostly nonverbal other than occasional groans.  Assessment & Plan:   Principal Problem:   Unilateral AKA (HCC) Active Problems:   Type 2 diabetes mellitus with hyperglycemia, with long-term current use of insulin (HCC)   HTN (hypertension)   Dyslipidemia   History of CVA with residual deficit   LEFT Above Knee Amputation: -Patient with recurrent, nonhealing wounds on LLE. -He is now s/o AKA -Management per vascular surgery. -plan for dressing change tomorrow. -He will be discharged back to [facility] when medically stable.  H/o CVA with functional quadriplegia: -Patient with severe sequelae including cognitive impairment, aphasia, and hemiplegia with contractures -This patient has a baseline chronic medical condition that requires total care by caregivers (but without underlying spinal cord injury) -The patient has demonstrated inability to feed/groom herself, is nonambulatory, has contractures -His functional quadriplegia places him at higher risks for complications from acute illness and requires greater resources for his care. -Palliative care consult requested for goals of care. -Continue ASA -Nutrition consult -h/o post-CVA seizures, continue Depakote -h/o depression, continue klonopin, Lexapro -Continue oxycodone and Lyrica for pain, add prn morphine.  HTN with h/o stage 3a CKD: -Continue Lisinopril -Stable renal function at this time.  DM Poorly controlled. - Hb A1c 8.0 -Hold Glucophage -Continue Lantus -Cover with moderate-scale  SSI  HLD -Continue Zetia and Crestor.    DVT prophylaxis:Heparin Code Status: Full Family Communication:  Wife at bed side. Disposition Plan:  Status is: Inpatient  Remains inpatient appropriate because:Inpatient level of care appropriate due to severity of illness   Dispo: The patient is from: SNF              Anticipated d/c is to: SNF              Anticipated d/c date is: 1 day              Patient currently is not medically stable to d/c.   Consultants:   Orthopeadics   Procedures: Left AKA.  Antimicrobials:  Anti-infectives (From admission, onward)   Start     Dose/Rate Route Frequency Ordered Stop   03/05/20 2130  ceFAZolin (ANCEF) IVPB 2g/100 mL premix        2 g 200 mL/hr over 30 Minutes Intravenous Every 8 hours 03/05/20 1622 03/06/20 0549   03/05/20 0826  ceFAZolin (ANCEF) IVPB 2g/100 mL premix        2 g 200 mL/hr over 30 Minutes Intravenous 30 min pre-op 03/05/20 1829 03/05/20 1326      Subjective: Patient was seen and examined at bedside.  Overnight events noted.  Patient is s/p left AKA tolerated well.  Objective: Vitals:   03/06/20 0103 03/06/20 0522 03/06/20 1025 03/06/20 1340  BP: 114/62 112/60 112/70 (!) 130/59  Pulse: 90 88 92 91  Resp: 17 20  18   Temp: 98.7 F (37.1 C)  98.3 F (36.8 C) 98.5 F (36.9 C)  TempSrc: Oral  Oral Oral  SpO2: 97% 99% 98% 99%  Weight:      Height:        Intake/Output Summary (Last 24  hours) at 03/06/2020 1753 Last data filed at 03/06/2020 1340 Gross per 24 hour  Intake 1651.62 ml  Output 2050 ml  Net -398.38 ml   Filed Weights   03/05/20 0836  Weight: 95.7 kg    Examination:  General exam: Appears calm and comfortable  Respiratory system: Clear to auscultation. Respiratory effort normal. Cardiovascular system: S1 & S2 heard, RRR. No JVD, murmurs, rubs, gallops or clicks. No pedal edema. Gastrointestinal system: Abdomen is nondistended, soft and nontender. No organomegaly or masses felt. Normal  bowel sounds heard. Central nervous system: Alert and oriented.RUE and RLE contractures,  Extremities: s/p L AKA Skin: No rashes, lesions or ulcers Psychiatry: Judgement and insight appear normal. Mood & affect appropriate.     Data Reviewed: I have personally reviewed following labs and imaging studies  CBC: Recent Labs  Lab 03/05/20 1007 03/06/20 0041  WBC 10.8* 11.8*  HGB 10.1* 9.2*  HCT 32.4* 29.1*  MCV 85.0 84.6  PLT 298 300   Basic Metabolic Panel: Recent Labs  Lab 03/05/20 1007 03/06/20 0041  NA 132* 133*  K 4.3 4.5  CL 97* 98  CO2 22 24  GLUCOSE 218* 247*  BUN 38* 33*  CREATININE 1.29* 1.14  CALCIUM 9.2 8.8*   GFR: Estimated Creatinine Clearance: 78.8 mL/min (by C-G formula based on SCr of 1.14 mg/dL). Liver Function Tests: Recent Labs  Lab 03/05/20 1007  AST 44*  ALT 86*  ALKPHOS 138*  BILITOT 0.6  PROT 7.1  ALBUMIN 2.3*   No results for input(s): LIPASE, AMYLASE in the last 168 hours. No results for input(s): AMMONIA in the last 168 hours. Coagulation Profile: Recent Labs  Lab 03/05/20 1007  INR 1.1   Cardiac Enzymes: No results for input(s): CKTOTAL, CKMB, CKMBINDEX, TROPONINI in the last 168 hours. BNP (last 3 results) No results for input(s): PROBNP in the last 8760 hours. HbA1C: Recent Labs    03/05/20 2018  HGBA1C 8.0*   CBG: Recent Labs  Lab 03/05/20 1529 03/05/20 2153 03/06/20 0738 03/06/20 1153 03/06/20 1657  GLUCAP 200* 229* 164* 272* 243*   Lipid Profile: No results for input(s): CHOL, HDL, LDLCALC, TRIG, CHOLHDL, LDLDIRECT in the last 72 hours. Thyroid Function Tests: No results for input(s): TSH, T4TOTAL, FREET4, T3FREE, THYROIDAB in the last 72 hours. Anemia Panel: No results for input(s): VITAMINB12, FOLATE, FERRITIN, TIBC, IRON, RETICCTPCT in the last 72 hours. Sepsis Labs: No results for input(s): PROCALCITON, LATICACIDVEN in the last 168 hours.  Recent Results (from the past 240 hour(s))  Aerobic Culture  (superficial specimen)     Status: None   Collection Time: 02/26/20 11:30 AM   Specimen: Wound  Result Value Ref Range Status   Specimen Description   Final    WOUND LEFT MEDIAL FOOT Performed at [facility], [address] [place], [place] [address]    Special Requests   Final    NONE Performed at [facility], [address] [place], [place] [address]    Gram Stain   Final    MODERATE WBC PRESENT,BOTH PMN AND MONONUCLEAR ABUNDANT GRAM POSITIVE COCCI ABUNDANT GRAM NEGATIVE RODS    Culture   Final    ABUNDANT METHICILLIN RESISTANT STAPHYLOCOCCUS AUREUS WITHIN MIXED CULTURE Performed at [facility], [address] [place], [place] [address]    Report Status 03/03/2020 FINAL  Final   Organism ID, Bacteria METHICILLIN RESISTANT STAPHYLOCOCCUS AUREUS  Final      Susceptibility   Methicillin resistant staphylococcus aureus -  MIC*    CIPROFLOXACIN 4 RESISTANT Resistant     ERYTHROMYCIN >=8 RESISTANT Resistant     GENTAMICIN <=0.5 SENSITIVE Sensitive     OXACILLIN >=4 RESISTANT Resistant     TETRACYCLINE >=16 RESISTANT Resistant     VANCOMYCIN <=0.5 SENSITIVE Sensitive     TRIMETH/SULFA <=10 SENSITIVE Sensitive     CLINDAMYCIN <=0.25 SENSITIVE Sensitive     RIFAMPIN <=0.5 SENSITIVE Sensitive     Inducible Clindamycin NEGATIVE Sensitive     * ABUNDANT METHICILLIN RESISTANT STAPHYLOCOCCUS AUREUS  SARS Coronavirus 2 by RT PCR (hospital order, performed in [facility]) Nasopharyngeal Nasopharyngeal Swab     Status: None   Collection Time: 03/05/20  8:31 AM   Specimen: Nasopharyngeal Swab  Result Value Ref Range Status   SARS Coronavirus 2 NEGATIVE NEGATIVE Final    Comment: (NOTE) SARS-CoV-2 target nucleic acids are NOT DETECTED.  The SARS-CoV-2 RNA is generally detectable in upper and lower respiratory specimens during the acute phase of infection. The lowest concentration of SARS-CoV-2 viral copies this assay can  detect is 250 copies / mL. A negative result does not preclude SARS-CoV-2 infection and should not be used as the sole basis for treatment or other patient management decisions.  A negative result may occur with improper specimen collection / handling, submission of specimen other than nasopharyngeal swab, presence of viral mutation(s) within the areas targeted by this assay, and inadequate number of viral copies (<250 copies / mL). A negative result must be combined with clinical observations, patient history, and epidemiological information.  Fact Sheet for Patients:   BoilerBrush.com.cyhttps://www.fda.gov/media/136312/download  Fact Sheet for Healthcare Providers: https://pope.com/https://www.fda.gov/media/136313/download  This test is not yet approved or  cleared by the [place] FDA and has been authorized for detection and/or diagnosis of SARS-CoV-2 by FDA under an Emergency Use Authorization (EUA).  This EUA will remain in effect (meaning this test can be used) for the duration of the COVID-19 declaration under Section 564(b)(1) of the Act, 21 U.S.C. section 360bbb-3(b)(1), unless the authorization is terminated or revoked sooner.  Performed at [facility], [address] [place], [place] [address]   MRSA PCR Screening     Status: Abnormal   Collection Time: 03/06/20  6:23 AM   Specimen: Nasal Mucosa; Nasopharyngeal  Result Value Ref Range Status   MRSA by PCR POSITIVE (A) NEGATIVE Final    Comment:        The GeneXpert MRSA Assay (FDA approved for NASAL specimens only), is one component of a comprehensive MRSA colonization surveillance program. It is not intended to diagnose MRSA infection nor to guide or monitor treatment for MRSA infections. CRITICAL RESULT CALLED TO, READ BACK BY AND VERIFIED WITH: RN AMY K. AT 0800 03/06/20 BY MM. Performed at [facility], [address] [place], [place] [address]      Radiology Studies: No results found.  Scheduled Meds: . (feeding  supplement) PROSource Plus  30 mL Oral BID BM  . aspirin  81 mg Oral q morning - 10a  . baclofen  15 mg Oral TID  . brimonidine  1 drop Both Eyes BID  . Chlorhexidine Gluconate Cloth  6 each Topical Daily  . Chlorhexidine Gluconate Cloth  6 each Topical Q0600  . darifenacin  15 mg Oral Daily  . docusate sodium  100 mg Oral Daily  . escitalopram  20 mg Oral q morning - 10a  . ezetimibe  10 mg Oral q morning - 10a  .  famotidine  20 mg Oral q morning - 10a  . ferrous sulfate  325 mg Oral Q breakfast  . folic acid  1 mg Oral Daily  . heparin  5,000 Units Subcutaneous Q8H  . insulin aspart  0-15 Units Subcutaneous TID WC  . insulin aspart  0-5 Units Subcutaneous QHS  . insulin glargine  75 Units Subcutaneous QHS  . levETIRAcetam  500 mg Oral TID  . lisinopril  2.5 mg Oral q morning - 10a  . mirabegron ER  50 mg Oral Daily  . multivitamin with minerals  1 tablet Oral Daily  . mupirocin ointment  1 application Nasal BID  . pantoprazole  40 mg Oral Daily  . pregabalin  100 mg Oral TID  . Ensure Max Protein  11 oz Oral BID  . rosuvastatin  20 mg Oral Daily  . senna-docusate  2 tablet Oral Q2200   Continuous Infusions: . lactated ringers 75 mL/hr at 03/06/20 1752     LOS: 1 day    Time spent:  25 mins.    Cipriano Bunker, MD [facility]   If 7PM-7AM, please contact night-coverage

## 2020-03-06 NOTE — Evaluation (Signed)
Clinical/Bedside Swallow Evaluation Patient Details  Name: JAWON DIPIERO MRN: [id number] Date of Birth: 10/23/1960  Today's Date: 03/06/2020 Time: SLP Start Time (ACUTE ONLY): 1147 SLP Stop Time (ACUTE ONLY): 1204 SLP Time Calculation (min) (ACUTE ONLY): 17 min  Past Medical History:  Past Medical History:  Diagnosis Date  . Anemia   . Chronic indwelling Foley catheter   . Depression   . Diabetes mellitus    Type II  . DVT (deep venous thrombosis) (HCC)   . GERD (gastroesophageal reflux disease)   . Hemiplegia (HCC)   . Hyperlipemia   . Hypertension   . Neurogenic bladder   . OCD (obsessive compulsive disorder)   . Pneumonia   . Seizures (HCC)    since a head injury in high school- no recent sezizures  . Stroke [facility])    deficit in right arm & right leg   Past Surgical History:  Past Surgical History:  Procedure Laterality Date  . AMPUTATION Left 03/05/2020   Procedure: LEFT ABOVE KNEE AMPUTATION;  Surgeon: Larina Earthly, MD;  Location: [facility] OR;  Service: Vascular;  Laterality: Left;  . EYE SURGERY     cataract removal left eye  . INSERTION OF SUPRAPUBIC CATHETER N/A 06/23/2017   Procedure: INSERTION OF SUPRAPUBIC CATHETER;  Surgeon: Malen Gauze, MD;  Location: [facility] ORS;  Service: Urology;  Laterality: N/A;  . MULTIPLE EXTRACTIONS WITH ALVEOLOPLASTY N/A 05/30/2013   Procedure: MULTIPLE EXTRACTION Teeth number Six, Eight, Nine, Ten, Eleven, Thirteen, Nineteen, and Twenty Eight  WITH ALVEOLOPLASTY;  Surgeon: Georgia Lopes, DDS;  Location: [facility] OR;  Service: Oral Surgery;  Laterality: N/A;  . MULTIPLE TOOTH EXTRACTIONS    . SUPRAPUBIC CATHETER INSERTION  2009  . TOOTH EXTRACTION N/A 10/16/2016   Procedure: DENTAL RESTORATION/EXTRACTIONS NUMBER FIVE, FOURTEEN, TWENTY-TWO, TWENTY- THREE, TWENTY-FOUR, TWENTY-FIVE, TWENTY-SIX, TWENTY-SEVEN, THIRTY-ONE, AND ALVEOLOPLASTY.;  Surgeon: Ocie Doyne, DDS;  Location: [facility] OR;  Service: Oral Surgery;  Laterality: N/A;  . TRACHEOSTOMY  2009    also removal of    HPI:  Pt is a 59 y.o. male with medical history significant of hemiplegia and aphasia from CVA (2009), neurogenic bladder with suprapubic catheter, DM, HTN, HLD, and OCD who presented for BKA and is s/p AKA on 11/2.   Assessment / Plan / Recommendation Clinical Impression  Pt was seen for bedside swallow evaluation. Pt has aphasia secondary to CVA in 2009; verbal output was limited and pt exhibited difficulty following commands. Pt's wife was contacted via phone for information regarding pt's history. Pt's wife reported that the pt has had multiple swallow studies and required thickened liquids in the past, but has most recently been on soft foods with ground meats and thin liquids. Per the wife, with harder foods, his mastication is prolonged and he demonstrates oral holding. Oral mechanism exam was limited due to pt's difficulty following commands, but he presented with moist oral mucosa and was edentulous. No s/sx of aspiration were noted and his swallow function appears similar to what was described as his baseline with prolonged mastication of regular textures. A dysphagia 2 diet with thin liquids is recommended at this time and SLP will see pt once more to ensure tolerance of this diet.  SLP Visit Diagnosis: Dysphagia, unspecified (R13.10)    Aspiration Risk  Mild aspiration risk    Diet Recommendation Dysphagia 2 (Fine chop);Thin liquid   Liquid Administration via: Cup;Straw Medication Administration: Crushed with puree Supervision: Patient able to self feed Compensations: Slow rate;Small sips/bites Postural Changes:  Seated upright at 90 degrees    Other  Recommendations Oral Care Recommendations: Oral care BID   Follow up Recommendations None      Frequency and Duration min 1 x/week  1 week       Prognosis Prognosis for Safe Diet Advancement: Fair Barriers to Reach Goals: Time post onset;Language deficits      Swallow Study   General Date of Onset:  03/05/20 HPI: Pt is a 59 y.o. male with medical history significant of hemiplegia and aphasia from CVA (2009), neurogenic bladder with suprapubic catheter, DM, HTN, HLD, and OCD who presented for BKA and is s/p AKA on 11/2. Type of Study: Bedside Swallow Evaluation Previous Swallow Assessment: none Diet Prior to this Study: Regular;Thin liquids Temperature Spikes Noted: No Respiratory Status: Room air History of Recent Intubation: No Behavior/Cognition: Alert;Cooperative;Pleasant mood Oral Cavity Assessment: Within Functional Limits Oral Care Completed by SLP: No Oral Cavity - Dentition: Edentulous Vision: Functional for self-feeding Self-Feeding Abilities: Able to feed self Patient Positioning: Upright in bed;Postural control adequate for testing Baseline Vocal Quality: Normal Volitional Cough: Cognitively unable to elicit Volitional Swallow: Able to elicit    Oral/Motor/Sensory Function Overall Oral Motor/Sensory Function: Other (comment) (Pt unable to follow all necessary commands,)   Ice Chips Ice chips: Within functional limits Presentation: Spoon   Thin Liquid Thin Liquid: Within functional limits Presentation: Straw    Nectar Thick Nectar Thick Liquid: Not tested   Honey Thick Honey Thick Liquid: Not tested   Puree Puree: Within functional limits Presentation: Spoon   Solid     Solid: Impaired Oral Phase Impairments: Impaired mastication Oral Phase Functional Implications: Impaired mastication     Joleigh Mineau I. Vear Clock, MS, CCC-SLP Acute Rehabilitation Services Office number [phone] Pager [phone]  Scheryl Marten 03/06/2020,12:07 PM

## 2020-03-06 NOTE — Progress Notes (Signed)
Palliative:  Consult received and chart reviewed. Assessed patient - patient interacts with one word responses. Wife not at bedside. Called wife with no answer, voice mail left with call back number. Will continue attempts to speak with wife to discuss GOC.  Gerlean Ren, DNP, AGNP-C Palliative Medicine Team Team Phone # [phone]  Pager # [phone]  NO CHARGE

## 2020-03-06 NOTE — Progress Notes (Signed)
Inpatient Diabetes Program Recommendations  AACE/ADA: New Consensus Statement on Inpatient Glycemic Control (2015)  Target Ranges:  Prepandial:   less than 140 mg/dL      Peak postprandial:   less than 180 mg/dL (1-2 hours)      Critically ill patients:  140 - 180 mg/dL   Lab Results  Component Value Date   GLUCAP 272 (H) 03/06/2020   HGBA1C 8.0 (H) 03/05/2020    Review of Glycemic Control Results for KAYLOB, WALLEN (MRN [id number]) as of 03/06/2020 12:33  Ref. Range 03/05/2020 15:29 03/05/2020 21:53 03/06/2020 07:38 03/06/2020 11:53  Glucose-Capillary Latest Ref Range: 70 - 99 mg/dL 628 (H) 638 (H) 177 (H) 272 (H)   Diabetes history: Type 2 DM Outpatient Diabetes medications: Lantus 95 units QD, Humalog 12 units TID Current orders for Inpatient glycemic control: Lantus 75 units QHS, Novolog 0-15 units TID, Novolog 0-5 units QHS  Inpatient Diabetes Program Recommendations:    Consider adding Novolog 4 units TID (assuming patient is consuming >50% of meal).   Thanks, Lujean Rave, MSN, RNC-OB Diabetes Coordinator [phone] (8a-5p)

## 2020-03-06 NOTE — TOC Initial Note (Signed)
Transition of Care [facility]) - Initial/Assessment Note    Patient Details  Name: ROMEN YUTZY MRN: [id number] Date of Birth: 06/04/1960  [facility]) CM/SW Contact:    Bethann Berkshire, [place] Phone Number: 03/06/2020, 10:23 AM  Clinical Narrative:                  Met with pt and pt's wife bedside. Confirmed plan to return to [facility] likely on 03/07/20. CSW explained that he would arrange transport on following day.   CSW called [facility] and confirmed return for 03/07/20. Covid swab not needed if he returns on 03/07/20.   FL2 completed and sent to facility.   Expected Discharge Plan: Skilled Nursing Facility Barriers to Discharge: Continued Medical Work up   Patient Goals and CMS Choice Patient states their goals for this hospitalization and ongoing recovery are:: Return to [facility]      Expected Discharge Plan and Services Expected Discharge Plan: [place] arrangements for the past 2 months: [place]                                      Prior Living Arrangements/Services Living arrangements for the past 2 months: [place] Lives with:: Facility Resident Patient language and need for interpreter reviewed:: Yes        Need for Family Participation in Patient Care: Yes (Comment) Care giver support system in place?: Yes (comment)   Criminal Activity/Legal Involvement Pertinent to Current Situation/Hospitalization: No - Comment as needed  Activities of Daily Living   ADL Screening (condition at time of admission) Patient's cognitive ability adequate to safely complete daily activities?: Yes Patient able to express need for assistance with ADLs?: Yes Independently performs ADLs?: Yes (appropriate for developmental age)  Permission Sought/Granted   Permission granted to share information with : Yes, Verbal Permission Granted  Share Information with NAME: Eryn, Krejci (Spouse)  [phone] (Home Phone)           Emotional Assessment Appearance:: Appears stated age Attitude/Demeanor/Rapport: Lethargic Affect (typically observed): Appropriate, Calm Orientation: : Oriented to Self, Oriented to Place, Oriented to Situation Alcohol / Substance Use: Not Applicable Psych Involvement: No (comment)  Admission diagnosis:  Unilateral AKA ([place]) [Y58.592T] Patient Active Problem List   Diagnosis Date Noted  . Unilateral AKA ([place]) 03/05/2020  . Type 2 diabetes mellitus with hyperglycemia, with long-term current use of insulin ([place]) 11/01/2012  . HTN (hypertension) 11/01/2012  . Dyslipidemia 11/01/2012  . Convulsions/seizures ([place]) 11/01/2012  . History of CVA with residual deficit 11/01/2012  . Depressive disorder, not elsewhere classified 11/01/2012  . DVT (deep venous thrombosis) ([place]) 11/01/2012   PCP:  Merryl Hacker, No Pharmacy:   [place], [place] - [address] [place] [address] Phone: [phone] Fax: [phone]     Social Determinants of Health (SDOH) Interventions    Readmission Risk Interventions No flowsheet data found.

## 2020-03-06 NOTE — [place] FL2 (Signed)
[place] MEDICAID FL2 LEVEL OF CARE SCREENING TOOL     IDENTIFICATION  Patient Name: [name] Birthdate: 01-20-1961 Sex: male Admission Date (Current Location): 03/05/2020  [facility] and [place] Number:  Producer, television/film/video and Address:  [facility] [name], [address], [place], [place] [address]      Provider Number: [id number]  Attending Physician Name and Address:  Cipriano Bunker, MD  Relative Name and Phone Number:  [name], Rosko [facility]) [phone] [facility] Phone)    Current Level of Care: Hospital Recommended Level of Care: Skilled Nursing Facility Prior Approval Number:    Date Approved/Denied:   PASRR Number: [id number]  Discharge Plan: SNF    Current Diagnoses: Patient Active Problem List   Diagnosis Date Noted  . 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 Type 2 diabetes mellitus with hyperglycemia, with long-term current use of insulin (HCC) 11/01/2012  . HTN (hypertension) 11/01/2012  . Dyslipidemia 11/01/2012  . Convulsions/seizures (HCC) 11/01/2012  . History of CVA with residual deficit 11/01/2012  . Depressive disorder, not elsewhere classified 11/01/2012  . DVT (deep venous thrombosis) (HCC) 11/01/2012    Orientation RESPIRATION BLADDER Height & Weight     Self, Situation, Place  Normal Indwelling catheter (Suprapubic Catheter) Weight: 211 lb (95.7 kg) Height:  6\' 1"  (185.4 cm)  BEHAVIORAL SYMPTOMS/MOOD NEUROLOGICAL BOWEL NUTRITION STATUS      Continent Diet (see d/c summary)  AMBULATORY STATUS COMMUNICATION OF NEEDS Skin   Extensive Assist Verbally Surgical wounds (Incision left leg)                       Personal Care Assistance Level of Assistance  Bathing, Feeding, Dressing Bathing Assistance: Limited assistance Feeding assistance: Independent Dressing Assistance: Limited assistance     Functional Limitations Info  Sight, Hearing, Speech Sight Info: Adequate Hearing Info: Adequate Speech Info: Adequate     SPECIAL CARE FACTORS FREQUENCY                       Contractures Contractures Info: Not present    Additional Factors Info  Isolation Precautions, Insulin Sliding Scale, Allergies, Code Status Code Status Info: Full Allergies Info: no known allergies   Insulin Sliding Scale Info: insulin aspart (novoLOG) injection 0-15 Units, insulin aspart (novoLOG) injection 0-5 Units Isolation Precautions Info: MRSA- wound left foot     Current Medications (03/06/2020):  This is the current hospital active medication list Current Facility-Administered Medications  Medication Dose Route Frequency Provider Last Rate Last Admin  . (feeding supplement) PROSource Plus liquid 30 mL  30 mL Oral Daily 13/07/2019, MD   30 mL at 03/06/20 0851  . acetaminophen (TYLENOL) tablet 650 mg  650 mg Oral Q6H PRN 13/03/21, MD       Or  . acetaminophen (TYLENOL) suppository 650 mg  650 mg Rectal Q6H PRN Jonah Blue, MD      . albuterol (PROVENTIL) (2.5 MG/3ML) 0.083% nebulizer solution 2.5 mg  2.5 mg Nebulization Q2H PRN Jonah Blue, MD      . aspirin chewable tablet 81 mg  81 mg Oral q morning - 10a Jonah Blue, MD      . baclofen (LIORESAL) tablet 15 mg  15 mg Oral TID Jonah Blue, MD   15 mg at 03/05/20 2154  . bisacodyl (DULCOLAX) EC tablet 5 mg  5 mg Oral Daily PRN 2155, MD      . brimonidine (ALPHAGAN) 0.2 % ophthalmic solution  1 drop  1 drop Both Eyes BID Jonah Blue, MD   1 drop at 03/05/20 2123  . Chlorhexidine Gluconate Cloth 2 % PADS 6 each  6 each Topical Daily Jonah Blue, MD      . Chlorhexidine Gluconate Cloth 2 % PADS 6 each  6 each Topical Q0600 Cipriano Bunker, MD      . clonazePAM Scar[facility]) tablet 0.5 mg  0.5 mg Oral BID PRN Jonah Blue, MD      . darifenacin (ENABLEX) 24 hr tablet 15 mg  15 mg Oral Daily Jonah Blue, MD      . docusate sodium (COLACE) capsule 100 mg  100 mg Oral Daily Rhyne, Samantha J, PA-C      . escitalopram  (LEXAPRO) tablet 20 mg  20 mg Oral q morning - 10a Jonah Blue, MD      . ezetimibe (ZETIA) tablet 10 mg  10 mg Oral q morning - 10a Jonah Blue, MD      . famotidine (PEPCID) tablet 20 mg  20 mg Oral q morning - 10a Jonah Blue, MD      . ferrous sulfate tablet 325 mg  325 mg Oral Q breakfast Jonah Blue, MD   325 mg at 03/06/20 0850  . folic acid (FOLVITE) tablet 1 mg  1 mg Oral Daily Jonah Blue, MD      . guaiFENesin-dextromethorphan [facility] DM) 100-10 MG/5ML syrup 10 mL  10 mL Oral Q4H PRN Jonah Blue, MD      . heparin injection 5,000 Units  5,000 Units Subcutaneous Bobette Mo, MD   5,000 Units at 03/06/20 0523  . hydrALAZINE (APRESOLINE) injection 5 mg  5 mg Intravenous Q4H PRN Jonah Blue, MD      . insulin aspart (novoLOG) injection 0-15 Units  0-15 Units Subcutaneous TID WC Jonah Blue, MD   3 Units at 03/06/20 0849  . insulin aspart (novoLOG) injection 0-5 Units  0-5 Units Subcutaneous QHS Jonah Blue, MD   2 Units at 03/05/20 2153  . insulin glargine (LANTUS) injection 75 Units  75 Units Subcutaneous QHS Marikay Alar, FNP   75 Units at 03/05/20 2153  . lactated ringers infusion   Intravenous Continuous Jonah Blue, MD 75 mL/hr at 03/06/20 0700 Rate Verify at 03/06/20 0700  . levETIRAcetam (KEPPRA) tablet 500 mg  500 mg Oral TID Marikay Alar, FNP   500 mg at 03/05/20 2123  . lisinopril (ZESTRIL) tablet 2.5 mg  2.5 mg Oral q morning - 10a Jonah Blue, MD      . mirabegron ER [facility]) tablet 50 mg  50 mg Oral Daily Jonah Blue, MD      . morphine 2 MG/ML injection 2 mg  2 mg Intravenous Q2H PRN Rhyne, Ames Coupe, PA-C   2 mg at 03/06/20 0618  . mupirocin ointment (BACTROBAN) 2 % 1 application  1 application Nasal BID Cipriano Bunker, MD      . ondansetron [facility]) injection 4 mg  4 mg Intravenous Q6H PRN Rhyne, Samantha J, PA-C      . oxyCODONE (Oxy IR/ROXICODONE) immediate release tablet 10 mg  10 mg Oral Q4H PRN Jonah Blue, MD      . oxyCODONE-acetaminophen (PERCOCET/ROXICET) 5-325 MG per tablet 1-2 tablet  1-2 tablet Oral Q4H PRN Dara Lords, PA-C   2 tablet at 03/06/20 0523  . pantoprazole (PROTONIX) EC tablet 40 mg  40 mg Oral Daily Rhyne, Samantha J, PA-C      . phenol (CHLORASEPTIC) mouth spray 1  spray  1 spray Mouth/Throat PRN Rhyne, Samantha J, PA-C      . polyethylene glycol (MIRALAX / GLYCOLAX) packet 17 g  17 g Oral Daily PRN Jonah Blue, MD      . pregabalin (LYRICA) capsule 100 mg  100 mg Oral TID Jonah Blue, MD   100 mg at 03/05/20 2123  . rosuvastatin (CRESTOR) tablet 20 mg  20 mg Oral Daily Jonah Blue, MD      . senna-docusate (Senokot-S) tablet 2 tablet  2 tablet Oral Q2200 Jonah Blue, MD   2 tablet at 03/05/20 2123     Discharge Medications: Please see discharge summary for a list of discharge medications.  Relevant Imaging Results:  Relevant Lab Results:   Additional Information SSN [id number] [place], [place]

## 2020-03-07 DIAGNOSIS — Z7189 Other specified counseling: Secondary | ICD-10-CM

## 2020-03-07 DIAGNOSIS — I6930 Unspecified sequelae of cerebral infarction: Secondary | ICD-10-CM

## 2020-03-07 DIAGNOSIS — Z66 Do not resuscitate: Secondary | ICD-10-CM

## 2020-03-07 DIAGNOSIS — Z515 Encounter for palliative care: Secondary | ICD-10-CM

## 2020-03-07 LAB — HEMOGLOBIN AND HEMATOCRIT, BLOOD
HCT: 26.1 % — ABNORMAL LOW (ref 39.0–52.0)
HCT: 29 % — ABNORMAL LOW (ref 39.0–52.0)
Hemoglobin: 7.9 g/dL — ABNORMAL LOW (ref 13.0–17.0)
Hemoglobin: 9.1 g/dL — ABNORMAL LOW (ref 13.0–17.0)

## 2020-03-07 LAB — CBC
HCT: 25.4 % — ABNORMAL LOW (ref 39.0–52.0)
Hemoglobin: 7.9 g/dL — ABNORMAL LOW (ref 13.0–17.0)
MCH: 26.4 pg (ref 26.0–34.0)
MCHC: 31.1 g/dL (ref 30.0–36.0)
MCV: 84.9 fL (ref 80.0–100.0)
Platelets: 271 10*3/uL (ref 150–400)
RBC: 2.99 MIL/uL — ABNORMAL LOW (ref 4.22–5.81)
RDW: 14.6 % (ref 11.5–15.5)
WBC: 8.4 10*3/uL (ref 4.0–10.5)
nRBC: 0 % (ref 0.0–0.2)

## 2020-03-07 LAB — PREPARE RBC (CROSSMATCH)

## 2020-03-07 LAB — BASIC METABOLIC PANEL
Anion gap: 11 (ref 5–15)
BUN: 23 mg/dL — ABNORMAL HIGH (ref 6–20)
CO2: 25 mmol/L (ref 22–32)
Calcium: 8.2 mg/dL — ABNORMAL LOW (ref 8.9–10.3)
Chloride: 96 mmol/L — ABNORMAL LOW (ref 98–111)
Creatinine, Ser: 1.09 mg/dL (ref 0.61–1.24)
GFR, Estimated: >60 mL/min (ref >60)
Glucose, Bld: 290 mg/dL — ABNORMAL HIGH (ref 70–99)
Potassium: 4.8 mmol/L (ref 3.5–5.1)
Sodium: 132 mmol/L — ABNORMAL LOW (ref 135–145)

## 2020-03-07 LAB — GLUCOSE, CAPILLARY
Glucose-Capillary: 297 mg/dL — ABNORMAL HIGH (ref 70–99)
Glucose-Capillary: 315 mg/dL — ABNORMAL HIGH (ref 70–99)
Glucose-Capillary: 239 mg/dL — ABNORMAL HIGH (ref 70–99)
Glucose-Capillary: 259 mg/dL — ABNORMAL HIGH (ref 70–99)

## 2020-03-07 LAB — SURGICAL PATHOLOGY

## 2020-03-07 LAB — PHOSPHORUS: Phosphorus: 2.6 mg/dL (ref 2.5–4.6)

## 2020-03-07 LAB — MAGNESIUM: Magnesium: 1.7 mg/dL (ref 1.7–2.4)

## 2020-03-07 MED ORDER — DIPHENHYDRAMINE HCL 50 MG/ML IJ SOLN
12.5000 mg | Freq: Four times a day (QID) | INTRAMUSCULAR | Status: DC | PRN
  Administered 2020-03-07: 12.5 mg via INTRAVENOUS

## 2020-03-07 MED ORDER — OXYCODONE-ACETAMINOPHEN 5-325 MG PO TABS
1.0000 | ORAL_TABLET | ORAL | 0 refills | Status: AC | PRN
Start: 2020-03-07 — End: ?

## 2020-03-07 MED ORDER — SODIUM CHLORIDE 0.9% IV SOLUTION: Freq: Once | INTRAVENOUS | Status: DC

## 2020-03-07 NOTE — Progress Notes (Addendum)
  Progress Note    03/07/2020 7:53 AM 2 Days Post-Op  Subjective:  No major complaints   Vitals:   03/07/20 0519 03/07/20 0749  BP: 129/61 118/65  Pulse: 94 94  Resp: 17 16  Temp: 98 F (36.7 C) 98.3 F (36.8 C)  SpO2: 96% 97%   Physical Exam: Cardiac:  regular Lungs:  Non labored Incisions: left AKA intact, clean and dry. Viable flaps    Extremities:  2+ femoral pulses bilaterally, bilateral legs well perfused Abdomen:  Obese, soft Neurologic: alert and oriented  CBC    Component Value Date/Time   WBC 8.4 03/07/2020 0045   RBC 2.99 (L) 03/07/2020 0045   HGB 7.9 (L) 03/07/2020 0045   HCT 25.4 (L) 03/07/2020 0045   PLT 271 03/07/2020 0045   MCV 84.9 03/07/2020 0045   MCH 26.4 03/07/2020 0045   MCHC 31.1 03/07/2020 0045   RDW 14.6 03/07/2020 0045   LYMPHSABS 1.8 11/11/2019 1535   MONOABS 0.8 11/11/2019 1535   EOSABS 0.1 11/11/2019 1535   BASOSABS 0.0 11/11/2019 1535    BMET    Component Value Date/Time   NA 132 (L) 03/07/2020 0045   K 4.8 03/07/2020 0045   CL 96 (L) 03/07/2020 0045   CO2 25 03/07/2020 0045   GLUCOSE 290 (H) 03/07/2020 0045   BUN 23 (H) 03/07/2020 0045   CREATININE 1.09 03/07/2020 0045   CALCIUM 8.2 (L) 03/07/2020 0045   GFRNONAA >60 03/07/2020 0045   GFRAA 56 (L) 11/11/2019 1535    INR    Component Value Date/Time   INR 1.1 03/05/2020 1007     Intake/Output Summary (Last 24 hours) at 03/07/2020 0753 Last data filed at 03/07/2020 0521 Gross per 24 hour  Intake 1424.98 ml  Output 3000 ml  Net -1575.02 ml     Assessment/Plan:  59 y.o. male is s/p left above knee amputation 2 Days Post-Op. Pain well controlled. L AKA site clean dry and intact. Viable flaps. Will order biotech stockings. Okay from vascular standpoint for discharge back to [facility]. Will follow up with Dr. Arbie Cookey in 4 weeks for staple removal    Graceann Congress, PA-C [facility] [phone] 03/07/2020 7:53 AM

## 2020-03-07 NOTE — Consult Note (Signed)
Consultation Note Date: 03/07/2020   Patient Name: [name]  DOB: 05-14-1960  MRN: [id number]  Age / Sex: 59 y.o., male  PCP: Pcp, No Referring Physician: Shawna Clamp, MD  Reason for Consultation: Establishing goals of care  HPI/Patient Profile: 59 y.o. 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 PMT consulted for [place].  Clinical Assessment and Goals of Care: I have reviewed medical records including EPIC notes, labs and imaging, received report from RN, assessed the patient and then met with patient and spouse [name] to discuss diagnosis prognosis, GOC, EOL wishes, disposition and options.  I introduced Palliative Medicine as specialized medical care for people living with serious illness. It focuses on providing relief from the symptoms and stress of a serious illness. The goal is to improve quality of life for both the patient and the family.  We discussed a brief life review of the patient. 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   As far as functional and nutritional status, [name] shares that patient is essentially bedbound and dependent on others for ADLs. He does have a good appetite. Able to communicate some.     We discussed patient's current illness and what it means in the larger context of patient's on-going co-morbidities.  Natural disease trajectory and expectations at EOL were discussed. [name] understands that patient is at high risk for complications d/t his chronic health conditions.   I attempted to elicit values and goals of care important to the patient. The difference between aggressive medical intervention and comfort care was considered in light of the patient's goals of care. Advance directives, concepts specific to code status, artificial feeding and  hydration, and rehospitalization were considered and discussed.   I completed a MOST form today. The patient and family outlined their wishes for the following treatment decisions:  Cardiopulmonary Resuscitation: Do Not Attempt Resuscitation (DNR/No CPR)  Medical Interventions: Comfort Measures: Keep clean, warm, and dry. Use medication by any route, positioning, wound care, and other measures to relieve pain and suffering. Use oxygen, suction and manual treatment of airway obstruction as needed for comfort. Do not transfer to the hospital unless comfort needs cannot be met in current location.  Antibiotics: Determine use of limitation of antibiotics when infection occurs  IV Fluids: IV fluids for a defined trial period  Feeding Tube: No feeding tube    Discussed with family the importance of continued conversation with family and the medical providers regarding overall plan of care and treatment options, ensuring decisions are within the context of the patient's values and GOCs.    Questions and concerns were addressed. The family was encouraged to call with questions or concerns.   Primary Decision Maker HCPOA spouse Gee Habig  SUMMARY OF RECOMMENDATIONS   - MOST completed as above: change to DNR, no feeding tube ever, does not want to come back to hospital  - DNR placed on chart  Code Status/Advance Care Planning:  DNR  Additional Recommendations (Limitations, Scope, Preferences):  Avoid Hospitalization, No Artificial Feeding and No Tracheostomy  Prognosis:   Unable to determine  Discharge Planning: back to LTC     Primary Diagnoses: Present on Admission: . HTN (hypertension) . Dyslipidemia   I have reviewed the medical record, interviewed the patient and family, and examined the patient. The following aspects are pertinent.  Past Medical History:  Diagnosis  Date  . Anemia   . Chronic indwelling Foley catheter   . Depression   . Diabetes mellitus    Type II   . DVT (deep venous thrombosis) ([place])   . GERD (gastroesophageal reflux disease)   . Hemiplegia ([place])   . Hyperlipemia   . Hypertension   . Neurogenic bladder   . OCD (obsessive compulsive disorder)   . Pneumonia   . Seizures ([place])    since a head injury in high school- no recent sezizures  . Stroke [facility])    deficit in right arm & right leg   Social History   Socioeconomic History  . Marital status: Married    Spouse name: Not on file  . Number of children: Not on file  . Years of education: Not on file  . Highest education level: Not on file  Occupational History  . Not on file  Tobacco Use  . Smoking status: Former Research scientist (life sciences)  . Smokeless tobacco: Never Used  Substance and Sexual Activity  . Alcohol use: No  . Drug use: No  . Sexual activity: Never  Other Topics Concern  . Not on file  Social History Narrative  . Not on file   Social Determinants of Health   Financial Resource Strain:   . Difficulty of Paying Living Expenses: Not on file  Food Insecurity:   . Worried About Charity fundraiser in the Last Year: Not on file  . Ran Out of Food in the Last Year: Not on file  Transportation Needs:   . Lack of Transportation (Medical): Not on file  . Lack of Transportation (Non-Medical): Not on file  Physical Activity:   . Days of Exercise per Week: Not on file  . Minutes of Exercise per Session: Not on file  Stress:   . Feeling of Stress : Not on file  Social Connections:   . Frequency of Communication with Friends and Family: Not on file  . Frequency of Social Gatherings with Friends and Family: Not on file  . Attends Religious Services: Not on file  . Active Member of Clubs or Organizations: Not on file  . Attends Archivist Meetings: Not on file  . Marital Status: Not on file   History reviewed. No pertinent family history. Scheduled Meds: . (feeding supplement) PROSource Plus  30 mL Oral BID BM  . sodium chloride   Intravenous Once  . aspirin  81  mg Oral q morning - 10a  . baclofen  15 mg Oral TID  . brimonidine  1 drop Both Eyes BID  . Chlorhexidine Gluconate Cloth  6 each Topical Daily  . Chlorhexidine Gluconate Cloth  6 each Topical Q0600  . darifenacin  15 mg Oral Daily  . docusate sodium  100 mg Oral Daily  . escitalopram  20 mg Oral q morning - 10a  . ezetimibe  10 mg Oral q morning - 10a  . famotidine  20 mg Oral q morning - 10a  . ferrous sulfate  325 mg Oral Q breakfast  . folic acid  1 mg Oral Daily  . heparin  5,000 Units Subcutaneous Q8H  . insulin aspart  0-15 Units Subcutaneous TID WC  . insulin aspart  0-5 Units Subcutaneous QHS  . insulin glargine  75 Units Subcutaneous QHS  . levETIRAcetam  500 mg Oral TID  . lisinopril  2.5 mg Oral q morning - 10a  . mirabegron ER  50 mg Oral Daily  . multivitamin with minerals  1 tablet Oral Daily  . mupirocin ointment  1 application Nasal BID  . pantoprazole  40 mg Oral Daily  . pregabalin  100 mg Oral TID  . Ensure Max Protein  11 oz Oral BID  . rosuvastatin  20 mg Oral Daily  . senna-docusate  2 tablet Oral Q2200   Continuous Infusions: . lactated ringers 75 mL/hr at 03/07/20 0511   PRN Meds:.acetaminophen **OR** acetaminophen, albuterol, bisacodyl, clonazePAM, guaiFENesin-dextromethorphan, hydrALAZINE, morphine injection, ondansetron, oxyCODONE, oxyCODONE-acetaminophen, phenol, polyethylene glycol No Known Allergies Review of Systems  Unable to perform ROS: Patient nonverbal    Physical Exam Constitutional:      General: He is not in acute distress. Pulmonary:     Effort: Pulmonary effort is normal. No respiratory distress.  Skin:    General: Skin is warm and dry.  Neurological:     Mental Status: He is alert.  Psychiatric:        Cognition and Memory: Cognition is impaired.     Vital Signs: BP 118/65 (BP Location: Left Arm)   Pulse 94   Temp 98.3 F (36.8 C)   Resp 16   Ht _0  (1.854 m)   Wt 95.7 kg   SpO2 97%   BMI 27.84 kg/m  Pain Scale:  Faces   Pain Score: Asleep   SpO2: SpO2: 97 % O2 Device:SpO2: 97 % O2 Flow Rate: .   IO: Intake/output summary:   Intake/Output Summary (Last 24 hours) at 03/07/2020 1119 Last data filed at 03/07/2020 0950 Gross per 24 hour  Intake 1544.98 ml  Output 3000 ml  Net -1455.02 ml    LBM:   Baseline Weight: Weight: 95.7 kg Most recent weight: Weight: 95.7 kg     Palliative Assessment/Data: PPS 30%    Time Total: 60 minutes Greater than 50%  of this time was spent counseling and coordinating care related to the above assessment and plan.  Juel Burrow, DNP, AGNP-C Palliative Medicine Team [phone] Pager: [phone]

## 2020-03-07 NOTE — Discharge Instructions (Signed)
Advised to follow-up with primary care physician in 1 week. Advised to follow-up with vascular surgery in 4 weeks for wound check in staple removal. Patient is being discharged to his [facility] skilled nursing facility.

## 2020-03-07 NOTE — Progress Notes (Signed)
Pt removed dressing on incision site. Attempted to replace dressing, pt got very agitated and started yelling at this RR. Explained how we need to keep the incision site covered, pt still refused and told this rn to " Get out!".

## 2020-03-07 NOTE — Progress Notes (Signed)
[facility] arrived to pick up patient, hand off and paperwork given. PIV removed from patient and belonging transported with patient to [facility].

## 2020-03-07 NOTE — TOC Transition Note (Signed)
Transition of Care [facility]) - CM/SW Discharge Note   Patient Details  Name: [name] MRN: [id number] Date of Birth: 08/26/1960  [facility] CM/SW Contact:  Erin Sons, LCSW Phone Number: 03/07/2020, 3:41 PM   Clinical Narrative:     Patient will DC to: [facility]  Anticipated DC date: 03/07/20 Family notified:  Stanislaw, [place] (Spouse)  [phone] (Home Phone) Transport by: Sharin Mons   Per MD patient ready for DC to . RN, patient, patient's family, and facility notified of DC. Discharge Summary and FL2 sent to facility. RN to call report prior to discharge [phone]). DC packet on chart. Ambulance transport requested for patient.   CSW will sign off for now as social work intervention is no longer needed. Please consult [place] again if new needs arise.   Final next level of care: Skilled Nursing Facility Barriers to Discharge: No Barriers Identified   Patient Goals and CMS Choice Patient states their goals for this hospitalization and ongoing recovery are:: Return to [facility]      Discharge Placement                Patient to be transferred to facility by: [facility] Name of family member notified: Finlee, Milo (Spouse) [phone] [facility] Phone) Patient and family notified of of transfer: 03/07/20  Discharge Plan and Services                                     Social Determinants of Health (SDOH) Interventions     Readmission Risk Interventions No flowsheet data found.

## 2020-03-07 NOTE — Discharge Summary (Signed)
Physician Discharge Summary  [name] WUX:[id number] DOB: [birth year] DOA: 03/05/2020  PCP: Pcp, No  Admit date: 03/05/2020.  Discharge date: 03/07/2020.  Admitted From: [facility].  Disposition:  [facility].  Recommendations for Outpatient Follow-up:  1. Follow up with PCP in 1-2 weeks 2. Please obtain BMP/CBC in one week 3. Follow up Dr. Gretta Began in 3-4 weeks.  Home Health: None Equipment/Devices: None  Discharge Condition: Stable. CODE STATUS:DNR Diet recommendation:  Dysphagia 2 Diet  Brief Summary / Hospital course: This 59 years old male with medical history significant for Right hemiplegia and aphasia from CVA in 2009,  neurogenic bladder with suprapubic catheter, diabetes, hypertension, hyperlipidemia and obesity presented in the ED for the evaluation of nonhealing left foot wounds. Vascular surgery initially recommended left BKA but later ended up with AKA,  Chances of healing were better with above knee amputation. Family and patient agreed with AKA. Procedure went well and without complication.  Patient is mostly nonverbal other than occasional groans. Patient was followed by vascular surgery. His pain remains better controlled.  Patient's hemoglobin slightly dropped to 7.9 from 10.9.  Patient has recieved 1 unit of packed red blood cells.  Patient is being discharged to  [facility].  Patient will follow up with Dr. Gretta Began in 4 weeks for staple removals.  Palliative care consulted to discuss goals of care.  He was managed for below problems.   Discharge Diagnoses:  Principal Problem:   Unilateral AKA (HCC) Active Problems:   Type 2 diabetes mellitus with hyperglycemia, with long-term current use of insulin (HCC)   HTN (hypertension)   Dyslipidemia   History of CVA with residual deficit   Goals of care, counseling/discussion   Palliative care by specialist   DNR (do not resuscitate)  LEFT Above Knee  Amputation: -Patient with recurrent, nonhealing wounds on LLE. -He is now s/p AKA, tolerated well. -Management per vascular surgery. -Dressing changed, no oozing or drainage noted.   Patient is being discharged back to [facility] when medically stable.  H/o CVA with functional quadriplegia: -Patient with severe sequelae including cognitive impairment, aphasia, and hemiplegia with contractures -This patient has a baseline chronic medical condition that requires total care by caregivers (but without underlying spinal cord injury) -The patient has demonstrated inability to feed/groom herself, is nonambulatory, has contractures -Hisfunctional quadriplegia places him at higher risks for complications from acute illness and requires greater resources for hiscare. -Palliative care consult requested for goals of care. -Continue ASA -Nutrition consult -h/o post-CVA seizures, continue Depakote -h/o depression, continue klonopin, Lexapro -Continue oxycodone and Lyrica for pain, add prn morphine.  HTN with h/o stage 3a CKD: -Continue Lisinopril -Stable renal function at this time.  DM Poorly controlled. - Hb A1c 8.0 -Hold Glucophage -Continue Lantus -Cover with moderate-scale SSI  HLD -Continue Zetia and Crestor.   Discharge Instructions  Discharge Instructions    Call MD for:  difficulty breathing, headache or visual disturbances   Complete by: As directed    Call MD for:  persistant dizziness or light-headedness   Complete by: As directed    Call MD for:  severe uncontrolled pain   Complete by: As directed    Call MD for:  temperature >100.4   Complete by: As directed    Diet - low sodium heart healthy   Complete by: As directed    Diet clear liquid   Complete by: As directed    Discharge instructions  Complete by: As directed    Advised to follow-up with primary care physician in 1 week. Advised to follow-up with vascular surgery in 4 weeks for wound check in  staple removal. Patient is being discharged to his [facility] skilled nursing facility.   Discharge wound care:   Complete by: As directed    Wound care at Nursing home.   Increase activity slowly   Complete by: As directed      Allergies as of 03/07/2020   No Known Allergies     Medication List    STOP taking these medications   doxycycline 100 MG tablet Commonly known as: VIBRA-TABS   Oxycodone HCl 10 MG Tabs     TAKE these medications   acetaminophen 500 MG tablet Commonly known as: TYLENOL Take 500 mg by mouth every 8 (eight) hours as needed (for breakthrough pain).   acetaminophen 325 MG tablet Commonly known as: TYLENOL Take 650 mg by mouth in the morning.   baclofen 10 MG tablet Commonly known as: LIORESAL Take 15 mg by mouth 3 (three) times daily.   brimonidine 0.1 % Soln Commonly known as: ALPHAGAN P Place 1 drop into both eyes 2 (two) times daily.   CALCARB 600 PO Take 1 tablet by mouth daily with breakfast.   Calmoseptine 0.44-20.6 % Oint Generic drug: Menthol-Zinc Oxide Apply 1 application topically See admin instructions. Apply to buttocks and gluteal folds every eight hours as needed for protection   Certagen tablet Take 1 tablet by mouth daily.   CETAPHIL MOISTURIZING EX Apply 1 application topically See admin instructions. Apply to feet and legs every 12 hours as needed for hydration of the skin   clonazePAM 0.5 MG tablet Commonly known as: KLONOPIN Take 0.5 mg by mouth 2 (two) times daily.   escitalopram 20 MG tablet Commonly known as: LEXAPRO Take 20 mg by mouth every morning.   famotidine 20 MG tablet Commonly known as: PEPCID Take 20 mg by mouth 2 (two) times daily before a meal.   ferrous sulfate 325 (65 FE) MG tablet Take 325 mg by mouth 3 (three) times daily with meals.   folic acid 1 MG tablet Commonly known as: FOLVITE Take 1 mg by mouth daily.   HYPROMELLOSE OP Place 1 drop into both eyes 2 (two) times daily.    insulin glargine 100 UNIT/ML injection Commonly known as: LANTUS Inject 75 Units into the skin at bedtime.   insulin lispro 100 UNIT/ML injection Commonly known as: HUMALOG Inject 15 Units into the skin See admin instructions. Inject 15 units into the skin three times a day with meals and an additional 2-10 units FOUR TIMES A DAY, per sliding scale: BGL 150-199 = 2 units; 200-249 = 4 units; 250-299 = 6 units; 300-399 = 8 units; and 400-450 = 10 units   Keppra 500 MG tablet Generic drug: levETIRAcetam Take 500 mg by mouth See admin instructions. Take 500 mg by mouth at 8 AM, 2 PM, and 8 PM   lisinopril 2.5 MG tablet Commonly known as: ZESTRIL Take 2.5 mg by mouth every morning.   metFORMIN 1000 MG tablet Commonly known as: GLUCOPHAGE Take 1,000 mg by mouth 2 (two) times daily.   Milk of Magnesia 400 MG/5ML suspension Generic drug: magnesium hydroxide Take 30 mLs by mouth at bedtime as needed (for constipation/no B.M. in 3 days).   Myrbetriq 50 MG Tb24 tablet Generic drug: mirabegron ER Take 50 mg by mouth daily.   oxyCODONE-acetaminophen 5-325 MG tablet Commonly known  as: PERCOCET/ROXICET Take 1-2 tablets by mouth every 4 (four) hours as needed for moderate pain.   pregabalin 100 MG capsule Commonly known as: LYRICA Take 100 mg by mouth 3 (three) times daily.   rosuvastatin 20 MG tablet Commonly known as: CRESTOR Take 20 mg by mouth every evening.   senna-docusate 8.6-50 MG tablet Commonly known as: Senokot-S Take 1 tablet by mouth at bedtime.   vitamin C 500 MG tablet Commonly known as: ASCORBIC ACID Take 500 mg by mouth in the morning.   Vitamin D3 50 MCG (2000 UT) Tabs Take 2,000 Units by mouth in the morning.            Discharge Care Instructions  (From admission, onward)         Start     Ordered   03/07/20 0000  Discharge wound care:       Comments: Wound care at Nursing home.   03/07/20 1208          Follow-up Information    Early, Kristen Loader, MD In 4 weeks.   Specialties: Vascular Surgery, Cardiology Why: Office will call you to arrange your appt in [place] office (sent) Contact information: [address] [place] [address] [phone]              No Known Allergies  Consultations:  Vascular surgery   Procedures/Studies: US ARTERIAL ABI (SCREENING LOWER EXTREMITY)  Result Date: 02/28/2020 CLINICAL DATA:  59 year old male with nonhealing ulcer of the left lower extremity. EXAM: NONINVASIVE PHYSIOLOGIC VASCULAR STUDY OF BILATERAL LOWER EXTREMITIES TECHNIQUE: Evaluation of both lower extremities were performed at rest, including calculation of ankle-brachial indices with single level Doppler, pressure and pulse volume recording. COMPARISON:  Prior noninvasive segmental vascular evaluation 06/29/2019 FINDINGS: Right ABI: Noncompressible posterior tibial artery. No flow detected in the dorsalis pedis artery. Left ABI: Noncompressible posterior tibial artery. No flow detected in the dorsalis pedis artery. Right Lower Extremity: Abnormal monophasic arterial waveform in the posterior tibial artery. No waveform detected in the dorsalis pedis artery. Left Lower Extremity: Abnormal monophasic arterial waveform in the posterior tibial artery. No waveform detected in the dorsalis pedis artery. > 1.4 Non diagnostic secondary to incompressible vessel calcifications (medial arterial sclerosis of [place]) IMPRESSION: 1. Unable to calculate ankle-brachial indices secondary to incompressibility of the posterior tibial arteries bilaterally. 2. No flow or waveform detected in the dorsalis pedis arteries bilaterally. 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 Signed, Sterling Big, MD, RPVI [facility] Electronically Signed   By: Malachy Moan M.D.   On: 02/28/2020  11:29    Left above-knee amputation.   Subjective: Patient was seen and examined at bedside.  He reports feeling better, states pain is better controlled,  dressing has changed,  there was no oozing or drainage.  Patient wants to be discharged today.  He is getting 1 unit blood transfusion for his low hemoglobin.  Discharge Exam: Vitals:   03/07/20 0519 03/07/20 0749  BP: 129/61 118/65  Pulse: 94 94  Resp: 17 16  Temp: 98 F (36.7 C) 98.3 F (36.8 C)  SpO2: 96% 97%   Vitals:   03/06/20 1340 03/06/20 2041 03/07/20 0519 03/07/20 0749  BP: (!) 130/59 (!) 107/58 129/61 118/65  Pulse: 91 89 94 94  Resp: 18 17 17 16   Temp: 98.5 F (36.9 C) 98.4 F (36.9 C) 98 F (36.7 C)  98.3 F (36.8 C)  TempSrc: Oral Oral Oral   SpO2: 99% 98% 96% 97%  Weight:      Height:        General: Pt is alert, awake, not in acute distress Cardiovascular: RRR, S1/S2 +, no rubs, no gallops Respiratory: CTA bilaterally, no wheezing, no rhonchi Abdominal: Soft, NT, ND, bowel sounds + Extremities: Left AKA.    The results of significant diagnostics from this hospitalization (including imaging, microbiology, ancillary and laboratory) are listed below for reference.     Microbiology: Recent Results (from the past 240 hour(s))  SARS Coronavirus 2 by RT PCR (hospital order, performed in [facility] hospital lab) Nasopharyngeal Nasopharyngeal Swab     Status: None   Collection Time: 03/05/20  8:31 AM   Specimen: Nasopharyngeal Swab  Result Value Ref Range Status   SARS Coronavirus 2 NEGATIVE NEGATIVE Final    Comment: (NOTE) SARS-CoV-2 target nucleic acids are NOT DETECTED.  The SARS-CoV-2 RNA is generally detectable in upper and lower respiratory specimens during the acute phase of infection. The lowest concentration of SARS-CoV-2 viral copies this assay can detect is 250 copies / mL. A negative result does not preclude SARS-CoV-2 infection and should not be used as the sole basis for treatment  or other patient management decisions.  A negative result may occur with improper specimen collection / handling, submission of specimen other than nasopharyngeal swab, presence of viral mutation(s) within the areas targeted by this assay, and inadequate number of viral copies (<250 copies / mL). A negative result must be combined with clinical observations, patient history, and epidemiological information.  Fact Sheet for Patients:   BoilerBrush.com.cy  Fact Sheet for Healthcare Providers: https://pope.com/  This test is not yet approved or  cleared by the [place] FDA and has been authorized for detection and/or diagnosis of SARS-CoV-2 by FDA under an Emergency Use Authorization (EUA).  This EUA will remain in effect (meaning this test can be used) for the duration of the COVID-19 declaration under Section 564(b)(1) of the Act, 21 U.S.C. section 360bbb-3(b)(1), unless the authorization is terminated or revoked sooner.  Performed at [facility], [address] [place], [place] [address]   MRSA PCR Screening     Status: Abnormal   Collection Time: 03/06/20  6:23 AM   Specimen: Nasal Mucosa; Nasopharyngeal  Result Value Ref Range Status   MRSA by PCR POSITIVE (A) NEGATIVE Final    Comment:        The GeneXpert MRSA Assay (FDA approved for NASAL specimens only), is one component of a comprehensive MRSA colonization surveillance program. It is not intended to diagnose MRSA infection nor to guide or monitor treatment for MRSA infections. CRITICAL RESULT CALLED TO, READ BACK BY AND VERIFIED WITH: RN AMY K. AT 0800 03/06/20 BY MM. Performed at [facility], [address] [place], [place] [address]      Labs: BNP (last 3 results) No results for input(s): BNP in the last 8760 hours. Basic Metabolic Panel: Recent Labs  Lab 03/05/20 1007 03/06/20 0041 03/07/20 0045  NA 132* 133* 132*  K 4.3 4.5 4.8  CL  97* 98 96*  CO2 22 24 25   GLUCOSE 218* 247* 290*  BUN 38* 33* 23*  CREATININE 1.29* 1.14 1.09  CALCIUM 9.2 8.8* 8.2*  MG  --   --  1.7  PHOS  --   --  2.6   Liver Function Tests: Recent Labs  Lab 03/05/20 1007  AST  44*  ALT 86*  ALKPHOS 138*  BILITOT 0.6  PROT 7.1  ALBUMIN 2.3*   No results for input(s): LIPASE, AMYLASE in the last 168 hours. No results for input(s): AMMONIA in the last 168 hours. CBC: Recent Labs  Lab 03/05/20 1007 03/06/20 0041 03/07/20 0045 03/07/20 0930  WBC 10.8* 11.8* 8.4  --   HGB 10.1* 9.2* 7.9* 7.9*  HCT 32.4* 29.1* 25.4* 26.1*  MCV 85.0 84.6 84.9  --   PLT 298 300 271  --    Cardiac Enzymes: No results for input(s): CKTOTAL, CKMB, CKMBINDEX, TROPONINI in the last 168 hours. BNP: Invalid input(s): POCBNP CBG: Recent Labs  Lab 03/06/20 1153 03/06/20 1657 03/06/20 2038 03/07/20 0740 03/07/20 1134  GLUCAP 272* 243* 248* 297* 315*   D-Dimer No results for input(s): DDIMER in the last 72 hours. Hgb A1c Recent Labs    03/05/20 2018  HGBA1C 8.0*   Lipid Profile No results for input(s): CHOL, HDL, LDLCALC, TRIG, CHOLHDL, LDLDIRECT in the last 72 hours. Thyroid function studies No results for input(s): TSH, T4TOTAL, T3FREE, THYROIDAB in the last 72 hours.  Invalid input(s): FREET3 Anemia work up No results for input(s): VITAMINB12, FOLATE, FERRITIN, TIBC, IRON, RETICCTPCT in the last 72 hours. Urinalysis    Component Value Date/Time   COLORURINE YELLOW 03/05/2020 1017   APPEARANCEUR HAZY (A) 03/05/2020 1017   LABSPEC 1.012 03/05/2020 1017   PHURINE 7.0 03/05/2020 1017   GLUCOSEU 50 (A) 03/05/2020 1017   HGBUR SMALL (A) 03/05/2020 1017   BILIRUBINUR NEGATIVE 03/05/2020 1017   KETONESUR NEGATIVE 03/05/2020 1017   PROTEINUR 30 (A) 03/05/2020 1017   UROBILINOGEN 0.2 06/17/2007 1435   NITRITE NEGATIVE 03/05/2020 1017   LEUKOCYTESUR LARGE (A) 03/05/2020 1017   Sepsis Labs Invalid input(s): PROCALCITONIN,  WBC,   LACTICIDVEN Microbiology Recent Results (from the past 240 hour(s))  SARS Coronavirus 2 by RT PCR (hospital order, performed in [facility] hospital lab) Nasopharyngeal Nasopharyngeal Swab     Status: None   Collection Time: 03/05/20  8:31 AM   Specimen: Nasopharyngeal Swab  Result Value Ref Range Status   SARS Coronavirus 2 NEGATIVE NEGATIVE Final    Comment: (NOTE) SARS-CoV-2 target nucleic acids are NOT DETECTED.  The SARS-CoV-2 RNA is generally detectable in upper and lower respiratory specimens during the acute phase of infection. The lowest concentration of SARS-CoV-2 viral copies this assay can detect is 250 copies / mL. A negative result does not preclude SARS-CoV-2 infection and should not be used as the sole basis for treatment or other patient management decisions.  A negative result may occur with improper specimen collection / handling, submission of specimen other than nasopharyngeal swab, presence of viral mutation(s) within the areas targeted by this assay, and inadequate number of viral copies (<250 copies / mL). A negative result must be combined with clinical observations, patient history, and epidemiological information.  Fact Sheet for Patients:   BoilerBrush.com.cy  Fact Sheet for Healthcare Providers: https://pope.com/  This test is not yet approved or  cleared by the [place] FDA and has been authorized for detection and/or diagnosis of SARS-CoV-2 by FDA under an Emergency Use Authorization (EUA).  This EUA will remain in effect (meaning this test can be used) for the duration of the COVID-19 declaration under Section 564(b)(1) of the Act, 21 U.S.C. section 360bbb-3(b)(1), unless the authorization is terminated or revoked sooner.  Performed at [facility], [address] [place], [place] [address]   MRSA PCR Screening  Status: Abnormal   Collection Time: 03/06/20  6:23 AM   Specimen: Nasal  Mucosa; Nasopharyngeal  Result Value Ref Range Status   MRSA by PCR POSITIVE (A) NEGATIVE Final    Comment:        The GeneXpert MRSA Assay (FDA approved for NASAL specimens only), is one component of a comprehensive MRSA colonization surveillance program. It is not intended to diagnose MRSA infection nor to guide or monitor treatment for MRSA infections. CRITICAL RESULT CALLED TO, READ BACK BY AND VERIFIED WITH: RN AMY K. AT 0800 03/06/20 BY MM. Performed at [facility], [address] [place], [place] [address]      Time coordinating discharge: Over 30 minutes  SIGNED:   Cipriano BunkerPARDEEP Graceann Boileau, MD  [facility] 03/07/2020, 12:09 PM Pager   If 7PM-7AM, please contact night-coverage [email or web address]

## 2020-03-07 NOTE — Progress Notes (Signed)
Orthopedic Tech Progress Note Patient Details:  [name] 07-16-1960 [id number] Called in brace Patient ID: [name], male   DOB: 29-Oct-1960, 59 y.o.   MRN: [id number]   Michelle Piper 03/07/2020, 8:11 AM

## 2020-03-07 NOTE — Progress Notes (Signed)
Inpatient Diabetes Program Recommendations  AACE/ADA: New Consensus Statement on Inpatient Glycemic Control (2015)  Target Ranges:  Prepandial:   less than 140 mg/dL      Peak postprandial:   less than 180 mg/dL (1-2 hours)      Critically ill patients:  140 - 180 mg/dL   Lab Results  Component Value Date   GLUCAP 297 (H) 03/07/2020   HGBA1C 8.0 (H) 03/05/2020    Review of Glycemic Control Results for [name], [name] (MRN [id number]) as of 03/07/2020 11:04  Ref. Range 03/06/2020 11:53 03/06/2020 16:57 03/06/2020 20:38 03/07/2020 07:40  Glucose-Capillary Latest Ref Range: 70 - 99 mg/dL 660 (H) 630 (H) 160 (H) 297 (H)   Diabetes history: Type 2 DM Outpatient Diabetes medications: Lantus 95 units QD, Humalog 12 units TID Current orders for Inpatient glycemic control: Lantus 75 units QHS, Novolog 0-15 units TID, Novolog 0-5 units QHS  Inpatient Diabetes Program Recommendations:    Consider increasing Lantus to 80 units QHS and adding Novolog 4 units TID (assuming patient is consuming >50% of meal).   Thanks, Lujean Rave, MSN, RNC-OB Diabetes Coordinator [phone] (8a-5p)

## 2020-03-08 LAB — TYPE AND SCREEN
ABO/RH(D): O NEG
Antibody Screen: NEGATIVE
Unit division: 0

## 2020-03-08 LAB — BPAM RBC
Blood Product Expiration Date: 202111182359
ISSUE DATE / TIME: 202111041241
Unit Type and Rh: 9500

## 2020-03-11 ENCOUNTER — Encounter (HOSPITAL_BASED_OUTPATIENT_CLINIC_OR_DEPARTMENT_OTHER): Payer: Medicare Other | Admitting: Internal Medicine

## 2020-03-31 ENCOUNTER — Other Ambulatory Visit: Payer: Self-pay

## 2020-03-31 ENCOUNTER — Encounter (HOSPITAL_COMMUNITY): Payer: Self-pay | Admitting: Emergency Medicine

## 2020-03-31 ENCOUNTER — Emergency Department (HOSPITAL_COMMUNITY)
Admission: EM | Admit: 2020-03-31 | Discharge: 2020-03-31 | Disposition: A | Discharge status: Home / Self Care | Payer: Medicare Other | Attending: Emergency Medicine | Admitting: Emergency Medicine

## 2020-03-31 DIAGNOSIS — Z79899 Other long term (current) drug therapy: Secondary | ICD-10-CM | POA: Diagnosis not present

## 2020-03-31 DIAGNOSIS — Z87891 Personal history of nicotine dependence: Secondary | ICD-10-CM | POA: Insufficient documentation

## 2020-03-31 DIAGNOSIS — I10 Essential (primary) hypertension: Secondary | ICD-10-CM | POA: Diagnosis not present

## 2020-03-31 DIAGNOSIS — Z7984 Long term (current) use of oral hypoglycemic drugs: Secondary | ICD-10-CM | POA: Diagnosis not present

## 2020-03-31 DIAGNOSIS — R339 Retention of urine, unspecified: Secondary | ICD-10-CM | POA: Diagnosis present

## 2020-03-31 DIAGNOSIS — R3982 Chronic bladder pain: Secondary | ICD-10-CM | POA: Diagnosis not present

## 2020-03-31 DIAGNOSIS — E1165 Type 2 diabetes mellitus with hyperglycemia: Secondary | ICD-10-CM | POA: Insufficient documentation

## 2020-03-31 DIAGNOSIS — Z794 Long term (current) use of insulin: Secondary | ICD-10-CM | POA: Insufficient documentation

## 2020-03-31 LAB — CBC
HCT: 32.8 % — ABNORMAL LOW (ref 39.0–52.0)
Hemoglobin: 10 g/dL — ABNORMAL LOW (ref 13.0–17.0)
MCH: 26.2 pg (ref 26.0–34.0)
MCHC: 30.5 g/dL (ref 30.0–36.0)
MCV: 85.9 fL (ref 80.0–100.0)
Platelets: 234 10*3/uL (ref 150–400)
RBC: 3.82 MIL/uL — ABNORMAL LOW (ref 4.22–5.81)
RDW: 17.5 % — ABNORMAL HIGH (ref 11.5–15.5)
WBC: 7.5 10*3/uL (ref 4.0–10.5)
nRBC: 0 % (ref 0.0–0.2)

## 2020-03-31 LAB — BASIC METABOLIC PANEL
Anion gap: 9 (ref 5–15)
BUN: 31 mg/dL — ABNORMAL HIGH (ref 6–20)
CO2: 24 mmol/L (ref 22–32)
Calcium: 9.3 mg/dL (ref 8.9–10.3)
Chloride: 100 mmol/L (ref 98–111)
Creatinine, Ser: 1.05 mg/dL (ref 0.61–1.24)
GFR, Estimated: >60 mL/min (ref >60)
Glucose, Bld: 183 mg/dL — ABNORMAL HIGH (ref 70–99)
Potassium: 4.2 mmol/L (ref 3.5–5.1)
Sodium: 133 mmol/L — ABNORMAL LOW (ref 135–145)

## 2020-03-31 NOTE — ED Notes (Signed)
Report given to [facility] [facility].  

## 2020-03-31 NOTE — ED Notes (Signed)
EMS at bedside

## 2020-03-31 NOTE — Discharge Instructions (Addendum)
We were unable to replace the suprapubic catheter.  Urology recommends that he be seen in the office this week to schedule replacement.  Keep Foley catheter in place until then.

## 2020-03-31 NOTE — ED Triage Notes (Signed)
Pt arrives [facility] from [facility] nursing home for urinary retention r/t suprapubic foley displacement. Per nursing staff they made multiple attempts to replace foley.

## 2020-03-31 NOTE — ED Provider Notes (Signed)
[facility] EMERGENCY DEPARTMENT Provider Note   CSN: [id number] Arrival date & time: 03/31/20  0043     History Chief Complaint  Patient presents with  . Urinary Retention    foley displaced    [name] is a 59 y.o. male.  Patient sent to the emergency department from [facility] nursing home to be evaluated for urinary retention.  Patient has a suprapubic catheter that is not functioning.  It is unclear how long the catheter has not been draining.  Nursing staff at the nursing home tried to replace it but were unsuccessful.  They also attempted to place a Foley catheter through his urethra and were unsuccessful.  Patient complaining of pain in the area of the bladder.        Past Medical History:  Diagnosis Date  . Anemia   . Chronic indwelling Foley catheter   . Depression   . Diabetes mellitus    Type II  . DVT (deep venous thrombosis) (HCC)   . GERD (gastroesophageal reflux disease)   . Hemiplegia (HCC)   . Hyperlipemia   . Hypertension   . Neurogenic bladder   . OCD (obsessive compulsive disorder)   . Pneumonia   . Seizures (HCC)    since a head injury in high school- no recent sezizures  . Stroke [facility])    deficit in right arm & right leg    Patient Active Problem List   Diagnosis Date Noted  . Goals of care, counseling/discussion   . Palliative care by specialist   . DNR (do not resuscitate)   . 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 Type 2 diabetes mellitus with hyperglycemia, with long-term current use of insulin (HCC) 11/01/2012  . HTN (hypertension) 11/01/2012  . Dyslipidemia 11/01/2012  . Convulsions/seizures (HCC) 11/01/2012  . History of CVA with residual deficit 11/01/2012  . Depressive disorder, not elsewhere classified 11/01/2012  . DVT (deep venous thrombosis) (HCC) 11/01/2012    Past Surgical History:  Procedure Laterality Date  . AMPUTATION Left 03/05/2020   Procedure: LEFT ABOVE KNEE AMPUTATION;  Surgeon: Larina Earthly, MD;   Location[facility] OR;  Service: Vascular;  Laterality: Left;  . EYE SURGERY     cataract removal left eye  . INSERTION OF SUPRAPUBIC CATHETER N/A 06/23/2017   Procedure: INSERTION OF SUPRAPUBIC CATHETER;  Surgeon: Malen Gauze, MD;  Location: [facility] ORS;  Service: Urology;  Laterality: N/A;  . MULTIPLE EXTRACTIONS WITH ALVEOLOPLASTY N/A 05/30/2013   Procedure: MULTIPLE EXTRACTION Teeth number Six, Eight, Nine, Ten, Eleven, Thirteen, Nineteen, and Twenty Eight  WITH ALVEOLOPLASTY;  Surgeon: Georgia Lopes, DDS;  Location: [facility] OR;  Service: Oral Surgery;  Laterality: N/A;  . MULTIPLE TOOTH EXTRACTIONS    . SUPRAPUBIC CATHETER INSERTION  2009  . TOOTH EXTRACTION N/A 10/16/2016   Procedure: DENTAL RESTORATION/EXTRACTIONS NUMBER FIVE, FOURTEEN, TWENTY-TWO, TWENTY- THREE, TWENTY-FOUR, TWENTY-FIVE, TWENTY-SIX, TWENTY-SEVEN, THIRTY-ONE, AND ALVEOLOPLASTY.;  Surgeon: Ocie Doyne, DDS;  Location: [facility] OR;  Service: Oral Surgery;  Laterality: N/A;  . TRACHEOSTOMY  2009   also removal of        History reviewed. No pertinent family history.  Social History   Tobacco Use  . Smoking status: Former Games developer  . Smokeless tobacco: Never Used  Substance Use Topics  . Alcohol use: No  . Drug use: No    Home Medications Prior to Admission medications   Medication Sig Start Date End Date Taking? Authorizing Provider  acetaminophen (TYLENOL) 325 MG tablet Take 650 mg  by mouth in the morning.    [provider]  acetaminophen (TYLENOL) 500 MG tablet Take 500 mg by mouth every 8 (eight) hours as needed (for breakthrough pain).     [provider]  baclofen (LIORESAL) 10 MG tablet Take 15 mg by mouth 3 (three) times daily.      [provider]  brimonidine (ALPHAGAN P) 0.1 % SOLN Place 1 drop into both eyes 2 (two) times daily.    [provider]  Calcium Carbonate (CALCARB 600 PO) Take 1 tablet by mouth daily with breakfast.    [provider]  CALMOSEPTINE 0.44-20.6  % OINT Apply 1 application topically See admin instructions. Apply to buttocks and gluteal folds every eight hours as needed for protection 06/01/19   [provider]  Cholecalciferol (VITAMIN D3) 50 MCG (2000 UT) TABS Take 2,000 Units by mouth in the morning.    [provider]  clonazePAM (KLONOPIN) 0.5 MG tablet Take 0.5 mg by mouth 2 (two) times daily.  09/05/19   [provider]  Emollient (CETAPHIL MOISTURIZING EX) Apply 1 application topically See admin instructions. Apply to feet and legs every 12 hours as needed for hydration of the skin    [provider]  escitalopram (LEXAPRO) 20 MG tablet Take 20 mg by mouth every morning.    [provider]  famotidine (PEPCID) 20 MG tablet Take 20 mg by mouth 2 (two) times daily before a meal.     [provider]  ferrous sulfate 325 (65 FE) MG tablet Take 325 mg by mouth 3 (three) times daily with meals.     [provider]  folic acid (FOLVITE) 1 MG tablet Take 1 mg by mouth daily. 06/01/19   [provider]  HYPROMELLOSE OP Place 1 drop into both eyes 2 (two) times daily.    [provider]  insulin glargine (LANTUS) 100 UNIT/ML injection Inject 75 Units into the skin at bedtime.     [provider]  insulin lispro (HUMALOG) 100 UNIT/ML injection Inject 15 Units into the skin See admin instructions. Inject 15 units into the skin three times a day with meals and an additional 2-10 units FOUR TIMES A DAY, per sliding scale: BGL 150-199 = 2 units; 200-249 = 4 units; 250-299 = 6 units; 300-399 = 8 units; and 400-450 = 10 units    [provider]  KEPPRA 500 MG tablet Take 500 mg by mouth See admin instructions. Take 500 mg by mouth at 8 AM, 2 PM, and 8 PM    [provider]  lisinopril (PRINIVIL,ZESTRIL) 2.5 MG tablet Take 2.5 mg by mouth every morning.    [provider]  metFORMIN (GLUCOPHAGE) 1000 MG tablet Take 1,000 mg by mouth 2 (two)  times daily.     [provider]  MILK OF MAGNESIA 400 MG/5ML suspension Take 30 mLs by mouth at bedtime as needed (for constipation/no B.M. in 3 days).  06/12/19   [provider]  Multiple Vitamins-Minerals (CERTAGEN) tablet Take 1 tablet by mouth daily.    [provider]  MYRBETRIQ 50 MG TB24 tablet Take 50 mg by mouth daily. 08/28/19   [provider]  oxyCODONE-acetaminophen (PERCOCET/ROXICET) 5-325 MG tablet Take 1-2 tablets by mouth every 4 (four) hours as needed for moderate pain. 03/07/20   Cipriano Bunker, MD  pregabalin (LYRICA) 100 MG capsule Take 100 mg by mouth 3 (three) times daily.     [provider]  rosuvastatin (  CRESTOR) 20 MG tablet Take 20 mg by mouth every evening.     [provider]  senna-docusate (SENOKOT-S) 8.6-50 MG tablet Take 1 tablet by mouth at bedtime.     [provider]  vitamin C (ASCORBIC ACID) 500 MG tablet Take 500 mg by mouth in the morning.     [provider]    Allergies    Patient has no known allergies.  Review of Systems   Review of Systems  Genitourinary: Positive for decreased urine volume.  All other systems reviewed and are negative.   Physical Exam Updated Vital Signs BP 126/64   Pulse 90   Temp 98.3 F (36.8 C) (Oral)   Resp 18   Ht 6\' 1"  (1.854 m)   Wt 95.7 kg   SpO2 95%   BMI 27.84 kg/m   Physical Exam Vitals and nursing note reviewed.  Constitutional:      General: He is not in acute distress.    Appearance: Normal appearance. He is well-developed.  HENT:     Head: Normocephalic and atraumatic.     Right Ear: Hearing normal.     Left Ear: Hearing normal.     Nose: Nose normal.  Eyes:     Conjunctiva/sclera: Conjunctivae normal.     Pupils: Pupils are equal, round, and reactive to light.  Cardiovascular:     Rate and Rhythm: Regular rhythm.     Heart sounds: S1 normal and S2 normal. No murmur heard.  No friction rub. No gallop.   Pulmonary:      Effort: Pulmonary effort is normal. No respiratory distress.     Breath sounds: Normal breath sounds.  Chest:     Chest wall: No tenderness.  Abdominal:     General: Bowel sounds are normal.     Palpations: Abdomen is soft.     Tenderness: There is abdominal tenderness in the suprapubic area. There is no guarding or rebound. Negative signs include Murphy's sign and McBurney's sign.     Hernia: No hernia is present.     Comments: Pressing on the bladder causes urine to express around the suprapubic catheter  Musculoskeletal:        General: Normal range of motion.     Cervical back: Normal range of motion and neck supple.  Skin:    General: Skin is warm and dry.     Findings: No rash.  Neurological:     Mental Status: He is alert and oriented to person, place, and time.     GCS: GCS eye subscore is 4. GCS verbal subscore is 5. GCS motor subscore is 6.     Cranial Nerves: No cranial nerve deficit.     Sensory: No sensory deficit.     Coordination: Coordination normal.  Psychiatric:        Speech: Speech normal.        Behavior: Behavior normal.        Thought Content: Thought content normal.     ED Results / Procedures / Treatments   Labs (all labs ordered are listed, but only abnormal results are displayed) Labs Reviewed  CBC - Abnormal; Notable for the following components:      Result Value   RBC 3.82 (*)    Hemoglobin 10.0 (*)    HCT 32.8 (*)    RDW 17.5 (*)    All other components within normal limits  BASIC METABOLIC PANEL - Abnormal; Notable for the following components:   Sodium 133 (*)  Glucose, Bld 183 (*)    BUN 31 (*)    All other components within normal limits    EKG None  Radiology No results found.  Procedures Procedures (including critical care time)  Medications Ordered in ED Medications - No data to display  ED Course  I have reviewed the triage vital signs and the nursing notes.  Pertinent labs & imaging results that were available  during my care of the patient were reviewed by me and considered in my medical decision making (see chart for details).    MDM Rules/Calculators/A&P                          Patient presents with no urine outflow through his suprapubic catheter.  There is a catheter placed in the tract but it is not draining.  This catheter was removed.  Using sterile technique I attempted to place a new Foley catheter into the bladder.  I was able to advance it through the soft tissues of the abdomen but it would not go into the bladder.  Multiple different catheters were attempted including a coud.  I suspect that the current catheter has been out of the bladder but in the abdominal wall for an unknown period of time and that tract has closed at the level of the bladder.  A Foley catheter was placed by nursing staff through the urethra.  It is draining freely.  Lab work is unremarkable.  Discussed with Dr. Liliane ShiWinter, on-call for urology.  Patient does not require urgent follow-up as he has a catheter in place.  Patient can be seen in the office this week to arrange for a new suprapubic catheter to be placed.  Final Clinical Impression(s) / ED Diagnoses Final diagnoses:  Urinary retention    Rx / DC Orders ED Discharge Orders    None       Cody Albus, Canary Brimhristopher J, MD 03/31/20 [phone]

## 2020-04-01 ENCOUNTER — Encounter: Payer: Self-pay | Admitting: Vascular Surgery

## 2020-04-01 ENCOUNTER — Ambulatory Visit (INDEPENDENT_AMBULATORY_CARE_PROVIDER_SITE_OTHER): Payer: Self-pay | Admitting: Vascular Surgery

## 2020-04-01 VITALS — BP 117/72 | HR 103 | Temp 97.5°F | Resp 12

## 2020-04-01 DIAGNOSIS — Z89612 Acquired absence of left leg above knee: Secondary | ICD-10-CM

## 2020-04-01 NOTE — Progress Notes (Signed)
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 male here today for follow-up.  He has had no difficulty following amputation and was discharged back to his nursing facility.  Current Outpatient Medications  Medication Sig Dispense Refill   acetaminophen (TYLENOL) 325 MG tablet Take 650 mg by mouth in the morning.     acetaminophen (TYLENOL) 500 MG tablet Take 500 mg by mouth every 8 (eight) hours as needed (for breakthrough pain).      baclofen (LIORESAL) 10 MG tablet Take 15 mg by mouth 3 (three) times daily.       brimonidine (ALPHAGAN P) 0.1 % SOLN Place 1 drop into both eyes 2 (two) times daily.     Calcium Carbonate (CALCARB 600 PO) Take 1 tablet by mouth daily with breakfast.     CALMOSEPTINE 0.44-20.6 % OINT Apply 1 application topically See admin instructions. Apply to buttocks and gluteal folds every eight hours as needed for protection     Cholecalciferol (VITAMIN D3) 50 MCG (2000 UT) TABS Take 2,000 Units by mouth in the morning.     clonazePAM (KLONOPIN) 0.5 MG tablet Take 0.5 mg by mouth 2 (two) times daily.      Emollient (CETAPHIL MOISTURIZING EX) Apply 1 application topically See admin instructions. Apply to feet and legs every 12 hours as needed for hydration of the skin     escitalopram (LEXAPRO) 20 MG tablet Take 20 mg by mouth every morning.     famotidine (PEPCID) 20 MG tablet Take 20 mg by mouth 2 (two) times daily before a meal.      ferrous sulfate 325 (65 FE) MG tablet Take 325 mg by mouth 3 (three) times daily with meals.      folic acid (FOLVITE) 1 MG tablet Take 1 mg by mouth daily.     HYPROMELLOSE OP Place 1 drop into both eyes 2 (two) times daily.     insulin glargine (LANTUS) 100 UNIT/ML injection Inject 75 Units into  the skin at bedtime.      insulin lispro (HUMALOG) 100 UNIT/ML injection Inject 15 Units into the skin See admin instructions. Inject 15 units into the skin three times a day with meals and an additional 2-10 units FOUR TIMES A DAY, per sliding scale: BGL 150-199 = 2 units; 200-249 = 4 units; 250-299 = 6 units; 300-399 = 8 units; and 400-450 = 10 units     KEPPRA 500 MG tablet Take 500 mg by mouth See admin instructions. Take 500 mg by mouth at 8 AM, 2 PM, and 8 PM     lisinopril (PRINIVIL,ZESTRIL) 2.5 MG tablet Take 2.5 mg by mouth every morning.     metFORMIN (GLUCOPHAGE) 1000 MG tablet Take 1,000 mg by mouth 2 (two) times daily.      MILK OF MAGNESIA 400 MG/5ML suspension Take 30 mLs by mouth at bedtime as needed (for constipation/no B.M. in 3 days).      Multiple Vitamins-Minerals (CERTAGEN) tablet Take 1 tablet by mouth daily.     MYRBETRIQ 50 MG TB24 tablet Take 50 mg by mouth daily.     oxyCODONE-acetaminophen (PERCOCET/ROXICET) 5-325 MG tablet Take 1-2 tablets by mouth every 4 (four) hours as needed for moderate pain. 10 tablet 0   pregabalin (LYRICA) 100 MG capsule Take 100 mg by mouth  3 (three) times daily.      rosuvastatin (CRESTOR) 20 MG tablet Take 20 mg by mouth every evening.      senna-docusate (SENOKOT-S) 8.6-50 MG tablet Take 1 tablet by mouth at bedtime.      vitamin C (ASCORBIC ACID) 500 MG tablet Take 500 mg by mouth in the morning.      No current facility-administered medications for this visit.     PHYSICAL EXAM: Vitals:   04/01/20 1442  BP: 117/72  Pulse: (!) 103  Resp: 12  Temp: (!) 97.5 F (36.4 C)  TempSrc: Other (Comment)  SpO2: 96%    GENERAL: The patient is a well-nourished male, in no acute distress. The vital signs are documented above. Left above-knee amputation completely healed with no evidence of skin disruption.  MEDICAL ISSUES: Staples removed today.  Will see [place] as needed   Larina Earthly, MD [facility] Tel [phone]

## 2020-04-03 ENCOUNTER — Other Ambulatory Visit: Payer: Self-pay

## 2020-04-03 ENCOUNTER — Telehealth: Payer: Self-pay

## 2020-04-03 DIAGNOSIS — N319 Neuromuscular dysfunction of bladder, unspecified: Secondary | ICD-10-CM

## 2020-04-03 NOTE — Telephone Encounter (Signed)
Received message from [place] from [facility] pt suprapubic catheter had fallen out. Pt was seen at the ER and a foley catheter was replaced. Called to scheduled appt to see MD.  Philomena Course call at [phone]  No answer. No way to leave message.

## 2020-04-04 NOTE — Addendum Note (Signed)
Addended by: Ferdinand Lango on: 04/04/2020 09:25 AM   Modules accepted: Orders

## 2020-04-10 ENCOUNTER — Telehealth: Payer: Self-pay

## 2020-04-10 NOTE — Telephone Encounter (Signed)
Returned call from [place] to [employer]. She was checking on scheduling IR cath placement for pt. Talked with [place] Ana and she is checking with lady that schedules them. Ms [name] notified.

## 2020-04-11 ENCOUNTER — Telehealth: Payer: Self-pay

## 2020-04-15 ENCOUNTER — Encounter (HOSPITAL_COMMUNITY): Payer: Self-pay

## 2020-04-15 NOTE — Progress Notes (Unsigned)
[facility]     1       [name] Male, 59 y.o., July 01, 1960  MRN:  [id number] Phone:  [phone] (H)       PCP:  Pcp, No Primary Cvg:  [employer] Received: 4 days ago  Message Details  Oley Balm, MD  Laney Potash   CT suprapubic catheter   DDH    Previous Messages  ----- Message -----  From: Cory Munch  Sent: 04/11/2020 11:15 AM EST  To: Ir Procedure Requests    ----- Message -----  From: Gustavus Messing, LPN  Sent: 97/0/2637 11:04 AM EST  To: Cory Munch    ----- Message -----  From: Malen Gauze, MD  Sent: 04/11/2020 10:38 AM EST  To: Gustavus Messing, LPN   His SP tube is out and he currently has an indwelling foley  ----- Message -----  From: Gustavus Messing, LPN  Sent: 85/12/8500 10:37 AM EST  To: Malen Gauze, MD   Just received a call regarding message below. Please advise.  ----- Message -----  From: Cory Munch  Sent: 04/11/2020 10:33 AM EST  To: Gustavus Messing, LPN    ----- Message -----  From: Richarda Overlie, MD  Sent: 04/10/2020  4:31 PM EST  To: Cory Munch   We need clarification for this order. Sounds like patient already has a suprapubic catheter. What do they want [place] to do exactly? Exchange the catheter?   [place]enn  ----- Message -----  From: Cory Munch  Sent: 04/10/2020  3:37 PM EST  To: Ir Procedure Requests   Procedure Requested: CT IMAGE GUIDED FLUID DRAIN BY CATHETER    Reason for Procedure: neurogenic bladder    Provider Requesting: Malen Gauze  Provider Telephone: [phone]    Other Info:

## 2020-04-18 ENCOUNTER — Other Ambulatory Visit: Payer: Self-pay | Admitting: Radiology

## 2020-04-22 ENCOUNTER — Ambulatory Visit (HOSPITAL_COMMUNITY): Admission: RE | Admit: 2020-04-22 | Payer: Medicare Other | Source: Ambulatory Visit

## 2020-04-23 ENCOUNTER — Other Ambulatory Visit: Payer: Self-pay | Admitting: Physician Assistant

## 2020-04-24 ENCOUNTER — Encounter (HOSPITAL_COMMUNITY): Payer: Self-pay

## 2020-04-24 ENCOUNTER — Other Ambulatory Visit: Payer: Self-pay

## 2020-04-24 ENCOUNTER — Ambulatory Visit (HOSPITAL_COMMUNITY)
Admission: RE | Admit: 2020-04-24 | Discharge: 2020-04-24 | Disposition: A | Discharge status: Home / Self Care | Payer: Medicare Other | Source: Ambulatory Visit | Attending: Urology | Admitting: Urology

## 2020-04-24 DIAGNOSIS — Z87891 Personal history of nicotine dependence: Secondary | ICD-10-CM | POA: Insufficient documentation

## 2020-04-24 DIAGNOSIS — G8220 Paraplegia, unspecified: Secondary | ICD-10-CM | POA: Diagnosis not present

## 2020-04-24 DIAGNOSIS — Z79899 Other long term (current) drug therapy: Secondary | ICD-10-CM | POA: Insufficient documentation

## 2020-04-24 DIAGNOSIS — N319 Neuromuscular dysfunction of bladder, unspecified: Secondary | ICD-10-CM | POA: Diagnosis present

## 2020-04-24 DIAGNOSIS — Z8673 Personal history of transient ischemic attack (TIA), and cerebral infarction without residual deficits: Secondary | ICD-10-CM | POA: Diagnosis not present

## 2020-04-24 DIAGNOSIS — Z794 Long term (current) use of insulin: Secondary | ICD-10-CM | POA: Diagnosis not present

## 2020-04-24 LAB — GLUCOSE, CAPILLARY
Glucose-Capillary: 53 mg/dL — ABNORMAL LOW (ref 70–99)
Glucose-Capillary: 99 mg/dL (ref 70–99)
Glucose-Capillary: 70 mg/dL (ref 70–99)

## 2020-04-24 MED ORDER — MIDAZOLAM HCL 2 MG/2ML IJ SOLN
INTRAMUSCULAR | Status: AC
  Filled 2020-04-24: qty 2

## 2020-04-24 MED ORDER — SODIUM CHLORIDE 0.9 % IV SOLN
INTRAVENOUS | Status: AC | PRN
  Administered 2020-04-24: 250 mL via INTRAVENOUS

## 2020-04-24 MED ORDER — DEXTROSE 50 % IV SOLN
25.0000 mL | Freq: Once | INTRAVENOUS | Status: AC
  Administered 2020-04-24: 25 mL via INTRAVENOUS

## 2020-04-24 MED ORDER — MIDAZOLAM HCL 2 MG/2ML IJ SOLN
INTRAMUSCULAR | Status: AC | PRN
  Administered 2020-04-24: 1 mg via INTRAVENOUS
  Administered 2020-04-24: 0.5 mg via INTRAVENOUS

## 2020-04-24 MED ORDER — DEXTROSE 50 % IV SOLN
INTRAVENOUS | Status: AC
  Filled 2020-04-24: qty 50

## 2020-04-24 MED ORDER — FENTANYL CITRATE (PF) 100 MCG/2ML IJ SOLN
INTRAMUSCULAR | Status: AC | PRN
  Administered 2020-04-24 (×2): 50 ug via INTRAVENOUS

## 2020-04-24 MED ORDER — SODIUM CHLORIDE 0.9 % IV SOLN: INTRAVENOUS | Status: DC

## 2020-04-24 MED ORDER — FENTANYL CITRATE (PF) 100 MCG/2ML IJ SOLN
INTRAMUSCULAR | Status: AC
  Filled 2020-04-24: qty 2

## 2020-04-24 NOTE — Progress Notes (Signed)
IR.  Urinary foley catheter removed intact bedside, no complications.  Please call IR with questions/concerns.   Waylan Boga Tonda Wiederhold, PA-C 04/24/2020, 1:15 PM

## 2020-04-24 NOTE — Procedures (Signed)
Pre procedural Dx: Suprapubic catheter placement Post procedural Dx: Same  Technically successful CT guided replacement of 14 Fr suprapubic catheter via existing track.  EBL: None Complications: None immediate  [name], MD Pager #: [phone]

## 2020-04-24 NOTE — Discharge Instructions (Signed)
Suprapubic Catheter Home Guide °A suprapubic catheter is a flexible tube that is used to drain urine from the bladder into a collection bag outside the body. The catheter is inserted into the bladder through a small opening in the lower abdomen, above the pubic bone (suprapubic area) and a few inches below your belly button (navel). A tiny balloon filled with germ-free (sterile) water helps to keep the catheter in place. °The collection bag must be emptied at least once a day and cleaned at least every other day. The collection bag can be put beside your bed at night and attached to your leg during the day. You may have a large collection bag to use at night and a smaller one to use during the day. °Your suprapubic catheter may need to be changed every 4-6 weeks, or as often as recommended by your health care provider. Healing of the tract where the catheter is placed can take 6 weeks to 6 months. During that time, your health care provider may change your catheter. Once the tract is well healed, you or a caregiver will change your suprapubic catheter at home. °What are the risks? °This catheter is safe to use. However, problems can occur, including: °· Blocked urine flow. This can occur if the catheter stops working, or if you have a blood clot in your bladder or in the catheter. °· Irritation of the skin around the catheter. °· Infection. This can happen if bacteria gets into your bladder. °Supplies needed: °· Two pairs of sterile gloves. °· Paper towels. °· Catheter. °· Two syringes. °· Sterile water. °· Sterile cleaning solution. °· Lubricant. °· Collection bags. °How to change the catheter ° °1. Drink plenty of fluids during the hours before you change the catheter. °2. Wash your hands with soap and water. If soap and water are not available, use hand sanitizer. °3. Draw up sterile water into a syringe to have ready to fill the new catheter balloon. The amount will depend on the size of the balloon. °4. Have  all of your supplies ready and close to you on a paper towel. °5. Lie on your back, sitting slightly upright so that you can see the catheter and opening. °6. Put on sterile gloves. °7. Clean the skin around the catheter opening using the sterile cleaning solution. °8. Remove the water from the balloon in the catheter using a syringe. °9. Slowly remove the catheter. If the catheter seems stuck, or if you have difficulty removing it: °? Do not pull on it. °? Call your health care provider right away. °10. Place the old catheter on a paper towel to discard later. °11. Take off the used gloves, and put on a new pair. °12. Put lubricant on the end of the new catheter that will go into your bladder. °13. Clean the skin around the catheter opening using the sterile cleaning solution. °14. Gently slide the catheter through the opening in your abdomen and into the tract that leads to your bladder. °15. Wait for some urine to start flowing through the catheter. °16. When urine starts to flow through the catheter, attach the collection bag to the end of the catheter. Make sure the connection is tight. °17. Use a syringe to fill the catheter balloon with sterile water. Fill to the amount directed by your health care provider. °18. Remove the gloves and wash your hands with soap and water. °How to care for the skin around the catheter °Follow your health care provider's instructions on   caring for your skin. °· Use a clean washcloth and soapy water to clean the skin around your catheter every day. Pat the area dry with a clean paper towel. °· Do not pull on the catheter. °· Do not use ointment or lotion on this area, unless told by your health care provider. °· Check the skin around the catheter every day for signs of infection. Check for: °? Redness, swelling, or pain. °? Fluid or blood. °? Warmth. °? Pus or a bad smell. °How to empty and clean the collection bag °Empty the large collection bag every 8 hours. Empty the small  collection bag when it is about ? full. Clean the collection bag every 2-3 days, or as often as told by your health care provider. To do this: °1. Wash your hands with soap and water. If soap and water are not available, use hand sanitizer. °2. Disconnect the bag from the catheter and immediately attach a new bag to the catheter. °3. Hold the used bag over the toilet or another container. °4. Turn the valve (spigot) at the bottom of the bag to empty the urine. Empty the used bag completely. °? Do not touch the opening of the spigot. °? Do not let the opening touch the toilet or container. °5. Close the spigot tightly when the bag is empty. °6. Clean the used bag in one of the following methods: °? According to the manufacturer's instructions. °? As told by your health care provider. °7. Let the bag dry completely. Put it in a clean plastic bag before storing it. °General tips ° °· Always wash your hands before and after caring for your catheter and collection bag. Use a mild, fragrance-free soap. If soap and water are not available, use hand sanitizer. °· Clean the outside of the catheter with soap and water as often as told by your health care provider. °· Always make sure there are no twists or kinks in the catheter tube. °· Always make sure there are no leaks in the catheter or collection bag. °· Always wear the leg bag below your knee. °· Make sure the overnight drainage bag is always lower than the level of your bladder, but do not let it touch the floor. Before you go to sleep, hang the bag inside a wastebasket that is covered by a clean plastic bag. °· Drink enough fluid to keep your urine pale yellow. °· Do not take baths, swim, or use a hot tub until your health care provider approves. Ask your health care provider if you may take showers. °Contact a heath care provider if: °· You leak urine. °· You have redness, swelling, or pain around your catheter. °· You have fluid or blood coming from your catheter  opening. °· Your catheter opening feels warm to the touch. °· You have pus or a bad smell coming from your catheter opening. °· You have a fever or chills. °· Your urine flow slows down. °· Your urine becomes cloudy or smelly. °Get help right away if: °· Your catheter comes out. °· You have: °? Nausea. °? Back pain. °? Difficulty changing your catheter. °? Blood in your urine. °? No urine flow for 1 hour. °Summary °· A suprapubic catheter is a flexible tube that is used to drain urine from the bladder into a collection bag outside the body. °· Your suprapubic catheter may need to be changed every 4-6 weeks, or as recommended by your health care provider. °· Follow instructions on how to   change the catheter and how to empty and clean the collection bag. °· Always wash your hands before and after caring for your catheter and collection bag. Drink enough fluid to keep your urine pale yellow. °· Get help right away if you have difficulty changing your catheter or if there is blood in your urine. °This information is not intended to replace advice given to you by your health care provider. Make sure you discuss any questions you have with your health care provider. °Document Revised: 08/11/2018 Document Reviewed: 05/25/2018 °Elsevier Patient Education © 2020 [employer] ° °

## 2020-04-24 NOTE — Discharge Instructions (Addendum)
Suprapubic Catheter Home Guide A suprapubic catheter is a flexible tube that is used to drain urine from the bladder into a collection bag outside the body. The catheter is inserted into the bladder through a small opening in the lower abdomen, above the pubic bone (suprapubic area) and a few inches below your belly button (navel). A tiny balloon filled with germ-free (sterile) water helps to keep the catheter in place. The collection bag must be emptied at least once a day and cleaned at least every other day. The collection bag can be put beside your bed at night and attached to your leg during the day. You may have a large collection bag to use at night and a smaller one to use during the day. Your suprapubic catheter may need to be changed every 4-6 weeks, or as often as recommended by your health care provider. Healing of the tract where the catheter is placed can take 6 weeks to 6 months. During that time, your health care provider may change your catheter. Once the tract is well healed, you or a caregiver will change your suprapubic catheter at home. What are the risks? This catheter is safe to use. However, problems can occur, including:  Blocked urine flow. This can occur if the catheter stops working, or if you have a blood clot in your bladder or in the catheter.  Irritation of the skin around the catheter.  Infection. This can happen if bacteria gets into your bladder. Supplies needed:  Two pairs of sterile gloves.  Paper towels.  Catheter.  Two syringes.  Sterile water.  Sterile cleaning solution.  Lubricant.  Collection bags. How to change the catheter  1. Drink plenty of fluids during the hours before you change the catheter. 2. Wash your hands with soap and water. If soap and water are not available, use hand sanitizer. 3. Draw up sterile water into a syringe to have ready to fill the new catheter balloon. The amount will depend on the size of the balloon. 4. Have  all of your supplies ready and close to you on a paper towel. 5. Lie on your back, sitting slightly upright so that you can see the catheter and opening. 6. Put on sterile gloves. 7. Clean the skin around the catheter opening using the sterile cleaning solution. 8. Remove the water from the balloon in the catheter using a syringe. 9. Slowly remove the catheter. If the catheter seems stuck, or if you have difficulty removing it: ? Do not pull on it. ? Call your health care provider right away. 10. Place the old catheter on a paper towel to discard later. 11. Take off the used gloves, and put on a new pair. 12. Put lubricant on the end of the new catheter that will go into your bladder. 13. Clean the skin around the catheter opening using the sterile cleaning solution. 14. Gently slide the catheter through the opening in your abdomen and into the tract that leads to your bladder. 15. Wait for some urine to start flowing through the catheter. 16. When urine starts to flow through the catheter, attach the collection bag to the end of the catheter. Make sure the connection is tight. 17. Use a syringe to fill the catheter balloon with sterile water. Fill to the amount directed by your health care provider. 18. Remove the gloves and wash your hands with soap and water. How to care for the skin around the catheter Follow your health care provider's instructions on   caring for your skin.  Use a clean washcloth and soapy water to clean the skin around your catheter every day. Pat the area dry with a clean paper towel.  Do not pull on the catheter.  Do not use ointment or lotion on this area, unless told by your health care provider.  Check the skin around the catheter every day for signs of infection. Check for: ? Redness, swelling, or pain. ? Fluid or blood. ? Warmth. ? Pus or a bad smell. How to empty and clean the collection bag Empty the large collection bag every 8 hours. Empty the small  collection bag when it is about ? full. Clean the collection bag every 2-3 days, or as often as told by your health care provider. To do this: 1. Wash your hands with soap and water. If soap and water are not available, use hand sanitizer. 2. Disconnect the bag from the catheter and immediately attach a new bag to the catheter. 3. Hold the used bag over the toilet or another container. 4. Turn the valve (spigot) at the bottom of the bag to empty the urine. Empty the used bag completely. ? Do not touch the opening of the spigot. ? Do not let the opening touch the toilet or container. 5. Close the spigot tightly when the bag is empty. 6. Clean the used bag in one of the following methods: ? According to the manufacturer's instructions. ? As told by your health care provider. 7. Let the bag dry completely. Put it in a clean plastic bag before storing it. General tips   Always wash your hands before and after caring for your catheter and collection bag. Use a mild, fragrance-free soap. If soap and water are not available, use hand sanitizer.  Clean the outside of the catheter with soap and water as often as told by your health care provider.  Always make sure there are no twists or kinks in the catheter tube.  Always make sure there are no leaks in the catheter or collection bag.  Always wear the leg bag below your knee.  Make sure the overnight drainage bag is always lower than the level of your bladder, but do not let it touch the floor. Before you go to sleep, hang the bag inside a wastebasket that is covered by a clean plastic bag.  Drink enough fluid to keep your urine pale yellow.  Do not take baths, swim, or use a hot tub until your health care provider approves. Ask your health care provider if you may take showers. Contact a heath care provider if:  You leak urine.  You have redness, swelling, or pain around your catheter.  You have fluid or blood coming from your catheter  opening.  Your catheter opening feels warm to the touch.  You have pus or a bad smell coming from your catheter opening.  You have a fever or chills.  Your urine flow slows down.  Your urine becomes cloudy or smelly. Get help right away if:  Your catheter comes out.  You have: ? Nausea. ? Back pain. ? Difficulty changing your catheter. ? Blood in your urine. ? No urine flow for 1 hour. Summary  A suprapubic catheter is a flexible tube that is used to drain urine from the bladder into a collection bag outside the body.  Your suprapubic catheter may need to be changed every 4-6 weeks, or as recommended by your health care provider.  Follow instructions on how to   change the catheter and how to empty and clean the collection bag.  Always wash your hands before and after caring for your catheter and collection bag. Drink enough fluid to keep your urine pale yellow.  Get help right away if you have difficulty changing your catheter or if there is blood in your urine. This information is not intended to replace advice given to you by your health care provider. Make sure you discuss any questions you have with your health care provider. Document Revised: 08/11/2018 Document Reviewed: 05/25/2018 Elsevier Patient Education  2020 [employer] Moderate Conscious Sedation, Adult Sedation is the use of medicines to promote relaxation and relieve discomfort and anxiety. Moderate conscious sedation is a type of sedation. Under moderate conscious sedation, you are less alert than normal, but you are still able to respond to instructions, touch, or both. Moderate conscious sedation is used during short medical and dental procedures. It is milder than deep sedation, which is a type of sedation under which you cannot be easily woken up. It is also milder than general anesthesia, which is the use of medicines to make you unconscious. Moderate conscious sedation allows you to return to your regular  activities sooner. Tell a health care provider about:  Any allergies you have.  All medicines you are taking, including vitamins, herbs, eye drops, creams, and over-the-counter medicines.  Use of steroids (by mouth or creams).  Any problems you or family members have had with sedatives and anesthetic medicines.  Any blood disorders you have.  Any surgeries you have had.  Any medical conditions you have, such as sleep apnea.  Whether you are pregnant or may be pregnant.  Any use of cigarettes, alcohol, marijuana, or street drugs. What are the risks? Generally, this is a safe procedure. However, problems may occur, including:  Getting too much medicine (oversedation).  Nausea.  Allergic reaction to medicines.  Trouble breathing. If this happens, a breathing tube may be used to help with breathing. It will be removed when you are awake and breathing on your own.  Heart trouble.  Lung trouble. What happens before the procedure? Staying hydrated Follow instructions from your health care provider about hydration, which may include:  Up to 2 hours before the procedure - you may continue to drink clear liquids, such as water, clear fruit juice, black coffee, and plain tea. Eating and drinking restrictions Follow instructions from your health care provider about eating and drinking, which may include:  8 hours before the procedure - stop eating heavy meals or foods such as meat, fried foods, or fatty foods.  6 hours before the procedure - stop eating light meals or foods, such as toast or cereal.  6 hours before the procedure - stop drinking milk or drinks that contain milk.  2 hours before the procedure - stop drinking clear liquids. Medicine Ask your health care provider about:  Changing or stopping your regular medicines. This is especially important if you are taking diabetes medicines or blood thinners.  Taking medicines such as aspirin and ibuprofen. These medicines  can thin your blood. Do not take these medicines before your procedure if your health care provider instructs you not to.  Tests and exams  You will have a physical exam.  You may have blood tests done to show: ? How well your kidneys and liver are working. ? How well your blood can clot. General instructions  Plan to have someone take you home from the hospital or clinic.  If   you will be going home right after the procedure, plan to have someone with you for 24 hours. What happens during the procedure?  An IV tube will be inserted into one of your veins.  Medicine to help you relax (sedative) will be given through the IV tube.  The medical or dental procedure will be performed. What happens after the procedure?  Your blood pressure, heart rate, breathing rate, and blood oxygen level will be monitored often until the medicines you were given have worn off.  Do not drive for 24 hours. This information is not intended to replace advice given to you by your health care provider. Make sure you discuss any questions you have with your health care provider. Document Revised: 04/02/2017 Document Reviewed: 08/10/2015 Elsevier Patient Education  2020 [employer]  

## 2020-04-24 NOTE — Progress Notes (Signed)
Instructed by radiology to clamp foley to enable them to assess for urinary retention

## 2020-04-24 NOTE — Progress Notes (Signed)
Foley cath is in place and clamped on return from IR / suprapubic cath site clean and dry, line taped for reinforcement, Called radiology Patty/ rn charge to see if Dr Grace Isaac wanted urinary foley was to be unclamped and if they were going to remove before DC. PA came and removed urinary foley,

## 2020-04-24 NOTE — H&P (Signed)
Chief Complaint: Patient was seen in consultation today for supra pubic catheter replacement/placement  at the request of FrankPatrick [name]  Referring Physician(s): FrankPatrick [name]  Supervising Physician: Simonne ComeWatts, John  [facility]  History of Present Illness: [name] is a 59 y.o. male   Suffered CVA 2008; all right side deficit  Has lived in SNF since then Has had in/out caths and indwelling foleys multiple times. Secondary so many infections--- SP catheter was placed 2009  Most recent SP catheter was inadvertently pulled out at SNF 6 weeks ago ED was unable to replace SP catheter- [facility] Has had only foley catheter since then  Scheduled now for suprapubic catheter replacement in IR  Past Medical History:  Diagnosis Date  . Anemia   . Chronic indwelling Foley catheter   . Depression   . Diabetes mellitus    Type II  . DVT (deep venous thrombosis) (HCC)   . GERD (gastroesophageal reflux disease)   . Hemiplegia (HCC)   . Hyperlipemia   . Hypertension   . Neurogenic bladder   . OCD (obsessive compulsive disorder)   . Pneumonia   . Seizures (HCC)    since a head injury in high school- no recent sezizures  . Stroke [facility](HCC)    deficit in right arm & right leg    Past Surgical History:  Procedure Laterality Date  . AMPUTATION Left 03/05/2020   Procedure: LEFT ABOVE KNEE AMPUTATION;  Surgeon: Larina EarthlyEarly, Todd F, MD;  [facility];  Service: Vascular;  Laterality: Left;  . EYE SURGERY     cataract removal left eye  . INSERTION OF SUPRAPUBIC CATHETER N/A 06/23/2017   Procedure: INSERTION OF SUPRAPUBIC CATHETER;  Surgeon: Malen GauzeMcKenzie, Patrick L, MD;  Location: [facility] ORS;  Service: Urology;  Laterality: N/A;  . MULTIPLE EXTRACTIONS WITH ALVEOLOPLASTY N/A 05/30/2013   Procedure: MULTIPLE EXTRACTION Teeth number Six, Eight, Nine, Ten, Eleven, Thirteen, Nineteen, and Twenty Eight  WITH ALVEOLOPLASTY;  Surgeon: Georgia LopesScott  M Jensen, DDS;  Location: [facility] OR;  Service: Oral Surgery;  Laterality: N/A;  . MULTIPLE TOOTH EXTRACTIONS    . SUPRAPUBIC CATHETER INSERTION  2009  . TOOTH EXTRACTION N/A 10/16/2016   Procedure: DENTAL RESTORATION/EXTRACTIONS NUMBER FIVE, FOURTEEN, TWENTY-TWO, TWENTY- THREE, TWENTY-FOUR, TWENTY-FIVE, TWENTY-SIX, TWENTY-SEVEN, THIRTY-ONE, AND ALVEOLOPLASTY.;  Surgeon: Ocie DoyneJensen, Scott, DDS;  Location: [facility] OR;  Service: Oral Surgery;  Laterality: N/A;  . TRACHEOSTOMY  2009   also removal of     Allergies: Patient has no known allergies.  Medications: Prior to Admission medications   Medication Sig Start Date End Date Taking? Authorizing Provider  acetaminophen (TYLENOL) 325 MG tablet Take 650 mg by mouth in the morning.    [provider]  acetaminophen (TYLENOL) 500 MG tablet Take 500 mg by mouth every 8 (eight) hours as needed (for breakthrough pain).     [provider]  baclofen (LIORESAL) 10 MG tablet Take 15 mg by mouth 3 (three) times daily.      [provider]  brimonidine (ALPHAGAN P) 0.1 % SOLN Place 1 drop into both eyes 2 (two) times daily.    [provider]  Calcium Carbonate (CALCARB 600 PO) Take 1 tablet by mouth daily with breakfast.    [provider]  CALMOSEPTINE 0.44-20.6 % OINT Apply 1 application topically See admin instructions. Apply to buttocks and gluteal folds every eight hours as needed for protection 06/01/19   [provider]  Cholecalciferol (VITAMIN D3)  50 MCG (2000 UT) TABS Take 2,000 Units by mouth in the morning.    [provider]  clonazePAM (KLONOPIN) 0.5 MG tablet Take 0.5 mg by mouth 2 (two) times daily.  09/05/19   [provider]  Emollient (CETAPHIL MOISTURIZING EX) Apply 1 application topically See admin instructions. Apply to feet and legs every 12 hours as needed for hydration of the skin    [provider]  escitalopram (LEXAPRO) 20 MG tablet Take 20 mg by mouth every  morning.    [provider]  famotidine (PEPCID) 20 MG tablet Take 20 mg by mouth 2 (two) times daily before a meal.     [provider]  ferrous sulfate 325 (65 FE) MG tablet Take 325 mg by mouth 3 (three) times daily with meals.     [provider]  folic acid (FOLVITE) 1 MG tablet Take 1 mg by mouth daily. 06/01/19   [provider]  HYPROMELLOSE OP Place 1 drop into both eyes 2 (two) times daily.    [provider]  insulin glargine (LANTUS) 100 UNIT/ML injection Inject 75 Units into the skin at bedtime.     [provider]  insulin lispro (HUMALOG) 100 UNIT/ML injection Inject 15 Units into the skin See admin instructions. Inject 15 units into the skin three times a day with meals and an additional 2-10 units FOUR TIMES A DAY, per sliding scale: BGL 150-199 = 2 units; 200-249 = 4 units; 250-299 = 6 units; 300-399 = 8 units; and 400-450 = 10 units    [provider]  KEPPRA 500 MG tablet Take 500 mg by mouth See admin instructions. Take 500 mg by mouth at 8 AM, 2 PM, and 8 PM    [provider]  lisinopril (PRINIVIL,ZESTRIL) 2.5 MG tablet Take 2.5 mg by mouth every morning.    [provider]  metFORMIN (GLUCOPHAGE) 1000 MG tablet Take 1,000 mg by mouth 2 (two) times daily.     [provider]  MILK OF MAGNESIA 400 MG/5ML suspension Take 30 mLs by mouth at bedtime as needed (for constipation/no B.M. in 3 days).  06/12/19   [provider]  Multiple Vitamins-Minerals (CERTAGEN) tablet Take 1 tablet by mouth daily.    [provider]  MYRBETRIQ 50 MG TB24 tablet Take 50 mg by mouth daily. 08/28/19   [provider]  oxyCODONE-acetaminophen (PERCOCET/ROXICET) 5-325 MG tablet Take 1-2 tablets by mouth every 4 (four) hours as needed for moderate pain. 03/07/20   Cipriano Bunker, MD  pregabalin (LYRICA) 100 MG capsule Take 100 mg by mouth 3 (three) times daily.     [provider]   rosuvastatin (CRESTOR) 20 MG tablet Take 20 mg by mouth every evening.     [provider]  senna-docusate (SENOKOT-S) 8.6-50 MG tablet Take 1 tablet by mouth at bedtime.     [provider]  vitamin C (ASCORBIC ACID) 500 MG tablet Take 500 mg by mouth in the morning.     [provider]     History reviewed. No pertinent family history.  Social History   Socioeconomic History  . Marital status: Married    Spouse name: Not on file  . Number of children: Not on file  . Years of education: Not on file  . Highest education level: Not on file  Occupational History  . Not on file  Tobacco Use  . Smoking status: Former Games developer  . Smokeless tobacco: Never Used  Substance and Sexual Activity  . Alcohol use: No  . Drug use: No  . Sexual activity: Never  Other Topics Concern  . Not on file  Social History Narrative  . Not on file   Social Determinants of Health   Financial Resource Strain: Not on file  Food Insecurity: Not on file  Transportation Needs: Not on file  Physical Activity: Not on file  Stress: Not on file  Social Connections: Not on file     Review of Systems: A 12 point ROS discussed and pertinent positives are indicated in the HPI above.  All other systems are negative.  Review of Systems  Constitutional: Negative for appetite change and fever.  Respiratory: Negative for cough and shortness of breath.   Cardiovascular: Negative for chest pain.  Gastrointestinal: Negative for abdominal pain and nausea.  Psychiatric/Behavioral: Positive for confusion.    Vital Signs: BP (!) 144/63   Pulse 88   Temp 98.6 F (37 C) (Oral)   Resp 14   Ht 6\' 3"  (1.905 m)   Wt 210 lb 15.7 oz (95.7 kg)   SpO2 98%   BMI 26.37 kg/m   Physical Exam Vitals reviewed.  Cardiovascular:     Rate and Rhythm: Normal rate and regular rhythm.     Heart sounds: Normal heart sounds.  Pulmonary:     Effort: Pulmonary effort is normal.     Breath sounds:  Normal breath sounds.  Abdominal:     Palpations: Abdomen is soft.  Skin:    General: Skin is warm.  Neurological:     Mental Status: Mental status is at baseline.  Psychiatric:     Comments: Wife at bedside She has consented for procedure     Imaging: No results found.  Labs:  CBC: Recent Labs    03/05/20 1007 03/06/20 0041 03/07/20 0045 03/07/20 0930 03/07/20 1825 03/31/20 0403  WBC 10.8* 11.8* 8.4  --   --  7.5  HGB 10.1* 9.2* 7.9* 7.9* 9.1* 10.0*  HCT 32.4* 29.1* 25.4* 26.1* 29.0* 32.8*  PLT 298 300 271  --   --  234    COAGS: Recent Labs    03/05/20 1007  INR 1.1  APTT 35    BMP: Recent Labs    11/11/19 1535 03/05/20 1007 03/06/20 0041 03/07/20 0045 03/31/20 0403  NA 143 132* 133* 132* 133*  K 4.6 4.3 4.5 4.8 4.2  CL 107 97* 98 96* 100  CO2 23 22 24 25 24   GLUCOSE 75 218* 247* 290* 183*  BUN 35* 38* 33* 23* 31*  CALCIUM 8.8* 9.2 8.8* 8.2* 9.3  CREATININE 1.54* 1.29* 1.14 1.09 1.05  GFRNONAA 49* >60 >60 >60 >60  GFRAA 56*  --   --   --   --     LIVER FUNCTION TESTS: Recent Labs    03/05/20 1007  BILITOT 0.6  AST 44*  ALT 86*  ALKPHOS 138*  PROT 7.1  ALBUMIN 2.3*    TUMOR MARKERS: No results for input(s): AFPTM, CEA, CA199, CHROMGRNA in the last 8760 hours.  Assessment and Plan:  CVA 2008 Has lived in SNF since then Multiple In/out caths and indwelling foleys--- changed to SP catheter 2009 Most recent catheter was dislodged accidentally 6 weeks ago Foley catheter in place since then Scheduled for replacement of supra pubic catheter in IR today Pt and wife are aware of procedure benefits and risks-- including but not limited to Infection; bleeding; damage to surrounding structures Agreeable to proceed Consent  signedand in chart  Thank you for this interesting consult.  I greatly enjoyed meeting [name] and look forward to participating in their care.  A copy of this report was sent to the requesting provider on this  date.  Electronically Signed: Robet Leu, PA-C 04/24/2020, 9:25 AM   I spent a total of  30 Minutes   in face to face in clinical consultation, greater than 50% of which was counseling/coordinating care for supra pubic catheter replacement

## 2020-05-10 ENCOUNTER — Telehealth: Payer: Self-pay

## 2020-05-10 ENCOUNTER — Other Ambulatory Visit (HOSPITAL_COMMUNITY): Payer: Self-pay | Admitting: Emergency Medicine

## 2020-05-10 ENCOUNTER — Other Ambulatory Visit (HOSPITAL_COMMUNITY): Payer: Self-pay | Admitting: Interventional Radiology

## 2020-05-10 DIAGNOSIS — G8220 Paraplegia, unspecified: Secondary | ICD-10-CM

## 2020-05-10 NOTE — Telephone Encounter (Signed)
Spoke with Eber Jones from [facility] about pt following back up with [employer] to upsize pts. sp cath.

## 2020-05-23 ENCOUNTER — Telehealth: Payer: Self-pay

## 2020-05-23 NOTE — Telephone Encounter (Signed)
We received a message from Thad Ranger yesterday at [facility] saying pt had pulled out a 14 [place] supra pubic cath and d/t the weather they were unable to bring pt into our office. Stated it was replaced with a reg 14 fr. Cath. Spoke with Dr. Ronne Binning and he said that was fine. Called facility back and left message on Carol Becks vm.

## 2020-06-18 ENCOUNTER — Other Ambulatory Visit: Payer: Self-pay

## 2020-06-18 ENCOUNTER — Ambulatory Visit (HOSPITAL_COMMUNITY)
Admission: RE | Admit: 2020-06-18 | Discharge: 2020-06-18 | Disposition: A | Discharge status: Home / Self Care | Payer: Medicare Other | Source: Ambulatory Visit | Attending: Emergency Medicine | Admitting: Emergency Medicine

## 2020-06-18 DIAGNOSIS — G8220 Paraplegia, unspecified: Secondary | ICD-10-CM | POA: Diagnosis not present

## 2020-06-18 DIAGNOSIS — Z435 Encounter for attention to cystostomy: Secondary | ICD-10-CM | POA: Insufficient documentation

## 2020-06-18 HISTORY — PX: IR CATHETER TUBE CHANGE: IMG717

## 2020-06-18 MED ORDER — IOHEXOL 300 MG/ML  SOLN
50.0000 mL | Freq: Once | INTRAMUSCULAR | Status: AC | PRN
  Administered 2020-06-18: 20 mL

## 2020-06-18 MED ORDER — LIDOCAINE HCL (PF) 1 % IJ SOLN
INTRAMUSCULAR | Status: DC | PRN
  Administered 2020-06-18: 30 mL

## 2020-06-18 MED ORDER — STERILE WATER FOR INJECTION IJ SOLN
INTRAMUSCULAR | Status: AC
  Filled 2020-06-18: qty 10

## 2020-06-18 MED ORDER — LIDOCAINE HCL 1 % IJ SOLN
INTRAMUSCULAR | Status: DC
Start: 2020-06-18 — End: 2020-06-19
  Filled 2020-06-18: qty 20

## 2020-06-18 NOTE — Procedures (Signed)
Interventional Radiology Procedure Note  Procedure: Suprapubic catheter exchange  Findings: Please refer to procedural dictation for full description.  Indwelling Foley in good position.  Successfully exchanged/upsized to 18 Fr Foley.  10 mL sterile water for retention balloon inflation.    Complications: None immediate  Estimated Blood Loss: < 5 mL  Recommendations: Return in 6-8 weeks for check/change, possible upsize.  The patient experienced significant pain during the procedure.  Recommend scheduling with moderate sedation in the future.   Marliss Coots, MD Pager: [phone]

## 2020-07-01 ENCOUNTER — Ambulatory Visit: Payer: Medicare Other | Admitting: Urology

## 2020-07-23 ENCOUNTER — Ambulatory Visit (INDEPENDENT_AMBULATORY_CARE_PROVIDER_SITE_OTHER): Payer: Medicare Other | Admitting: Urology

## 2020-07-23 ENCOUNTER — Other Ambulatory Visit: Payer: Self-pay

## 2020-07-23 VITALS — BP 115/63 | HR 83 | Temp 99.0°F | Ht 73.0 in | Wt 190.0 lb

## 2020-07-23 DIAGNOSIS — N319 Neuromuscular dysfunction of bladder, unspecified: Secondary | ICD-10-CM | POA: Diagnosis not present

## 2020-07-23 MED ORDER — FESOTERODINE FUMARATE ER 8 MG PO TB24: 8.0000 mg | ORAL_TABLET | Freq: Every day | ORAL | 11 refills | Status: DC

## 2020-07-23 NOTE — Progress Notes (Signed)
07/23/2020 1:32 PM   [name] Nov 20, 1960 [id number]  Referring provider: No referring provider defined for this encounter.  Followuop neurogenic bladder  HPI: [name] is a 59yo here for followup for neurogenic bladder with chronic SP tube. NO UTI since last visit. The biggest complaint today is leaking around the SP tube site. SP tube changed monthly. He is currently on mirabegron 50mg  daily.    PMH: Past Medical History:  Diagnosis Date  . Anemia   . Chronic indwelling Foley catheter   . Depression   . Diabetes mellitus    Type II  . DVT (deep venous thrombosis) (HCC)   . GERD (gastroesophageal reflux disease)   . Hemiplegia (HCC)   . Hyperlipemia   . Hypertension   . Neurogenic bladder   . OCD (obsessive compulsive disorder)   . Pneumonia   . Seizures (HCC)    since a head injury in high school- no recent sezizures  . Stroke [name])    deficit in right arm & right leg    Surgical History: Past Surgical History:  Procedure Laterality Date  . AMPUTATION Left 03/05/2020   Procedure: LEFT ABOVE KNEE AMPUTATION;  Surgeon: 13/06/2019, MD;  Location: [facility] OR;  Service: Vascular;  Laterality: Left;  . EYE SURGERY     cataract removal left eye  . INSERTION OF SUPRAPUBIC CATHETER N/A 06/23/2017   Procedure: INSERTION OF SUPRAPUBIC CATHETER;  Surgeon: 06/25/2017, MD;  Location: [facility] ORS;  Service: Urology;  Laterality: N/A;  . IR CATHETER TUBE CHANGE  06/18/2020  . MULTIPLE EXTRACTIONS WITH ALVEOLOPLASTY N/A 05/30/2013   Procedure: MULTIPLE EXTRACTION Teeth number Six, Eight, Nine, Ten, Eleven, Thirteen, Nineteen, and Twenty Eight  WITH ALVEOLOPLASTY;  Surgeon: 06/01/2013, DDS;  Location: [facility] OR;  Service: Oral Surgery;  Laterality: N/A;  . MULTIPLE TOOTH EXTRACTIONS    . SUPRAPUBIC CATHETER INSERTION  2009  . TOOTH EXTRACTION N/A 10/16/2016   Procedure: DENTAL RESTORATION/EXTRACTIONS NUMBER FIVE, FOURTEEN, TWENTY-TWO, TWENTY- THREE, TWENTY-FOUR, TWENTY-FIVE,  TWENTY-SIX, TWENTY-SEVEN, THIRTY-ONE, AND ALVEOLOPLASTY.;  Surgeon: 10/18/2016, DDS;  Location: [facility] OR;  Service: Oral Surgery;  Laterality: N/A;  . TRACHEOSTOMY  2009   also removal of     Home Medications:  Allergies as of 07/23/2020   No Known Allergies     Medication List       Accurate as of July 23, 2020  1:32 PM. If you have any questions, ask your nurse or doctor.        acetaminophen 500 MG tablet Commonly known as: TYLENOL Take 500 mg by mouth every 8 (eight) hours as needed (for breakthrough pain).   acetaminophen 325 MG tablet Commonly known as: TYLENOL Take 650 mg by mouth in the morning.   Baclofen 5 MG Tabs Take 3 tablets by mouth 3 (three) times daily as needed. What changed: Another medication with the same name was removed. Continue taking this medication, and follow the directions you see here. Changed by: July 25, 2020, MD   brimonidine 0.1 % Soln Commonly known as: ALPHAGAN P Place 1 drop into both eyes 2 (two) times daily.   CALCARB 600 PO Take 1 tablet by mouth daily with breakfast.   Calmoseptine 0.44-20.6 % Oint Generic drug: Menthol-Zinc Oxide Apply 1 application topically See admin instructions. Apply to buttocks and gluteal folds every eight hours as needed for protection   Certagen tablet Take 1 tablet by mouth daily.   CETAPHIL MOISTURIZING EX Apply 1 application topically See  admin instructions. Apply to feet and legs every 12 hours as needed for hydration of the skin   clonazePAM 0.5 MG tablet Commonly known as: KLONOPIN Take 0.5 mg by mouth 2 (two) times daily.   escitalopram 20 MG tablet Commonly known as: LEXAPRO Take 20 mg by mouth every morning.   famotidine 20 MG tablet Commonly known as: PEPCID Take 20 mg by mouth 2 (two) times daily before a meal.   ferrous sulfate 325 (65 FE) MG tablet Take 325 mg by mouth 3 (three) times daily with meals.   folic acid 1 MG tablet Commonly known as: FOLVITE Take 1 mg by  mouth daily.   HYDROcodone-acetaminophen 5-325 MG tablet Commonly known as: NORCO/VICODIN Take by mouth.   HYPROMELLOSE OP Place 1 drop into both eyes 2 (two) times daily.   insulin glargine 100 UNIT/ML injection Commonly known as: LANTUS Inject 75 Units into the skin at bedtime.   insulin lispro 100 UNIT/ML injection Commonly known as: HUMALOG Inject 15 Units into the skin See admin instructions. Inject 15 units into the skin three times a day with meals and an additional 2-10 units FOUR TIMES A DAY, per sliding scale: BGL 150-199 = 2 units; 200-249 = 4 units; 250-299 = 6 units; 300-399 = 8 units; and 400-450 = 10 units   Keppra 500 MG tablet Generic drug: levETIRAcetam Take 500 mg by mouth See admin instructions. Take 500 mg by mouth at 8 AM, 2 PM, and 8 PM   lisinopril 2.5 MG tablet Commonly known as: ZESTRIL Take 2.5 mg by mouth every morning.   metFORMIN 1000 MG tablet Commonly known as: GLUCOPHAGE Take 1,000 mg by mouth 2 (two) times daily.   Milk of Magnesia 400 MG/5ML suspension Generic drug: magnesium hydroxide Take 30 mLs by mouth at bedtime as needed (for constipation/no B.M. in 3 days).   Myrbetriq 50 MG Tb24 tablet Generic drug: mirabegron ER Take 50 mg by mouth daily.   oxyCODONE-acetaminophen 5-325 MG tablet Commonly known as: PERCOCET/ROXICET Take 1-2 tablets by mouth every 4 (four) hours as needed for moderate pain.   pregabalin 100 MG capsule Commonly known as: LYRICA Take 100 mg by mouth 3 (three) times daily.   rosuvastatin 20 MG tablet Commonly known as: CRESTOR Take 20 mg by mouth every evening.   Santyl ointment Generic drug: collagenase Apply topically.   senna-docusate 8.6-50 MG tablet Commonly known as: Senokot-S Take 1 tablet by mouth at bedtime.   vitamin C 500 MG tablet Commonly known as: ASCORBIC ACID Take 500 mg by mouth in the morning.   Vitamin D3 50 MCG (2000 UT) Tabs Take 2,000 Units by mouth in the morning.        Allergies: No Known Allergies  Family History: No family history on file.  Social History:  reports that he has quit smoking. He has never used smokeless tobacco. He reports that he does not drink alcohol and does not use drugs.  ROS: All other review of systems were reviewed and are negative except what is noted above in HPI  Physical Exam: BP 115/63   Pulse 83   Temp 99 F (37.2 C)   Ht 6\' 1"  (1.854 m)   Wt 190 lb (86.2 kg)   BMI 25.07 kg/m   Constitutional:  Alert and oriented, No acute distress. HEENT: [place] AT, moist mucus membranes.  Trachea midline, no masses. Cardiovascular: No clubbing, cyanosis, or edema. Respiratory: Normal respiratory effort, no increased work of breathing. GI: Abdomen is soft,  nontender, nondistended, no abdominal masses GU: No CVA tenderness.  Lymph: No cervical or inguinal lymphadenopathy. Skin: No rashes, bruises or suspicious lesions. Neurologic: Grossly intact, no focal deficits, moving all 4 extremities. Psychiatric: Normal mood and affect.  Laboratory Data: Lab Results  Component Value Date   WBC 7.5 03/31/2020   HGB 10.0 (L) 03/31/2020   HCT 32.8 (L) 03/31/2020   MCV 85.9 03/31/2020   PLT 234 03/31/2020    Lab Results  Component Value Date   CREATININE 1.05 03/31/2020    No results found for: PSA  No results found for: TESTOSTERONE  Lab Results  Component Value Date   HGBA1C 8.0 (H) 03/05/2020    Urinalysis    Component Value Date/Time   COLORURINE YELLOW 03/05/2020 1017   APPEARANCEUR HAZY (A) 03/05/2020 1017   LABSPEC 1.012 03/05/2020 1017   PHURINE 7.0 03/05/2020 1017   GLUCOSEU 50 (A) 03/05/2020 1017   HGBUR SMALL (A) 03/05/2020 1017   BILIRUBINUR NEGATIVE 03/05/2020 1017   KETONESUR NEGATIVE 03/05/2020 1017   PROTEINUR 30 (A) 03/05/2020 1017   UROBILINOGEN 0.2 06/17/2007 1435   NITRITE NEGATIVE 03/05/2020 1017   LEUKOCYTESUR LARGE (A) 03/05/2020 1017    Lab Results  Component Value Date   BACTERIA  FEW (A) 03/05/2020    Pertinent Imaging:  No results found for this or any previous visit.  No results found for this or any previous visit.  No results found for this or any previous visit.  No results found for this or any previous visit.  Results for orders placed during the hospital encounter of 03/29/17  US RENAL  Narrative CLINICAL DATA:  Neuropathic bladder.  EXAM: RENAL / URINARY TRACT ULTRASOUND COMPLETE  COMPARISON:  November 14, 2015  FINDINGS: Right Kidney:  Length: 11.8 cm. The right kidney is poorly visualized due to shadowing bowel gas. However, there is suggestion of caliectasis in the upper pole.  Left Kidney:  Length: 12.7 cm. Echogenicity within normal limits. No mass or hydronephrosis visualized.  Bladder:  Decompressed with a Foley catheter.  IMPRESSION: 1. The right kidney is poorly visualized. However, there is suggested caliectasis in the upper pole on image 3 of 34. 2. The left kidney is normal in appearance. 3. The bladder is decompressed with a Foley catheter.   Electronically Signed By: Gerome Samavid  Williams III M.D On: 03/29/2017 16:30  No results found for this or any previous visit.  No results found for this or any previous visit.  Results for orders placed during the hospital encounter of 12/18/17  CT Renal Stone Study  Narrative CLINICAL DATA:  Patient's catheter was manipulated with no urine in the bag period after manipulation, patient started passing blood and bleeding from the penis. History of neurogenic bladder.  EXAM: CT ABDOMEN AND PELVIS WITHOUT CONTRAST  TECHNIQUE: Multidetector CT imaging of the abdomen and pelvis was performed following the standard protocol without IV contrast.  COMPARISON:  None.  FINDINGS: Lower chest: Atelectasis in the lung bases. Coronary artery calcifications.  Hepatobiliary: No focal liver abnormality is seen. No gallstones, gallbladder wall thickening, or biliary  dilatation.  Pancreas: Unremarkable. No pancreatic ductal dilatation or surrounding inflammatory changes.  Spleen: Normal in size without focal abnormality.  Adrenals/Urinary Tract: No adrenal gland nodules. Kidneys are symmetrical in size. No renal or ureteral stones are seen. There is hydronephrosis and hydroureter on the right with stranding around the right kidney and ureter and with right ureteral wall thickening. In the absence of obvious stones, this could represent  obstruction due to an occult non radiopaque stone, reflux with pyelonephritis, or distal stricture. Left kidney and ureter are unremarkable. There is a suprapubic catheter passing through the bladder with the balloon demonstrated in the posterior urethra distal to the prostate gland. The bladder is incompletely distended. Urine within the bladder is heterogeneous and hyperdense, likely representing hemorrhage.  Stomach/Bowel: Stomach, small bowel, and colon are not abnormally distended. Stool diffusely throughout the colon. Prominent stool in the rectum without significant rectal wall thickening. No inflammatory changes are appreciated. Appendix is normal.  Vascular/Lymphatic: Aortic atherosclerosis. No enlarged abdominal or pelvic lymph nodes.  Reproductive: Prostate is unremarkable. Calcification in the seminal vesicles.  Other: No free air or free fluid in the abdomen.  Musculoskeletal: Degenerative changes in the spine. No destructive bone lesions.  IMPRESSION: 1. Suprapubic catheter extends through the bladder and urethra with the balloon in the posterior urethra distal to the prostate gland. 2. Bladder is incompletely distended. Urine within the bladder is heterogeneous and hyperdense, likely representing hemorrhage. 3. Right hydronephrosis and hydroureter with stranding around the right kidney and ureter. In the absence of obvious stones, this could represent obstruction due to an occult non  radiopaque stone, reflux with pyelonephritis, or distal stricture. 4. Aortic atherosclerosis.   Electronically Signed By: Burman Nieves M.D. On: 12/18/2017 02:23   Assessment & Plan:    1. Neurogenic bladder -We will change mirabegron 50mg  to toviaz 8mg . If this failed to improve his incontinence then we will likely porceed with intravesical botox   No follow-ups on file.  , MD  [facility] [place]

## 2020-07-23 NOTE — Progress Notes (Signed)
Urological Symptom Review  Patient is experiencing the following symptoms: none   Review of Systems  Gastrointestinal (upper)  : Negative for upper GI symptoms  Gastrointestinal (lower) : Negative for lower GI symptoms  Constitutional : Negative for symptoms  Skin: Negative for skin symptoms  Eyes: Negative for eye symptoms  Ear/Nose/Throat : Negative for Ear/Nose/Throat symptoms  Hematologic/Lymphatic: Negative for Hematologic/Lymphatic symptoms  Cardiovascular : Negative for cardiovascular symptoms  Respiratory : Negative for respiratory symptoms  Endocrine: negative  Musculoskeletal: Negative for musculoskeletal symptoms  Neurological: Negative for neurological symptoms  Psychologic: Negative for psychiatric symptoms

## 2020-07-30 ENCOUNTER — Encounter: Payer: Self-pay | Admitting: Urology

## 2020-07-30 NOTE — Patient Instructions (Signed)

## 2020-08-02 ENCOUNTER — Telehealth: Payer: Self-pay | Admitting: Urology

## 2020-08-02 NOTE — Telephone Encounter (Signed)
[name] called from [facility] and said his insurance won't cover his [name]. She needs someone to call her to go over the list of meds that his insurance will cover.

## 2020-08-02 NOTE — Telephone Encounter (Signed)
Pts nurse called saying Toviax ordered for him was not covered under insurance. She wanted a call back to tell us some it would cover. I called and got voicemail and left message.

## 2020-08-05 ENCOUNTER — Other Ambulatory Visit: Payer: Self-pay

## 2020-08-05 DIAGNOSIS — N319 Neuromuscular dysfunction of bladder, unspecified: Secondary | ICD-10-CM

## 2020-08-05 MED ORDER — SOLIFENACIN SUCCINATE 10 MG PO TABS: 10.0000 mg | ORAL_TABLET | Freq: Every day | ORAL | 3 refills | Status: AC

## 2020-08-05 NOTE — Telephone Encounter (Signed)
Med sent in and message left for nurse at facility that med sent in.

## 2020-08-05 NOTE — Telephone Encounter (Signed)
Vesicare 10 mg daily 

## 2020-08-05 NOTE — Progress Notes (Signed)
vesicr

## 2020-08-12 ENCOUNTER — Telehealth: Payer: Self-pay

## 2020-08-20 NOTE — Telephone Encounter (Signed)
Kayla returned phone call about his meds and I let her know that Dr. Ronne Binning said that pt needs to be on both myrbetriq and vesicare.

## 2020-10-23 ENCOUNTER — Ambulatory Visit: Payer: Medicare Other | Admitting: Urology

## 2020-10-23 DIAGNOSIS — N319 Neuromuscular dysfunction of bladder, unspecified: Secondary | ICD-10-CM

## 2020-11-01 DEATH — deceased

## 2021-02-09 IMAGING — MR MR FOOT*L* WO/W CM
4 of 8 series · 19 of 40 positions shown · IV contrast (gadavist)
Comparison: None

CLINICAL DATA: Diabetic foot ulcer.

EXAM:
MRI OF THE LEFT FOREFOOT WITHOUT AND WITH CONTRAST
TECHNIQUE: Multiplanar, multisequence MR imaging of the left foot was performed
both before and after administration of intravenous contrast.
CONTRAST:  8mL GADAVIST GADOBUTROL 1 MMOL/ML IV SOLN

[Series 3: T1 · oblique · 3.0mm · 0.31mm/px · 5 of 32 slices shown (1 of 2)]
[im 1/32]
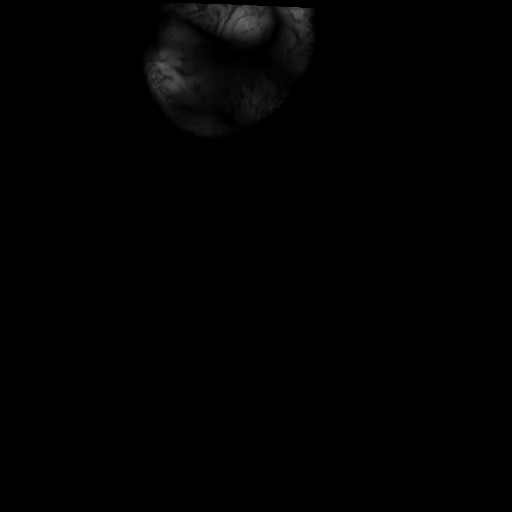
[im 8/32]
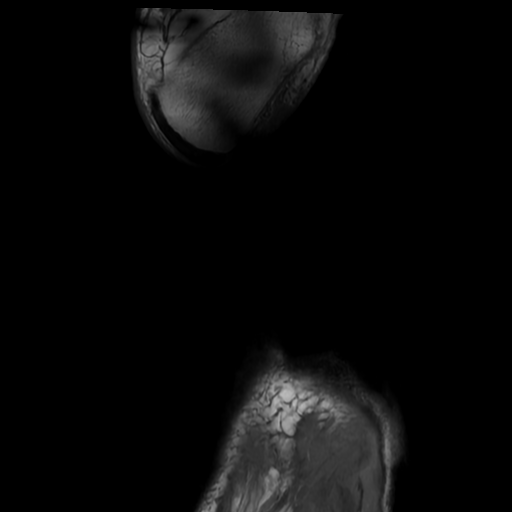
[im 16/32]
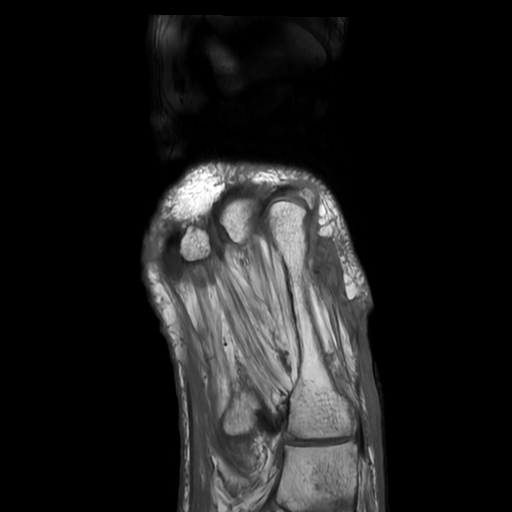
[im 24/32]
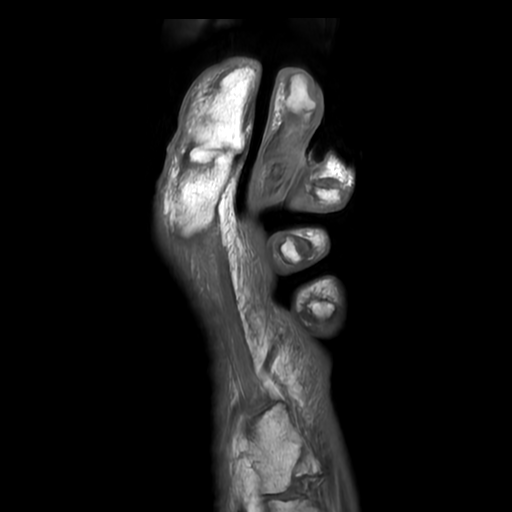
[im 32/32]
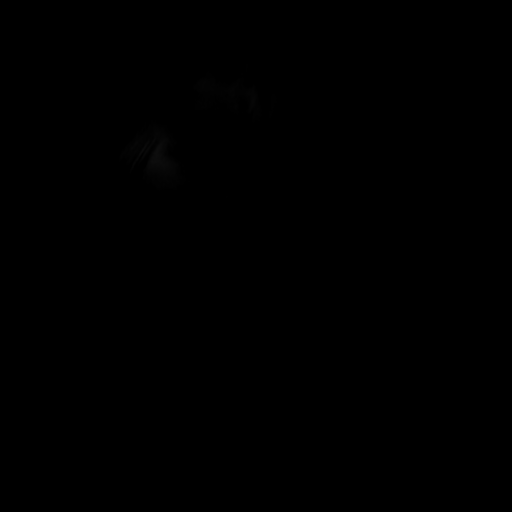

[Series 4: T2 fat-sat · oblique · 3.0mm · 0.31mm/px · 5 of 32 slices shown]
[im 1/32]
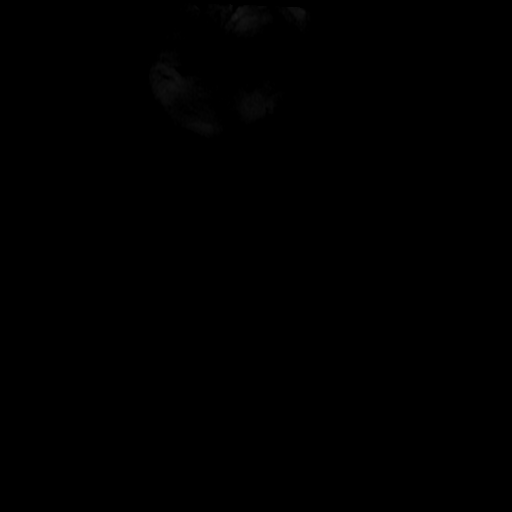
[im 8/32]
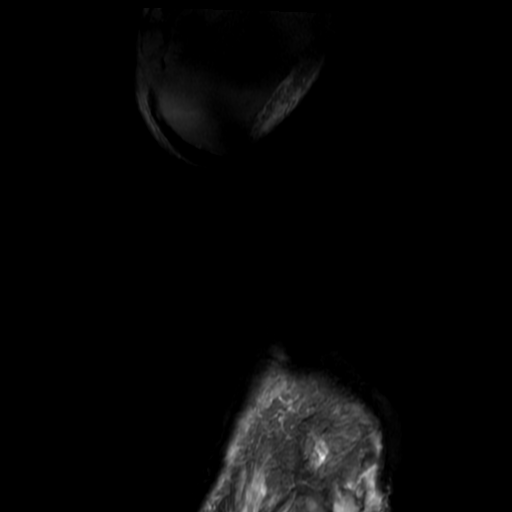
[im 16/32]
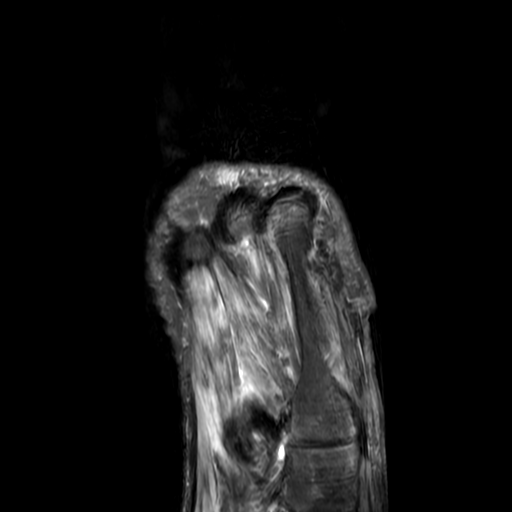
[im 24/32]
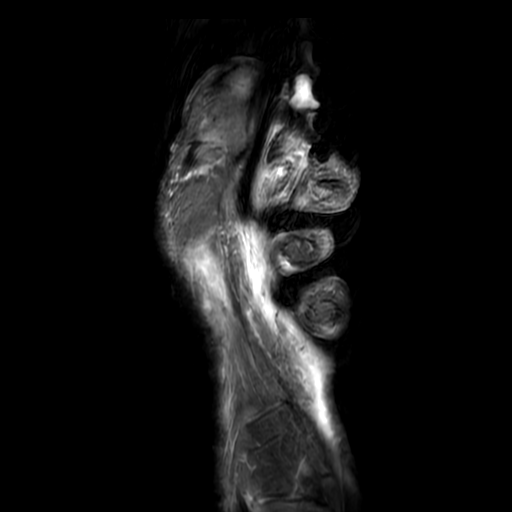
[im 32/32]
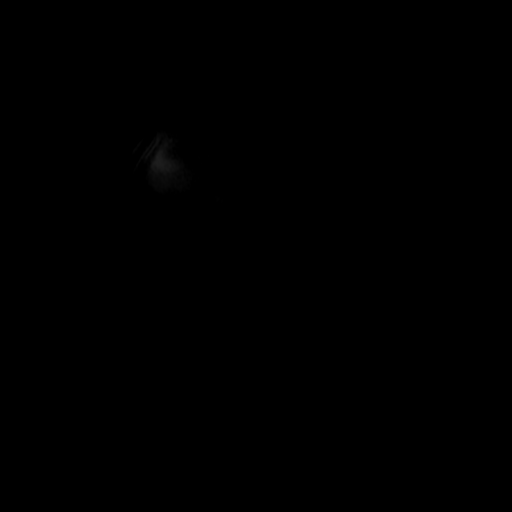

[Series 5: T1 fat-sat · oblique · non-contrast · 3.0mm · 0.31mm/px · 4 of 32 slices shown]
[im 1/32]
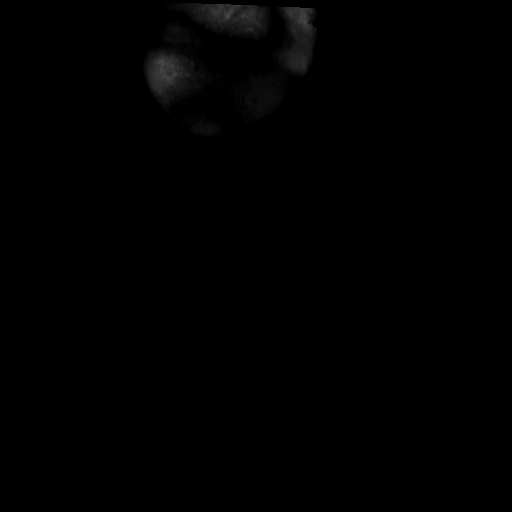
[im 11/32]
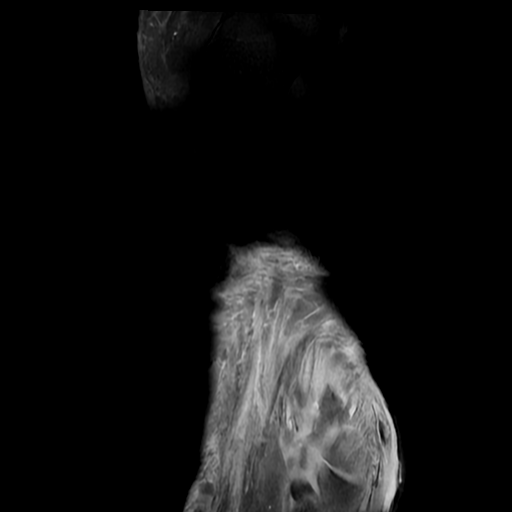
[im 21/32]
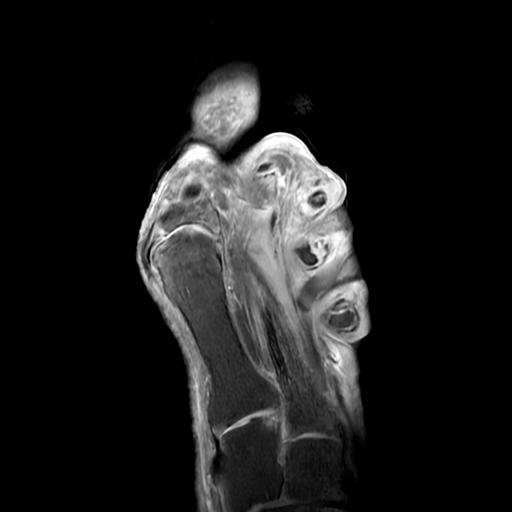
[im 32/32]
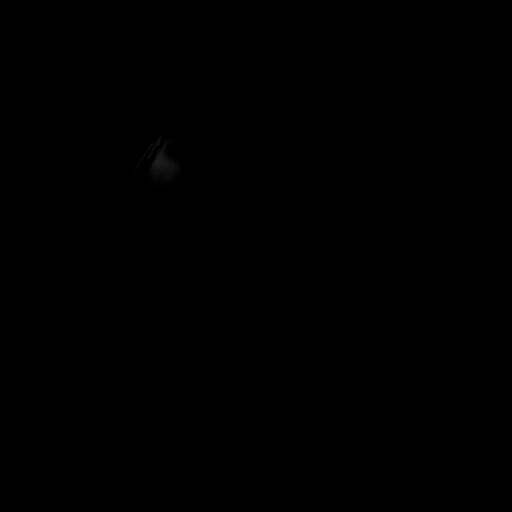

[Series 8: T1 · coronal · 3.0mm · 0.27mm/px · 5 of 45 slices shown (2 of 2)]
[im 1/45]
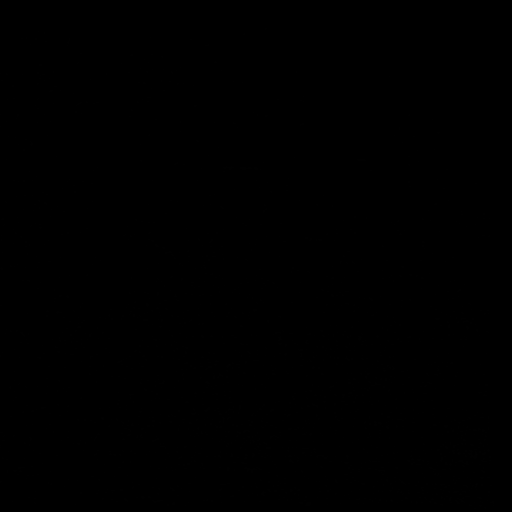
[im 9/45]
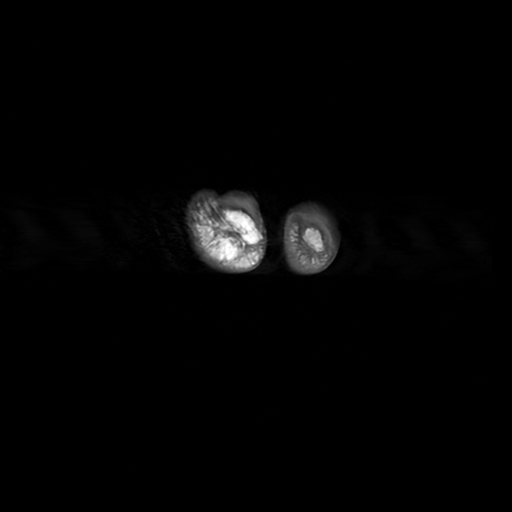
[im 18/45]
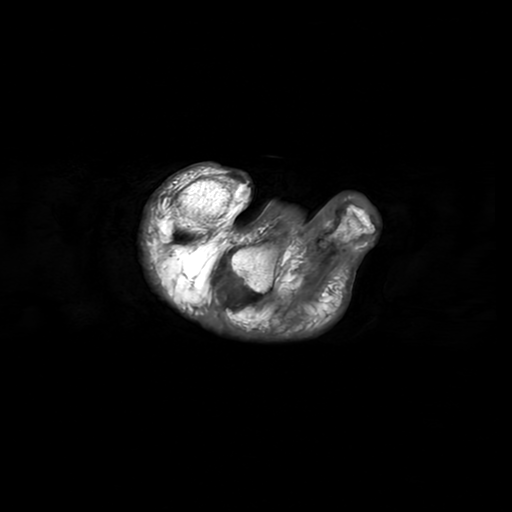
[im 27/45]
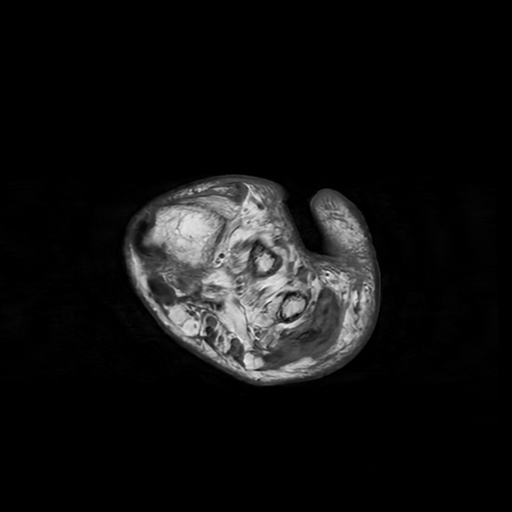
[im 45/45]
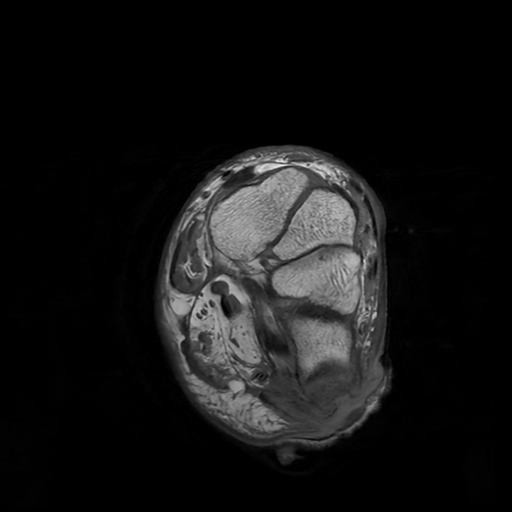

[19 of 40 positions shown; findings below may reference images not displayed]

FINDINGS: There is a deep open wound along the lateral aspect of the foot at
the level of the fifth metatarsal base near the previous amputation
site. The wound appears to extend right down to the bone and there
is underlying osteomyelitis. I do not see any definite findings for
septic arthritis at the fifth metatarsal base articulation with the
cuboid. No discrete drainable soft tissue abscess is identified.

There is also a wound involving the PIP joint region of the second
toe with underlying septic arthritis at the PIP joint and
osteomyelitis involving the proximal and middle phalanges.

Mild diffuse cellulitis and myofasciitis without definite findings
for pyomyositis.

Degenerative changes involving the midfoot and forefoot but no
worrisome bone lesions or other sites of osteomyelitis.
IMPRESSION: 1. Deep open wound along the lateral aspect of the foot at the level
of the fifth metatarsal base near the previous amputation site.
There is underlying osteomyelitis involving the fifth metatarsal
base.
2. Osteomyelitis involving the proximal and middle phalanges of the
second toe and PIP joint septic arthritis.
3. Mild diffuse cellulitis and myofasciitis without definite
findings for pyomyositis.
